# Patient Record
Sex: Female | Born: 1940 | Race: White | Hispanic: No | Smoking: Former smoker
Health system: Southern US, Community
[De-identification: ages and names within clinical notes are randomized; demographics above are authoritative.]

## PROBLEM LIST (undated history)

## (undated) DIAGNOSIS — E785 Hyperlipidemia, unspecified: Secondary | ICD-10-CM

## (undated) DIAGNOSIS — I82409 Acute embolism and thrombosis of unspecified deep veins of unspecified lower extremity: Secondary | ICD-10-CM

## (undated) DIAGNOSIS — R350 Frequency of micturition: Secondary | ICD-10-CM

## (undated) DIAGNOSIS — K5900 Constipation, unspecified: Secondary | ICD-10-CM

## (undated) DIAGNOSIS — F0390 Unspecified dementia without behavioral disturbance: Secondary | ICD-10-CM

## (undated) DIAGNOSIS — R55 Syncope and collapse: Secondary | ICD-10-CM

## (undated) DIAGNOSIS — I639 Cerebral infarction, unspecified: Secondary | ICD-10-CM

## (undated) DIAGNOSIS — I10 Essential (primary) hypertension: Secondary | ICD-10-CM

## (undated) DIAGNOSIS — I2510 Atherosclerotic heart disease of native coronary artery without angina pectoris: Secondary | ICD-10-CM

## (undated) DIAGNOSIS — R32 Unspecified urinary incontinence: Secondary | ICD-10-CM

## (undated) HISTORY — DX: Cerebral infarction, unspecified: I63.9

## (undated) HISTORY — DX: Hyperlipidemia, unspecified: E78.5

## (undated) HISTORY — PX: CORONARY ANGIOPLASTY WITH STENT PLACEMENT: SHX49

## (undated) HISTORY — DX: Essential (primary) hypertension: I10

## (undated) HISTORY — PX: OTHER SURGICAL HISTORY: SHX169

## (undated) HISTORY — DX: Atherosclerotic heart disease of native coronary artery without angina pectoris: I25.10

---

## 1998-04-01 ENCOUNTER — Ambulatory Visit (HOSPITAL_COMMUNITY): Admission: RE | Admit: 1998-04-01 | Discharge: 1998-04-01 | Payer: Self-pay | Admitting: Family Medicine

## 1998-04-01 ENCOUNTER — Encounter: Payer: Self-pay | Admitting: Family Medicine

## 1998-05-01 DIAGNOSIS — I2510 Atherosclerotic heart disease of native coronary artery without angina pectoris: Secondary | ICD-10-CM

## 1998-05-01 HISTORY — DX: Atherosclerotic heart disease of native coronary artery without angina pectoris: I25.10

## 1999-04-14 ENCOUNTER — Ambulatory Visit (HOSPITAL_COMMUNITY): Admission: RE | Admit: 1999-04-14 | Discharge: 1999-04-15 | Payer: Self-pay | Admitting: Cardiology

## 1999-09-15 ENCOUNTER — Encounter: Payer: Self-pay | Admitting: *Deleted

## 1999-09-15 ENCOUNTER — Ambulatory Visit (HOSPITAL_COMMUNITY): Admission: RE | Admit: 1999-09-15 | Discharge: 1999-09-15 | Payer: Self-pay | Admitting: *Deleted

## 1999-09-21 ENCOUNTER — Ambulatory Visit (HOSPITAL_COMMUNITY): Admission: RE | Admit: 1999-09-21 | Discharge: 1999-09-21 | Payer: Self-pay | Admitting: *Deleted

## 1999-09-21 ENCOUNTER — Encounter: Payer: Self-pay | Admitting: *Deleted

## 1999-10-10 ENCOUNTER — Other Ambulatory Visit: Admission: RE | Admit: 1999-10-10 | Discharge: 1999-10-10 | Payer: Self-pay | Admitting: Obstetrics and Gynecology

## 1999-10-27 ENCOUNTER — Encounter (INDEPENDENT_AMBULATORY_CARE_PROVIDER_SITE_OTHER): Payer: Self-pay | Admitting: Specialist

## 1999-10-27 ENCOUNTER — Ambulatory Visit (HOSPITAL_COMMUNITY): Admission: RE | Admit: 1999-10-27 | Discharge: 1999-10-27 | Payer: Self-pay | Admitting: Obstetrics and Gynecology

## 2000-01-10 ENCOUNTER — Encounter: Payer: Self-pay | Admitting: Emergency Medicine

## 2000-01-10 ENCOUNTER — Emergency Department (HOSPITAL_COMMUNITY): Admission: EM | Admit: 2000-01-10 | Discharge: 2000-01-10 | Payer: Self-pay | Admitting: Emergency Medicine

## 2000-03-19 ENCOUNTER — Encounter: Admission: RE | Admit: 2000-03-19 | Discharge: 2000-03-19 | Payer: Self-pay | Admitting: *Deleted

## 2000-03-19 ENCOUNTER — Encounter: Payer: Self-pay | Admitting: *Deleted

## 2000-12-26 ENCOUNTER — Encounter: Payer: Self-pay | Admitting: Family Medicine

## 2000-12-26 ENCOUNTER — Ambulatory Visit (HOSPITAL_COMMUNITY): Admission: RE | Admit: 2000-12-26 | Discharge: 2000-12-26 | Payer: Self-pay | Admitting: Family Medicine

## 2001-04-10 ENCOUNTER — Ambulatory Visit (HOSPITAL_COMMUNITY): Admission: RE | Admit: 2001-04-10 | Discharge: 2001-04-10 | Payer: Self-pay | Admitting: Family Medicine

## 2001-04-10 ENCOUNTER — Encounter: Payer: Self-pay | Admitting: Family Medicine

## 2002-06-30 ENCOUNTER — Other Ambulatory Visit: Admission: RE | Admit: 2002-06-30 | Discharge: 2002-06-30 | Payer: Self-pay | Admitting: Family Medicine

## 2002-07-10 ENCOUNTER — Ambulatory Visit (HOSPITAL_COMMUNITY): Admission: RE | Admit: 2002-07-10 | Discharge: 2002-07-10 | Payer: Self-pay | Admitting: Family Medicine

## 2002-07-10 ENCOUNTER — Encounter: Payer: Self-pay | Admitting: Family Medicine

## 2004-07-11 ENCOUNTER — Ambulatory Visit (HOSPITAL_COMMUNITY): Admission: RE | Admit: 2004-07-11 | Discharge: 2004-07-11 | Payer: Self-pay | Admitting: Family Medicine

## 2005-04-17 ENCOUNTER — Other Ambulatory Visit: Admission: RE | Admit: 2005-04-17 | Discharge: 2005-04-17 | Payer: Self-pay | Admitting: Family Medicine

## 2005-05-16 ENCOUNTER — Encounter (INDEPENDENT_AMBULATORY_CARE_PROVIDER_SITE_OTHER): Payer: Self-pay | Admitting: *Deleted

## 2005-05-16 ENCOUNTER — Ambulatory Visit (HOSPITAL_BASED_OUTPATIENT_CLINIC_OR_DEPARTMENT_OTHER): Admission: RE | Admit: 2005-05-16 | Discharge: 2005-05-16 | Payer: Self-pay | Admitting: Otolaryngology

## 2006-07-04 ENCOUNTER — Ambulatory Visit: Admission: RE | Admit: 2006-07-04 | Discharge: 2006-07-04 | Payer: Self-pay | Admitting: Family Medicine

## 2006-07-04 ENCOUNTER — Ambulatory Visit: Payer: Self-pay | Admitting: Vascular Surgery

## 2006-08-13 ENCOUNTER — Ambulatory Visit: Payer: Self-pay | Admitting: Vascular Surgery

## 2006-11-12 ENCOUNTER — Ambulatory Visit: Payer: Self-pay | Admitting: Vascular Surgery

## 2006-12-12 ENCOUNTER — Ambulatory Visit: Payer: Self-pay | Admitting: Vascular Surgery

## 2006-12-19 ENCOUNTER — Ambulatory Visit: Payer: Self-pay | Admitting: Vascular Surgery

## 2007-02-13 ENCOUNTER — Ambulatory Visit: Payer: Self-pay | Admitting: Vascular Surgery

## 2007-02-20 ENCOUNTER — Ambulatory Visit: Payer: Self-pay | Admitting: Vascular Surgery

## 2007-04-05 ENCOUNTER — Ambulatory Visit: Payer: Self-pay | Admitting: Vascular Surgery

## 2008-05-01 DIAGNOSIS — I639 Cerebral infarction, unspecified: Secondary | ICD-10-CM

## 2008-05-01 HISTORY — DX: Cerebral infarction, unspecified: I63.9

## 2008-05-16 ENCOUNTER — Encounter (INDEPENDENT_AMBULATORY_CARE_PROVIDER_SITE_OTHER): Payer: Self-pay

## 2008-05-16 ENCOUNTER — Inpatient Hospital Stay (HOSPITAL_COMMUNITY): Admission: EM | Admit: 2008-05-16 | Discharge: 2008-05-20 | Payer: Self-pay | Admitting: Emergency Medicine

## 2008-05-18 ENCOUNTER — Encounter (INDEPENDENT_AMBULATORY_CARE_PROVIDER_SITE_OTHER): Payer: Self-pay

## 2008-05-18 ENCOUNTER — Ambulatory Visit: Payer: Self-pay | Admitting: Physical Medicine & Rehabilitation

## 2008-05-19 ENCOUNTER — Encounter: Payer: Self-pay | Admitting: Cardiovascular Disease

## 2008-05-20 ENCOUNTER — Ambulatory Visit: Payer: Self-pay | Admitting: Physical Medicine & Rehabilitation

## 2008-05-20 ENCOUNTER — Inpatient Hospital Stay (HOSPITAL_COMMUNITY)
Admission: RE | Admit: 2008-05-20 | Discharge: 2008-05-28 | Payer: Self-pay | Admitting: Physical Medicine & Rehabilitation

## 2008-06-04 ENCOUNTER — Encounter
Admission: RE | Admit: 2008-06-04 | Discharge: 2008-09-02 | Payer: Self-pay | Admitting: Physical Medicine & Rehabilitation

## 2008-07-28 ENCOUNTER — Encounter
Admission: RE | Admit: 2008-07-28 | Discharge: 2008-08-03 | Payer: Self-pay | Admitting: Physical Medicine & Rehabilitation

## 2008-08-03 ENCOUNTER — Ambulatory Visit: Payer: Self-pay | Admitting: Physical Medicine & Rehabilitation

## 2008-09-25 ENCOUNTER — Encounter: Admission: RE | Admit: 2008-09-25 | Discharge: 2008-09-25 | Payer: Self-pay | Admitting: Orthopedic Surgery

## 2008-09-28 ENCOUNTER — Emergency Department (HOSPITAL_COMMUNITY): Admission: EM | Admit: 2008-09-28 | Discharge: 2008-09-28 | Payer: Self-pay | Admitting: Emergency Medicine

## 2008-12-25 ENCOUNTER — Ambulatory Visit (HOSPITAL_COMMUNITY): Admission: RE | Admit: 2008-12-25 | Discharge: 2008-12-25 | Payer: Self-pay | Admitting: Orthopedic Surgery

## 2009-12-01 ENCOUNTER — Encounter: Admission: RE | Admit: 2009-12-01 | Discharge: 2009-12-01 | Payer: Self-pay | Admitting: Neurology

## 2010-07-20 ENCOUNTER — Telehealth: Payer: Self-pay | Admitting: *Deleted

## 2010-07-20 NOTE — Telephone Encounter (Signed)
Pt called and requesting a 56month OV.  This was made for 08/12/10 @1330 

## 2010-07-22 ENCOUNTER — Other Ambulatory Visit: Payer: Self-pay | Admitting: *Deleted

## 2010-07-22 DIAGNOSIS — E785 Hyperlipidemia, unspecified: Secondary | ICD-10-CM

## 2010-07-22 DIAGNOSIS — I10 Essential (primary) hypertension: Secondary | ICD-10-CM

## 2010-07-22 MED ORDER — METOPROLOL TARTRATE 25 MG PO TABS: 25.0000 mg | ORAL_TABLET | Freq: Two times a day (BID) | ORAL | Status: DC

## 2010-07-22 MED ORDER — ATORVASTATIN CALCIUM 40 MG PO TABS: 40.0000 mg | ORAL_TABLET | Freq: Every day | ORAL | Status: DC

## 2010-07-22 NOTE — Telephone Encounter (Signed)
REFILL PER FAX FROM PHARMACY  

## 2010-07-29 ENCOUNTER — Other Ambulatory Visit: Payer: Self-pay | Admitting: Cardiovascular Disease

## 2010-07-29 DIAGNOSIS — E7800 Pure hypercholesterolemia, unspecified: Secondary | ICD-10-CM

## 2010-07-29 DIAGNOSIS — I10 Essential (primary) hypertension: Secondary | ICD-10-CM

## 2010-08-06 LAB — CBC
HCT: 43.3 % (ref 36.0–46.0)
Platelets: 248 10*3/uL (ref 150–400)
RBC: 4.4 MIL/uL (ref 3.87–5.11)
RDW: 13.1 % (ref 11.5–15.5)

## 2010-08-06 LAB — URINE MICROSCOPIC-ADD ON

## 2010-08-06 LAB — BASIC METABOLIC PANEL
Creatinine, Ser: 0.64 mg/dL (ref 0.4–1.2)
GFR calc Af Amer: >60 mL/min (ref >60)
Glucose, Bld: 96 mg/dL (ref 70–99)
Potassium: 4.6 mEq/L (ref 3.5–5.1)
Sodium: 142 mEq/L (ref 135–145)

## 2010-08-06 LAB — URINALYSIS, ROUTINE W REFLEX MICROSCOPIC
Ketones, ur: NEGATIVE mg/dL
Nitrite: NEGATIVE
Specific Gravity, Urine: 1.03 (ref 1.005–1.030)
Urobilinogen, UA: 0.2 mg/dL (ref 0.0–1.0)
pH: 5 (ref 5.0–8.0)

## 2010-08-06 LAB — DIFFERENTIAL
Basophils Absolute: 0.1 10*3/uL (ref 0.0–0.1)
Basophils Relative: 1 % (ref 0–1)
Eosinophils Absolute: 0.2 10*3/uL (ref 0.0–0.7)
Lymphs Abs: 2.4 10*3/uL (ref 0.7–4.0)
Neutrophils Relative %: 60 % (ref 43–77)

## 2010-08-09 LAB — CBC
MCHC: 34.5 g/dL (ref 30.0–36.0)
MCV: 95.5 fL (ref 78.0–100.0)
WBC: 7.8 10*3/uL (ref 4.0–10.5)

## 2010-08-09 LAB — POCT CARDIAC MARKERS
CKMB, poc: 2.1 ng/mL (ref 1.0–8.0)
Myoglobin, poc: 73.5 ng/mL (ref 12–200)
Troponin i, poc: <0.05 ng/mL (ref 0.00–0.09)

## 2010-08-09 LAB — DIFFERENTIAL
Basophils Absolute: 0 10*3/uL (ref 0.0–0.1)
Basophils Relative: 0 % (ref 0–1)
Monocytes Absolute: 0.4 10*3/uL (ref 0.1–1.0)
Neutrophils Relative %: 74 % (ref 43–77)

## 2010-08-09 LAB — BASIC METABOLIC PANEL
Calcium: 9.5 mg/dL (ref 8.4–10.5)
Chloride: 107 mEq/L (ref 96–112)
GFR calc Af Amer: >60 mL/min (ref >60)
Potassium: 4 mEq/L (ref 3.5–5.1)

## 2010-08-09 LAB — PROTIME-INR: INR: 1 (ref 0.00–1.49)

## 2010-08-09 LAB — APTT: aPTT: 27 seconds (ref 24–37)

## 2010-08-11 ENCOUNTER — Encounter: Payer: Self-pay | Admitting: Cardiovascular Disease

## 2010-08-11 DIAGNOSIS — I10 Essential (primary) hypertension: Secondary | ICD-10-CM | POA: Insufficient documentation

## 2010-08-11 DIAGNOSIS — I2510 Atherosclerotic heart disease of native coronary artery without angina pectoris: Secondary | ICD-10-CM | POA: Insufficient documentation

## 2010-08-11 DIAGNOSIS — E785 Hyperlipidemia, unspecified: Secondary | ICD-10-CM | POA: Insufficient documentation

## 2010-08-11 DIAGNOSIS — I63312 Cerebral infarction due to thrombosis of left middle cerebral artery: Secondary | ICD-10-CM | POA: Insufficient documentation

## 2010-08-12 ENCOUNTER — Ambulatory Visit (INDEPENDENT_AMBULATORY_CARE_PROVIDER_SITE_OTHER): Payer: Medicare Other | Admitting: Cardiovascular Disease

## 2010-08-12 ENCOUNTER — Encounter: Payer: Self-pay | Admitting: Cardiovascular Disease

## 2010-08-12 VITALS — BP 142/86 | HR 66 | Ht 62.0 in | Wt 144.6 lb

## 2010-08-12 DIAGNOSIS — E785 Hyperlipidemia, unspecified: Secondary | ICD-10-CM

## 2010-08-12 DIAGNOSIS — I639 Cerebral infarction, unspecified: Secondary | ICD-10-CM

## 2010-08-12 DIAGNOSIS — I6350 Cerebral infarction due to unspecified occlusion or stenosis of unspecified cerebral artery: Secondary | ICD-10-CM

## 2010-08-12 DIAGNOSIS — I2510 Atherosclerotic heart disease of native coronary artery without angina pectoris: Secondary | ICD-10-CM

## 2010-08-12 LAB — BASIC METABOLIC PANEL
Chloride: 104 mEq/L (ref 96–112)
Potassium: 4.6 mEq/L (ref 3.5–5.1)

## 2010-08-12 LAB — LIPID PANEL
Cholesterol: 149 mg/dL (ref 0–200)
LDL Cholesterol: 68 mg/dL (ref 0–99)
Total CHOL/HDL Ratio: 3

## 2010-08-12 LAB — HEPATIC FUNCTION PANEL
ALT: 14 U/L (ref 0–35)
AST: 24 U/L (ref 0–37)
Bilirubin, Direct: 0.2 mg/dL (ref 0.0–0.3)
Total Bilirubin: 1.3 mg/dL — ABNORMAL HIGH (ref 0.3–1.2)

## 2010-08-12 NOTE — Assessment & Plan Note (Signed)
She status post PTCA and stenting in the past. She's not had any further episodes of chest pain or shortness of breath.

## 2010-08-12 NOTE — Progress Notes (Signed)
[name] Date of Birth  March 11, 1941 [facility] / [facility] [address]     [address] [place], [place]  [address] [phone]  Fax  [phone]  History of Present Illness:  Conner seems to be doing quite well. I saw year ago. Her speech seems to have gradually and steadily improved.  She's not had any episodes of chest pain or shortness of breath.  Current Outpatient Prescriptions on File Prior to Visit  Medication Sig Dispense Refill  . clopidogrel (PLAVIX) 75 MG tablet Take 75 mg by mouth daily.        Marland Kitchen LIPITOR 40 MG tablet TAKE 1 TABLET BY MOUTH EVERY DAY  90 tablet  4  . lisinopril (PRINIVIL,ZESTRIL) 10 MG tablet Take 10 mg by mouth daily.        . metoprolol tartrate (LOPRESSOR) 25 MG tablet TAKE 1 TABLET BY MOUTH TWICE A DAY  180 tablet  3    No Known Allergies  Past Medical History  Diagnosis Date  . Hypertension   . Hyperlipidemia   . CVA (cerebrovascular accident)   . Coronary artery disease 2000    STATUS POT PTCA ND STENTING OF THE LAD     History reviewed. No pertinent past surgical history.  History  Smoking status  . Former Smoker  . Quit date: 05/01/2008  Smokeless tobacco  . Not on file    History  Alcohol Use No    History reviewed. No pertinent family history.  Reviw of Systems:  Reviewed in the HPI.  All other systems are negative.  Physical Exam: BP 142/86  Pulse 66  Ht 5\' 2"  (1.575 m)  Wt 144 lb 9.6 oz (65.59 kg)  BMI 26.45 kg/m2 The patient is alert and oriented x 3.  The mood and affect are normal.  The skin is warm and dry.  Color is normal.  The HEENT exam reveals that the sclera are nonicteric.  The mucous membranes are moist.  The carotids are 2+ without bruits.  There is no thyromegaly.  There is no JVD.  The lungs are clear.  The chest wall is non tender.  The heart exam reveals a regular rate with a normal S1 and S2.  There are no murmurs, gallops, or rubs.  The PMI is not displaced.   Abdominal  exam reveals good bowel sounds.  There is no guarding or rebound.  There is no hepatosplenomegaly or tenderness.  There are no masses.  Exam of the legs reveal no clubbing, cyanosis, or edema.  The legs are without rashes.  The distal pulses are intact.  Cranial nerves II - XII are intact.  Motor and sensory functions are intact.  The gait is normal.  Assessment / Plan:

## 2010-08-12 NOTE — Assessment & Plan Note (Signed)
She is steadily improving with her speech.

## 2010-08-15 ENCOUNTER — Telehealth: Payer: Self-pay | Admitting: *Deleted

## 2010-08-15 LAB — COMPREHENSIVE METABOLIC PANEL
ALT: 34 U/L (ref 0–35)
ALT: 42 U/L — ABNORMAL HIGH (ref 0–35)
AST: 39 U/L — ABNORMAL HIGH (ref 0–37)
Albumin: 3.6 g/dL (ref 3.5–5.2)
Alkaline Phosphatase: 73 U/L (ref 39–117)
BUN: 22 mg/dL (ref 6–23)
CO2: 26 mEq/L (ref 19–32)
CO2: 27 mEq/L (ref 19–32)
Calcium: 9.3 mg/dL (ref 8.4–10.5)
Calcium: 9.6 mg/dL (ref 8.4–10.5)
Chloride: 107 mEq/L (ref 96–112)
Creatinine, Ser: 0.65 mg/dL (ref 0.4–1.2)
Creatinine, Ser: 0.73 mg/dL (ref 0.4–1.2)
Creatinine, Ser: 0.92 mg/dL (ref 0.4–1.2)
GFR calc Af Amer: >60 mL/min (ref >60)
GFR calc non Af Amer: >60 mL/min (ref >60)
GFR calc non Af Amer: >60 mL/min (ref >60)
Glucose, Bld: 176 mg/dL — ABNORMAL HIGH (ref 70–99)
Glucose, Bld: 98 mg/dL (ref 70–99)
Potassium: 4.1 mEq/L (ref 3.5–5.1)
Total Bilirubin: 1 mg/dL (ref 0.3–1.2)
Total Protein: 6.1 g/dL (ref 6.0–8.3)
Total Protein: 6.1 g/dL (ref 6.0–8.3)

## 2010-08-15 LAB — RAPID URINE DRUG SCREEN, HOSP PERFORMED
Amphetamines: NOT DETECTED
Barbiturates: NOT DETECTED
Benzodiazepines: NOT DETECTED
Cocaine: NOT DETECTED
Opiates: NOT DETECTED

## 2010-08-15 LAB — PROTIME-INR
INR: 0.9 (ref 0.00–1.49)
INR: 0.9 (ref 0.00–1.49)
Prothrombin Time: 12 seconds (ref 11.6–15.2)

## 2010-08-15 LAB — HOMOCYSTEINE: Homocysteine: 8.3 umol/L (ref 4.0–15.4)

## 2010-08-15 LAB — DRUGS OF ABUSE SCREEN W/O ALC, ROUTINE URINE
Amphetamine Screen, Ur: NEGATIVE
Barbiturate Quant, Ur: NEGATIVE
Creatinine,U: 140.1 mg/dL
Marijuana Metabolite: NEGATIVE
Propoxyphene: NEGATIVE

## 2010-08-15 LAB — URINALYSIS, ROUTINE W REFLEX MICROSCOPIC
Bilirubin Urine: NEGATIVE
Ketones, ur: NEGATIVE mg/dL
Protein, ur: NEGATIVE mg/dL
Urobilinogen, UA: 0.2 mg/dL (ref 0.0–1.0)

## 2010-08-15 LAB — CBC
HCT: 44.2 % (ref 36.0–46.0)
Hemoglobin: 15.1 g/dL — ABNORMAL HIGH (ref 12.0–15.0)
MCHC: 33.8 g/dL (ref 30.0–36.0)
MCHC: 34.3 g/dL (ref 30.0–36.0)
MCV: 95.7 fL (ref 78.0–100.0)
MCV: 96.4 fL (ref 78.0–100.0)
Platelets: 221 10*3/uL (ref 150–400)
Platelets: 243 10*3/uL (ref 150–400)
RBC: 4.62 MIL/uL (ref 3.87–5.11)
RDW: 12.8 % (ref 11.5–15.5)
RDW: 13 % (ref 11.5–15.5)
RDW: 13.2 % (ref 11.5–15.5)
WBC: 8.3 10*3/uL (ref 4.0–10.5)

## 2010-08-15 LAB — LIPID PANEL
Cholesterol: 148 mg/dL (ref 0–200)
LDL Cholesterol: 87 mg/dL (ref 0–99)
Triglycerides: 112 mg/dL (ref <150)
VLDL: 22 mg/dL (ref 0–40)

## 2010-08-15 LAB — CARDIAC PANEL(CRET KIN+CKTOT+MB+TROPI)
Relative Index: 3.5 — ABNORMAL HIGH (ref 0.0–2.5)
Total CK: 114 U/L (ref 7–177)
Troponin I: <0.01 ng/mL (ref 0.00–0.06)

## 2010-08-15 LAB — DIFFERENTIAL
Basophils Relative: 1 % (ref 0–1)
Eosinophils Absolute: 0.2 10*3/uL (ref 0.0–0.7)
Eosinophils Absolute: 0.2 10*3/uL (ref 0.0–0.7)
Lymphs Abs: 3 10*3/uL (ref 0.7–4.0)
Lymphs Abs: 3.2 10*3/uL (ref 0.7–4.0)
Monocytes Relative: 7 % (ref 3–12)
Neutro Abs: 3.3 10*3/uL (ref 1.7–7.7)
Neutrophils Relative %: 47 % (ref 43–77)
Neutrophils Relative %: 57 % (ref 43–77)

## 2010-08-15 LAB — GLUCOSE, CAPILLARY
Glucose-Capillary: 121 mg/dL — ABNORMAL HIGH (ref 70–99)
Glucose-Capillary: 142 mg/dL — ABNORMAL HIGH (ref 70–99)
Glucose-Capillary: 147 mg/dL — ABNORMAL HIGH (ref 70–99)
Glucose-Capillary: 196 mg/dL — ABNORMAL HIGH (ref 70–99)
Glucose-Capillary: 87 mg/dL (ref 70–99)
Glucose-Capillary: 90 mg/dL (ref 70–99)

## 2010-08-15 LAB — HEMOGLOBIN A1C: Mean Plasma Glucose: 126 mg/dL

## 2010-08-15 LAB — URINE MICROSCOPIC-ADD ON

## 2010-08-15 LAB — CK TOTAL AND CKMB (NOT AT ARMC): Total CK: 133 U/L (ref 7–177)

## 2010-08-15 NOTE — Telephone Encounter (Signed)
Message copied by Mahalia Longest on Mon Aug 15, 2010 11:53 AM ------      Message from: [place], [place]      Created: Fri Aug 12, 2010  4:53 PM       Randie Heinz

## 2010-08-15 NOTE — Telephone Encounter (Signed)
Patient called with lab results. Pt verbalized understanding. Jodette Sindee Stucker RN  

## 2010-09-13 NOTE — Procedures (Signed)
LOWER EXTREMITY VENOUS REFLUX EXAM   INDICATION:  Bilateral varicose veins for many years.  The patient  states left leg is worse than the right.   EXAM:  Using color-flow imaging and pulse Doppler spectral analysis, the  right/and left common femoral, superficial femoral, popliteal, posterior  tibial, greater and lesser saphenous veins are evaluated.  There is no  evidence suggesting deep venous insufficiency in the right/left lower  extremity.   The right/left saphenofemoral junction are not competent.  The  right/left GSV are not competent with the caliber as described below.   The left proximal short saphenous vein demonstrates competency.   The right lesser saphenous vein was not compressible.   GSV Diameter (used if found to be incompetent only) Right  Left  SFJ                                       .79cm    1.15cm  Proximal Greater Saphenous Vein           0.58 cm  .64 cm  Proximal-to-mid-thigh                     0.59 cm  .65 cm  Mid thigh                                 0.49 cm  .56 cm  Mid-distal thigh                          cm       cm  Distal thigh                              cm       cm  Knee                                      cm       cm   IMPRESSION:  1. Right/Left greater saphenous vein reflux is identified with the      caliber ranging from 0.56 cm to 1.15 cm knee to groin.  2. The right/left greater saphenous veins are not aneurysmal.  3. The right/left greater saphenous veins are not tortuous.  4. There is no evidence of significant deep venous insufficiency in      the left lower extremity.  5. The deep venous system is competent.  6. The left lesser saphenous vein is competent.  7. The right lesser saphenous vein is noncompressible.   ___________________________________________  Larina Earthly, M.D.   DP/MEDQ  D:  11/12/2006  T:  11/12/2006  Job:  [id number]

## 2010-09-13 NOTE — Assessment & Plan Note (Signed)
[name] is back regarding her left PCA stroke.  She has been home with her  family and doing fairly well.  There are still some problems with  headaches, although it seems to be decreasing in 1 or 2 week.  She uses  Tylenol and ibuprofen at home and this seems to help those.  Sleep is  fair overall.  She does have some anxiety over her child's care who has  CP, and the fact that others are helping with the son's care more so  than in the past.  The patient has ongoing visual loss.  Has some  decrease of taste and smell as well.  She denies pain.  She does have a  cyst over the right shoulder that is still present.   REVIEW OF SYSTEMS:  Notable for dizziness, anxiety, weakness, loss of  taste, occasionally high sugars.  Other pertinent positives are above  and full review is in the written health and history section of the  chart.   SOCIAL HISTORY:  The patient is widowed, living with her daughter.   PHYSICAL EXAMINATION:  VITAL SIGNS:  Blood pressure 127/56, pulse is 61,  respiratory rate 18, and she is sating 96% on room air.  GENERAL:  The patient is pleasant, alert, and oriented x3.  Affect  generally bright and appropriate.  Gait is shuffling.  She still has  word-finding deficits and some receptive language deficits as well.  She  is able to follow all one-step commands.  She is unable to read.  She  has dense right homonymous hemianopsia still as well.  Strength is  generally 5/5 in all 4 extremities with normal sensation, fair to normal  coordination.  There is a three-quarter inch cyst over the right  chromium area still present and not painful.  The patient walked me  without any difficulty today.  Both with normal gait and with heel-to-  toe gait patterns.  HEART:  Regular.  CHEST:  Clear.  ABDOMEN:  Soft and nontender.   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   PLAN:  1. The patient is doing very  well at this point.  I think she can      progress to an independent living type of environment with bit more      progression in her language skills.  I also need to talk with      Speech Therapy more in this regard going forward.  She will      continue with her outpatient Speech Therapy.  I would like to see      her move to [facility] outpatient speech program once she completes      therapy at [facility].  I did talk with the daughter about      liberalizing her activities at home to her more from an independent      standpoint.  I think eventually she could be home alone.  2. Continue Tylenol for breakthrough headaches.  I would discourage      use of ibuprofen due to the fact that she is on Plavix.  3. I will see her back in 6 months time, not sooner depending on her      course.       Ranelle Oyster, M.D.  Electronically Signed     ZTS/MedQ  D:  08/03/2008 12:39:20  T:  08/04/2008 00:38:13  Job #:  [id number]   cc:  Vesta Mixer, M.D.  Fax: [phone]

## 2010-09-13 NOTE — Op Note (Signed)
NAMEROSENDA, GEFFRARD                ACCOUNT NO.:  [id number]   MEDICAL RECORD NO.:  [id number]          PATIENT TYPE:  AMB   LOCATION:  SDS                          FACILITY:  [facility]   PHYSICIAN:  Almedia Balls. Ranell Patrick, M.D. DATE OF BIRTH:  1941/03/17   DATE OF PROCEDURE:  12/25/2008  DATE OF DISCHARGE:                               OPERATIVE REPORT   PREOPERATIVE DIAGNOSES:  Right shoulder acromioclavicular joint  arthritis as well as ganglion cyst from the acromioclavicular joint,  symptomatic.   PREOPERATIVE DIAGNOSES:  Right shoulder acromioclavicular joint  arthritis as well as ganglion cyst from the acromioclavicular joint,  symptomatic as well as a proximal biceps tear and a rotator cuff tear.   PROCEDURES PERFORMED:  Right shoulder arthroscopy with extensive intra-  articular debridement of torn superior labrum anterior-posterior,  arthroscopic biceps tenotomy, arthroscopic subacromial decompression,  mini-open rotator cuff repair and biceps tenodesis in the groove using  suture repair and open distal clavicle resection with removal of large  ganglion cyst about the acromioclavicular joint   ATTENDING SURGEON:  Almedia Balls. Ranell Patrick, MD   FIRST ASSISTANT:  Donnie Coffin. Dixon, PA-C   ANESTHESIA:  General anesthesia plus interscalene block anesthesia was  used.   ESTIMATED BLOOD LOSS:  Minimal.   FLUID REPLACEMENT:  1000 mL crystalloid.   INSTRUMENT COUNTS:  Correct.   COMPLICATIONS.:  There were no complications.   Perioperative antibiotics were given.   INDICATIONS:  The patient is a 70 year old female with a gradual  enlarging cyst over the [facility] joint right shoulder.  Her shoulder has  become more symptomatic with mostly on top pain.  She does have pain at  night, pain at rest, and pain interferes with ADLs.  The patient desires  operative excision of a ganglion cyst as well as removal of her distal  clavicle and arthroscopy of her shoulder.  Informed consent obtained.   DESCRIPTION OF PROCEDURE:  After an adequate level of anesthesia  achieved, the patient was positioned in the modified beach chair  position.  The right shoulder was sterilely prepped and draped in usual  manner.  After sterile prep and drape of the shoulder, we examined the  shoulder under anesthesia, and the patient was noted to have full  passive range of motion with no stiffness and no instability.  We then  sterilely prepped and draped the shoulder in usual manner.  We entered  into the shoulder using standard portals including anterior, posterior,  and lateral portals.  We identified severely torn superior labrum and  biceps, proximal biceps to the point where it was almost completely torn  until we performed a biceps tenotomy releasing the biceps tendon and  then performed a labral debridement using basket forceps and motorized  shaver to the superior labrum.  The rotator cuff was also torn and this  appeared to be an U-shaped tear involving supraspinatus.  The  subscapularis had a partial-thickness tear, which we debrided.  The  remainder of the joint showed moderate amount of chondromalacia and  synovitis, but otherwise the glenohumeral joint looked to be in good  shape.  We then placed the scope in the subacromial space, and thorough  bursectomy and acromioplasty was performed creating a type 1 acromial  shape with a thorough decompression of the rotator cuff outlet.  The  cuff was torn as seen from the bursal surface.  At this point, we  concluded the arthroscopic portion of the surgery, we made a Saber  incision overlying the [facility] joint and a large mass, which was adjacent to  the [facility] joint.  Dissection down through the subcutaneous tissues.  We  identified the cystic material and dissected around that with a Bovie  and then removed the large cyst in its entirety.  This had a benign  appearance and had normal gelatinous fluid consistent with a ganglion  cyst.  There were no  malignant features and thus, we decided not to send  it to Pathology.  Given its intermittent relationship with the joint and  its clear ganglion features, at this point we performed a distal  clavicle resection, removing 3 mm of distal clavicle with an oscillating  saw.  We thoroughly irrigated the AC interval on the cystic area and  then applied bone wax to cut in the clavicle.  Next, we closed the  deltotrapezial fascia with interrupted figure-of-eight 0 Vicryl suture,  followed by 2-0 Vicryl subcutaneous closure, and 4-0 Monocryl for skin.  We made a separate incision, a mini open incision, start at the  anterolateral border of the acromion extending down about 3 cm.  Dissection was down through the subcutaneous tissues.  We split the  deltoid and its raphe between the anterior and lateral heads.  We then  identified the biceps tendon within the biceps sheath.  We took a 0  Vicryl suture and oversewed that with 0 Vicryl sewing basically doing a  soft tissue tenodesis of the biceps tendon.  Once we had finished that  we then approached the torn rotator cuff.  This was a supraspinatus  tear, which was an U-shaped tear, performed a margin convergence with  two #2 FiberWire sutures side-to-side and then a single 5.5 Bio-  Corkscrew anchor in the floor of the rotator cuff footprint.  We scuffed  up the footprint first with a rongeur until we got some bleeding bone  and then went ahead and took that preloaded 0 Vicryl suture and did a  mattress side-to-side repair recreating that footprint.  We oversewed  that with 0 Vicryl suture.  We were happy with a low profile  nontensioned repair, took the shoulder through a full range of motion,  no impingement was noted.  We thoroughly irrigated and closed the  deltoid with 0 Vicryl suture, followed by 2-0 Vicryl subcutaneous  closure, and 4-0 Monocryl for skin.  Steri-Strips applied followed by a  sterile dressing.  The patient tolerated the surgery  well.      Almedia Balls. Ranell Patrick, M.D.  Electronically Signed     SRN/MEDQ  D:  12/25/2008  T:  12/25/2008  Job:  [id number]

## 2010-09-13 NOTE — Discharge Summary (Signed)
NAMECAMILLA, [name] NO.:  [id number]   MEDICAL RECORD NO.:  [id number]          PATIENT TYPE:  IPS   LOCATION:  4036                         FACILITY:  [facility]   PHYSICIAN:  Ellwood Dense, M.D.   DATE OF BIRTH:  Oct 21, 1940   DATE OF ADMISSION:  05/20/2008  DATE OF DISCHARGE:  05/28/2008                               DISCHARGE SUMMARY   DISCHARGE DIAGNOSES:  1. Left posterior communicating artery infarction.  2. Hypertension.  3. Hyperlipidemia.  4. Tobacco abuse.  5. Subcutaneous Lovenox for deep vein thrombosis prophylaxis.   This is a 70 year old female with history of hypertension,  hyperlipidemia, and tobacco abuse admitted January 16, with altered  mental status and aphasia.  MRI showed a left PCA infarction with  involvement of the posterior and left insular cortex.  MRA negative for  stenosis.  Carotid Dopplers negative for internal carotid artery  stenosis.  Echocardiogram with normal left ventricular function.  No  thrombus.  Placed on Plavix therapy.  Subcutaneous Lovenox for deep vein  thrombosis prophylaxis.  Blood pressure monitored with Lopressor.  TEE  on January 19, negative for thrombus or PFO, no aortic stenosis.  The  patient was admitted for comprehensive rehab program.   PAST MEDICAL HISTORY:  See discharge diagnoses.   ALLERGIES:  None.   She does smoke approximately one-half to one pack per day.  No alcohol.   SOCIAL HISTORY:  Lives with handicap son who has cerebral palsy and she  is the main caregiver for him.  They live in a 2-level home, bedroom  downstairs, 4 steps to entry.  Local family plans to assist as needed.  Daughter and son-in-law provide care.   FUNCTIONAL HISTORY:  Prior to admission was independent and active.   FUNCTIONAL STATUS:  Upon admission to rehab services was 120 feet with  decreased balance and lean to the left.   MEDICATIONS PRIOR TO ADMISSION:  Metoprolol 25 mg twice daily as well as  Lipitor 40 mg  daily.   PHYSICAL EXAMINATION:  VITAL SIGNS:  Blood pressure 159/64, pulse 66,  temperature 97.9, and respirations 18.  GENERAL:  This was an alert female in no acute distress.  NEUROLOGIC:  Noted aphasia, decreased right spatial awareness.  Deep  tendon reflexes were 2+.  LUNGS:  Clear to auscultation.  CARDIAC:  Regular rate and rhythm.  ABDOMEN:  Soft and nontender.  Good bowel sounds.  EXTREMITIES:  Calves remained cool without swelling, erythema, and  nontender.   REHABILITATION HOSPITAL COURSE:  The patient was admitted to Inpatient  Rehab Services with therapies initiated on a 3-hour daily basis  consisting of physical therapy, occupational therapy, and rehabilitation  nursing.  The following issues were addressed during the patient's  rehabilitation stay.  Pertaining Ms. Housholder's left PCA infarction  remained stable, maintained on Plavix therapy.  Subcutaneous Lovenox  ongoing for deep vein thrombosis prophylaxis.  Noted aphasia, followed  by speech therapy.  She was able to communicate her basic needs.  However, it was advised the need for supervision for the patient's  safety.  Blood pressure was monitored  on Lopressor with no orthostatic  changes.  She was voiding without difficulty.  She continued on her  Crestor for hyperlipidemia.  She did have a history of tobacco abuse.  She received full counseling in regards to health and need for  discontinuation of any tobacco products.  It was questionable she will  be compliant with these requests.   The patient received weekly collaborative interdisciplinary team  conference to discuss estimated length of stay, family teaching, and  barriers to discharge.  She was overall supervision with no assistive  device for her mobility.  She did need some supervision secondary to her  aphasia.  She had no swallowing difficulties.  She was discharged to  home with family.   Latest labs showed a hemoglobin of 15, hematocrit 44, and  platelet  226,000.  Sodium 142, potassium 3.8, BUN 22, and creatinine 0.6.   DISCHARGE MEDICATIONS:  1. Lopressor 25 mg twice daily.  2. Crestor 20 mg daily.  3. Plavix 75 mg daily.  4. Tylenol as needed.   Activity as tolerated.  Supervision for safety.  No drinking, no  driving, and no smoking.   FOLLOWUP:  Dr. Ellwood Dense, Outpatient Rehab Services as advised.  Dr. Elease Hashimoto, medical management.      Mariam Dollar, P.A.    ______________________________  Ellwood Dense, M.D.    DA/MEDQ  D:  05/27/2008  T:  05/28/2008  Job:  [id number]   cc:   Ellwood Dense, M.D.  Vesta Mixer, M.D.

## 2010-09-13 NOTE — H&P (Signed)
[name], SCHNECK NO.:  [id number]   MEDICAL RECORD NO.:  [id number]          PATIENT TYPE:  EMS   LOCATION:  MAJO                         FACILITY:  [facility]   PHYSICIAN:  Leona Carry, MD        DATE OF BIRTH:  May 10, 1940   DATE OF ADMISSION:  05/16/2008  DATE OF DISCHARGE:                              HISTORY & PHYSICAL   HISTORY OF PRESENT ILLNESS:  [name] is a 70 year old brought in  about midnight, early on May 16, 2008, with a code stroke activated  for acute onset of difficulty with speech production.  She was last  talking to her daughter at 8 p.m., on May 15, 2008, and noted it to  be normal.  She documented some care she had given for a handicapped son  at 10 p.m., but no one talked with her.  At 10.30, she called her  daughter to say she was having difficulty and she was noted to have  problems with speech.  She has fluent aphasia with intermittent words  swallowed, substitutions, and some intermittent confusion in receptive.  Processing of commands as well.  Her family reports that over the last 6  months or 1 year, she has had increasing confusion and odd behavior.  She reports suspicion of a neighbor and some obsessive focus on concerns  that her neighbor is trying to harm her or monitoring her behavior.  The  family denies that she has ever reported any visual hallucinations.  She  had no evident weakness and has not complained of weakness or numbness.  The patient was unable to try and provide a correct history and most of  the history is from the patient's family.   ALLERGIES:  She has not been on any medication allergies.   MEDICATIONS:  Her family is unaware of any of her medications with the  exception of metoprolol.  They said that she used to take Lipitor, but  has been on something similar recently.   PAST MEDICAL HISTORY:  Notable for coronary artery disease and stents in  2001, uterine fibroids, and she has a hard-bony like  growth on her right  shoulder.   PAST SURGICAL HISTORY:  Notable for the cardiac stent and uterine  fibroids removal.   SOCIAL HISTORY:  She smoked a pack of cigarettes today, for at least 50  years.  Has no significant alcohol or recreational drug use.   FAMILY HISTORY:  The patient has a family history of coronary artery  disease.  Her mother is living in her 62s and recently had a stroke.  Her father died from myocardial infarction.  She has a son with cerebral  palsy.   PHYSICAL EXAMINATION:  GENERAL:  She was alert and cooperative.  CARDIOVASCULAR:  Regular heart rate and rhythm.  LUNGS:  Clear to auscultation.  ABDOMEN:  Revealed that the bowel sounds were positive and her abdomen  was soft and nontender.  MENTAL STATUS:  She was intermittently able to follow commands with some  intermittent fluency, but also some more substitutions and confusions.  She  was unable to find the words for some of the things she tried to  say.  She was very aware that she was having difficulty with speech  production and said things like  I am talking a lot of junk.  she was  angry to perseverate and asked me to repeatedly what do you want, what  do you want, when I ask her to do something.  At times, she would say  things like, I can't leave it, because I don't know how, without any  clear reference.  Cranial nerves:  Pupils are equal and reactive to  light.  Extraocular movements are intact.  She had no clear facial  droop.  There was no significant dysarthria.  Motor:  It was difficult  to get her to fully cooperate for exam or to follow our commands.  She  appeared to have 5/5 strength in the bilateral upper extremities.  She  also appeared to have 5/5 strength in the left lower extremity and  questioned where there some mild right lower extremity distal weakness.  The right foot was also cold to the touch.  Sensory:  She withdrew x4,  but was unable to give me a more specific exam.   Cerebellar:  She was  unable to mimic accents or follow commands for assessment.  Gait:  Superb.  Deep tendon reflexes were 2+ and symmetric in the upper and  lower extremities.  The plantar responses were flexor.   Head CT.  I reviewed and discussed with the craniology.  She has a left  parietal hyperdensity that appears to be circular with also some  hyperdensity in the surrounding tissue.  There are multiple round  hyperdensities in the right frontal lobe with potentially some very mild  mass effect.  It is possible that these are embolic influx, but they  would not be acute, as they have some significant hyperdensity.  I would  also consider the possibility of metastatic disease in a differential  diagnosis.   LABORATORY DATA:  Labs were pending at this time.   IMPRESSION:  Infarcts, likely embolic and subacute versus other  etiologies included a metastatic disease.  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   PLAN:  Admit to Neurology telemetry.  She will need an MRI with and  without gadolinium and an MRA.  Echocardiogram and carotid ultrasound.  Normal saline overnight.  She will have bed rest overnight.  I will hold  the metoprolol for now and allow her blood pressure to auto regulate.  We will not give any TPA as we are uncertain of the onset and she has  minimal defect with an NIH stroke score of 3.      Leona Carry, MD  Electronically Signed     Leona Carry, MD  Electronically Signed    JC/MEDQ  D:  05/16/2008  T:  05/16/2008  Job:  [id number]

## 2010-09-13 NOTE — Assessment & Plan Note (Signed)
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  She has the usual amount of erythema and mild bruising. She is her usual  animated self today with no new major difficulties. She underwent  limited duplex evaluation today and this reveals complete closure of her  saphenous vein with patency of her common femoral vein. I am quite  pleased with her initial result. We will see her again in six weeks for  continued follow up and discuss possible ablation of her right leg at  her choosing.   Larina Earthly, M.D.  Electronically Signed   TFE/MEDQ  D:  12/19/2006  T:  12/21/2006  Job:  [id number]   cc:   Tally Joe, M.D.  Vesta Mixer, M.D.

## 2010-09-13 NOTE — Discharge Summary (Signed)
[name], [name]                ACCOUNT NO.:  [id number]   MEDICAL RECORD NO.:  [id number]          PATIENT TYPE:  INP   LOCATION:  3033                         FACILITY:  [facility]   PHYSICIAN:  Pramod P. Pearlean Brownie, MD    DATE OF BIRTH:  [birth year]   DATE OF ADMISSION:  05/16/2008  DATE OF DISCHARGE:  05/20/2008                               DISCHARGE SUMMARY   DIAGNOSES AT THE TIME OF DISCHARGE:  1. 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  2. Coronary artery disease with stent placement in 2001.  3. Dyslipidemia.  4. Uterine fibroids.  5. Hard bony-like growth on her right shoulder.  6. Cigarette smoker for at least 50 years.   MEDICINES AT THE TIME OF DISCHARGE:  1. Metoprolol 25 mg b.i.d.  2. Lipitor 40 mg a day.  3. Actonel 35 mg a week.  4. Lovenox 40 mg a day.  5. Plavix 75 mg a day.   STUDIES PERFORMED:  1. CT of the brain on admission shows patchy areas of hypoattenuation,      right frontal lobe and focal hypotension in the left parietal lobe.  2. MRI of the brain as shows left posterior cerebral artery acute      infarct with involvement of the posterior left insula.  No      significant mass effect.  No evidence of hemorrhagic      transformation, chronic small vessel disease.  3. MRA of the neck shows degraded by motion artifact bilateral carotid      arteries, patent left vertebral artery, not well seen.  4. MRA of the head shows again moderate motion degraded.  Carotid      siphons and distal circle of Willis not seen.  Basilar artery and      probably right posterior cerebral artery are patent.  There is      attenuated flow in left posterior cerebral artery P3 segment, which      corresponds to acute infarct.  There is fetal origin right      posterior cerebral artery.  5. A 2-D echocardiogram shows normal ventricular systolic function.  6. Carotid Doppler.  No significant internal carotid artery stenosis.  7.  Transcranial Doppler completed, results pending.  8. Transesophageal echocardiogram shows normal left ventricular      function, normal left atrial appendage.  No atrial septal defect,      no patent foramen ovale by color Doppler or bubble contrast.  No      source of embolus.  9. EKG shows sinus rhythm, consider anterior infarct.   LABORATORY STUDIES:  CBC with hemoglobin 15.3, otherwise, normal CBC  with glucose 176, otherwise, normal.  Liver function tests normal except  for SGOT which is 40.  Cardiac enzymes with CK-MB 4.7, index 3.5,  troponin negative.  Urine drug screen negative.  Homocystine 8.3,  hemoglobin A1c 6.0.  Cholesterol 148, triglycerides 112, HDL 39, LDL 87.   HISTORY OF PRESENT ILLNESS:  [name] is a 70 year old Caucasian  female who was brought in  with sudden onset of acute difficulty with  speech production.  She was talking to her daughter at 8:00 p.m. on  May 15, 2008, and noted it to be normal.  She documented some care  sheet given for handicap sign at 10:00 p.m. but no one talk with her.  At 10:30 a.m., she called her daughter to say she was having difficulty  and she was noted to have speech problems.  She has some aphasia with  intermittent word substitution and confusion and was repetitive.  She  processes commands well.  Her family reports that over the last 6 months  to 1 year she has had increasing confusion and odd behavior.  She  reports suspicion of a neighbor and some matters to focus on concerns  that her neighbor is trying to harm her or monitor her behavior.  The  family denies she has ever reported any visual hallucinations.  She has  no evidence of hemiparesis or numbness or weakness.  The patient was  unable to try and provide a correct history and much of the history is  from the patient's family.  She was not felt to be a t-PA candidate, as  time of onset was unknown.  She was admitted to the hospital for further   evaluation.   HOSPITAL COURSE:  MRI did reveal acute left PCA infarct that was felt to  be embolic in nature, though complete workup was performed but no  embolic source found including a transesophageal echocardiogram.  She  needs outpatient followup for TCD emboli study.  She was evaluated by PT  and OT, felt to benefit from inpatient rehab and arrangements were made.  Back to the strokes, the patient was on aspirin prior to admission and  she wished changed to Plavix for secondary stroke prevention.  She knows  ongoing risk factor control by her primary care physician after  discharge.  At this point, she will be discharged on Lipitor as prior to  admission.   CONDITION AT DISCHARGE:  The patient is alert and oriented to person and  place, though she still has some nonfluent aphasia.  She will follow  commands.  Her gait is wide based.  She has no obvious drift in her  extremities though unsteady gait at times.   DISCHARGE PLAN:  1. Discharge to rehab for continuation of PT, OT, and speech therapy.  2. Plavix for secondary stroke prevention.  3. Ongoing risk factor control by primary care physician at followup      primary care physician within 1 month.  4. Follow up with Dr. Delia Heady in 2-3 months.  5. Outpatient TCD emboli monitoring and bubble study.  6. Consider IRIS trial.      [name], N.P.    ______________________________  [name]. Pearlean Brownie, MD    SB/MEDQ  D:  05/20/2008  T:  05/21/2008  Job:  [id number]   cc:   Vesta Mixer, M.D.

## 2010-09-13 NOTE — Assessment & Plan Note (Signed)
OFFICE VISIT   [name], [name]  DOB:  12/17/1940                                       11/12/2006  ZOXWR#:[id number]   The patient presents today for continued discussion of her severe  bilateral venous hypertension.  She has worn compression garments for 3  months.  Reports that this has not given her any relief.  She continues  to have discomfort with aching in both legs, left somewhat more than the  right, and also pain over the large tributary varicosities.  This is  interrupting her activities of daily living with pain and swelling  causing difficulty with caregiving for her handicapped son, who requires  lifting, cleaning, and caring for.  She has pain with prolonged sitting,  standing, and doing housework.  She underwent formal duplex today and  this reveals incontinence of her saphenous vein from the saphenofemoral  junction distally bilaterally.  Her small saphenous vein is small and  not compressible on the right, on the left it is competent.  I again  discussed options with this patient.  She is an excellent candidate for  left leg laser ablation and tributary stab phlebectomy for relief of her  pain.  I described this as an outpatient procedure under local  anesthesia.  She wishes to proceed with this as soon as possible once we  can get her service carrier assured at her convenience.  If she has much  more difficulty on her left leg, we will proceed with this and then  discuss future right leg ablation.   Larina Earthly, M.D.  Electronically Signed   TFE/MEDQ  D:  11/12/2006  T:  11/13/2006  Job:  [id number]   cc:   Tally Joe, M.D.  Vesta Mixer, M.D.

## 2010-09-13 NOTE — Assessment & Plan Note (Signed)
OFFICE VISIT   BRYNLIE, DAZA  DOB:  [birth year]                                       04/05/2007  ZOXWR#:[id number]   The patient presents today for followup of her bilateral laser ablation  stab phlebectomy.  She is quite pleased with her results and is her  usual animated self.  She has markedly reduced swelling and no  recurrence of her varicosities.  She is quite pleased with her result,  as am I.  She will see [place] on a p.r.n. basis.   Larina Earthly, M.D.  Electronically Signed   TFE/MEDQ  D:  04/05/2007  T:  04/08/2007  Job:  [id number]

## 2010-09-13 NOTE — H&P (Signed)
[name], [name] NO.:  [id number]   MEDICAL RECORD NO.:  [id number]          PATIENT TYPE:  IPS   LOCATION:  NA                           FACILITY:  [facility]   PHYSICIAN:  [name], M.D.   DATE OF BIRTH:  Jul 31, 1940   DATE OF ADMISSION:  DATE OF DISCHARGE:                              HISTORY & PHYSICAL   PRIMARY CARE PHYSICIAN:  [name], M.D.   HISTORY OF PRESENT ILLNESS:  [name] is a 70 year old Caucasian  female with history of hypertension and dyslipidemia along with tobacco  abuse.   The patient was admitted May 16, 2008 with altered mental status and  aphasia.  MRI study showed left posterior cerebral artery infarct with  involvement of the posterior left insular cortex.  MRA study was  negative for stenosis.  Carotid Dopplers showed no significant internal  carotid artery stenosis.  Echocardiogram showed normal left ventricular  function without thrombus.  The patient was placed on Plavix and subcu  Lovenox was added for DVT prophylaxis.  Speech therapy evaluation showed  that she could tolerate regular diet with thin liquids.  Blood pressures  been monitored on Lopressor.  A transesophageal echocardiogram May 19, 2008 was negative for thrombus or patent foramen ovale with no  aortic stenosis noted.   The patient was evaluated by the rehabilitation physicians and felt to  be an appropriate candidate for inpatient rehabilitation.   REVIEW OF SYSTEMS:  Positive for reflux.   PAST MEDICAL HISTORY:  1. Hypertension.  2. Coronary artery disease with prior PTCA.  3. Dyslipidemia.  4. History of uterine fibroid resection.   FAMILY HISTORY:  Positive for coronary artery disease and stroke.   SOCIAL HISTORY:  The patient normally lives with her handicapped son who  has cerebral palsy and needs 24-hour care.  The patient does not use  alcohol and does use tobacco at home.  There is a two-level home with  bedroom downstairs  and four steps to enter that the 2 of them live in.  A daughter and son-in-law are of caring for the cerebral handicapped son  at present and plan to have their home available for the patient at  discharge to live in at least temporarily.  The daughter works from the  home and can provide supervision.   FUNCTIONAL HISTORY PRIOR TO ADMISSION:  Independent and active caring  for her adult handicapped son.   PRESENT FUNCTIONAL STATUS:  Ambulation 120 feet with decreased balance  and lean to the left.  She is min assist with transfers with some  expressive aphasia.   ALLERGIES:  NO KNOWN DRUG ALLERGIES.   MEDICATIONS:  Prior to admission:  1. Metoprolol 25 mg b.i.d.  2. Lipitor 40 mg daily.   LABORATORY:  Recent hemoglobin was 15.3 with hematocrit of 45.2,  platelet count of 242,000 and white count 9.4.  Recent total cholesterol  is 148 with HDL cholesterol 39 and LDL cholesterol 87.  Most recent  sodium is 142, potassium 4.1, chloride 107, bicarbonate 28, BUN 16,  creatinine 0.7.   PHYSICAL EXAMINATION:  A reasonably well-appearing, middle-aged adult  small female seated up in a chair in no acute discomfort.  Blood  pressure 159/64 with pulse of 66, respirations 18, and temperature 97.9.  HEENT: Normocephalic, nontraumatic.  CARDIOVASCULAR:  Regular rate and rhythm.  S1-S2 without murmurs.  ABDOMEN:  Soft, nontender with positive bowel sounds.  LUNGS: Clear to auscultation bilaterally.  NEUROLOGIC: Alert and oriented x3.  Cranial nerves II-XII are intact  with tongue in midline and normal facial symmetry.  She does have  expressive more than receptive aphasia with some problems with word-  finding.  Bilateral upper extremity exam showed 4/5 strength throughout.  Bulk and tone were normal.  Sensation was slightly decreased to light  touch in the right upper extremity and coordination was slightly  decreased in the right compared to the left.  Lower extremity exam  showed 4/5  strength in hip flexion, knee extension and ankle  dorsiflexion.  The patient ambulates with contact guard assistance to  min assist in the exam room.   DIAGNOSES:  Status post left posterior cerebral artery infarct with mild  right-sided weakness and expressive more than expressive greater than  receptive aphasia.  1. Presently, the patient is admitted to receive collaborative and      interdisciplinary care between the physiatrist, rehab nursing staff      and therapy team.  2. The patient's level of medical complexity and substantial therapy      needs in context of that medical necessity cannot be provided at a      lesser intensity of care.  3. The patient has experienced substantial functional loss from her      baseline.  Judging by the patient's diagnosis, physical exam and      functional history, it is hoped with aggressive therapies that the      patient will make measurable gains functionally on the      rehabilitation unit such that she is able to return home with at      least supervision from family to begin with.  4. Physiatrist will provide 24-hour management of medical needs as      well as oversight of therapy plan/treatment and provide guidance as      appropriate regarding interaction of the 2.  5. Rehab nursing, 24 hours, will assist in management of bowel and      bladder and help integrate therapy concepts, techniques and      education.  6. Physical therapy will assess and treat for range of motion,      strengthening and bed mobility, transfers, pre-gait training, gait      training and equipment evaluation with goals of modified      independent.  7. Occupational therapy will assess and treat for range of motion,      strengthening, ADLs, cognitive/perceptional training, splinting and      equipment evaluation with goals of modified independent.  8. Speech therapy will assess and treat for dysphagia, oral motor      exercise, communication aids, aphasia and  vital stim with goals of      modified independent.  9. Case management and social worker will assess and treat for      psychosocial issues and discharge planning as appropriate.  10.Team conferences will be held weekly to establish goals, assess      progress and determine barriers to discharge.   The patient has demonstrated sufficient medical stability and exercise  capacity to tolerate at least 3  hours of therapy per day at least 5 days  per week.   ESTIMATED LENGTH OF STAY:  5-7 days.   PROGNOSIS:  Good/excellent.   MEDICAL PROBLEM LIST:  1. Hypertension with continuation of Lopressor 25 mg b.i.d.  2. Dyslipidemia with continuation of Crestor 20 mg daily.  3. Subcutaneous Lovenox for DVT prophylaxis.  4. Continuation of Plavix 75 mg p.o. daily for stroke prophylaxis.  5. Tobacco abuse with counseling and use of nicotine patch as      necessary.           ______________________________  [name], M.D.     DC/MEDQ  D:  05/20/2008  T:  05/20/2008  Job:  [id number]

## 2010-09-13 NOTE — Assessment & Plan Note (Signed)
OFFICE VISIT   [name], [name]  DOB:  [birth year]                                       02/20/2007  UEAVW#:[id number]   Patient presents today for a followup of her staged right laser ablation  and stab phlebectomy.  She had undergone prior left leg treatment.  She  is her usual animated self and has done quite well with minimal  difficulty and reports this was even more comfortable than her last  procedure.  She has mild bruising, and her stab phlebectomy sites are  all healing nicely.  She underwent limited venous duplex today in our  office, and it reveals closure of her saphenous vein throughout her  thigh up to the level just below her saphenofemoral junction.  Her  common femoral vein is widely patent without injury.  I am quite pleased  with her results, and we will see her again in six weeks for final  followup.   Larina Earthly, M.D.  Electronically Signed   TFE/MEDQ  D:  02/20/2007  T:  02/22/2007  Job:  [id number]

## 2010-09-16 NOTE — Op Note (Signed)
[facility]  Patient:    [name], [name]                       MRN: [id number] Proc. Date: 10/27/99 Adm. Date:  [id number] Disc. Date: [id number] Attending:  Malon Kindle CC:         [name] Modena. Dreiling, M.D.                           Operative Report  PREOPERATIVE DIAGNOSIS:  Thickened endometrium.  POSTOPERATIVE DIAGNOSIS:  Thickened endometrium with large endometrial polyp.  OPERATION:  Endocervical curettage, hysteroscopic removal of large endometrial polyp.  SURGEON:  Malachi Pro. Ambrose Mantle, M.D.  ANESTHESIA:  General.  DESCRIPTION OF OPERATION:  The patient was brought to the operating room and placed under satisfactory general anesthesia.  The vulva, vagina and perineum were prepped with Betadine solution.  A bib was placed under the buttocks to catch all of the sorbitol.  After the area was prepped with Betadine solution, the entire area was draped as a sterile field.  The cervix descended to the introitus with pressure.  The uterus was anterior, small.  The adnexa were free of masses.  The cervix was drawn into the operative field and was dilated so it would admit the biopsy hysteroscope.  On inserting the hysteroscope, it was apparent the patient had a polyp that extended all the way to the fundus and it would be too large to remove with the small biopsy forceps.  I removed the hysteroscope, inserted the polyp forceps and removed a large polyp, probably 3 cm x 1 cm x 1 cm.  I reintroduced the hysteroscope, found a couple more fragments of the polyp and removed them with the biopsy forceps and then inspected the right lateral lower uterine segment where there was a small arterial bleeder.  I considered inserting the resectoscope to cauterize this area; however, when I allowed the uterus to lose some of its distention and quit running fluid in and out, there was no bleeding, so I feel it will stop on its own.  I removed the hysteroscope, did  an endocervical curettage and terminated the procedure.  Blood loss was less than 10 cc.  Patient returned to the recovery room in satisfactory condition. DD:  10/27/99 TD:  10/28/99 Job: [id number]

## 2010-09-16 NOTE — Op Note (Signed)
[name], [name]                ACCOUNT NO.:  [id number]   MEDICAL RECORD NO.:  [id number]          PATIENT TYPE:  AMB   LOCATION:  DSC                          FACILITY:  [facility]   PHYSICIAN:  Christopher E. Ezzard Standing, M.D.DATE OF BIRTH:  1940/09/26   DATE OF PROCEDURE:  05/16/2005  DATE OF DISCHARGE:                                 OPERATIVE REPORT   PREOPERATIVE DIAGNOSIS:  Bilateral vocal cord polyps (Reinke's edema).   POSTOPERATIVE DIAGNOSIS:  Bilateral vocal cord polyps (Reinke's edema).   OPERATION PERFORMED:  Microlaryngoscopy with excision of bilateral vocal  cord polypoid changes.   SURGEON:  Kristine Garbe. Ezzard Standing, M.D.   ANESTHESIA:  General endotracheal.   COMPLICATIONS:  None.   INDICATIONS FOR PROCEDURE:  [name] is a 70 year old female who has had  chronic hoarseness.  She is a heavy smoker, smokes about one pack a day.  She had had previous surgery on her vocal cords about 20 years ago.  She has  continued to smoke and has had worsening of her voice over the last several  months.  On exam in the office she has bilateral vocal cord polypoid changes  consistent with Reinke's edema.  She is taken to the operating room at this  time for excision of vocal cord polyps.   DESCRIPTION OF PROCEDURE:  After adequate endotracheal anesthesia, direct  laryngoscopy was performed.  The base of tongue, vallecula and epiglottis  were all normal.  Both piriform sinuses were clear.  Vocal cords were  evaluated.  She had bilateral vocal cord polypoid changes, left side worse  than right.  The laryngoscope was suspended.  Pictures were obtained.  Then  using cup forceps and the scissors, the polyp on the left vocal cord which  was large was removed first.  She had a significant amount of submucosal  edema which was stripped off of the vocal ligament on the left side.  On the  right side, a portion of the polypoid changes was also excised with  scissors.  The anterior commissure  was preserved.  This completed the  procedure.  Cotton pledgets soaked in Adrenalin were placed for hemostasis.  [name] was subsequently awakened from anesthesia and transferred to recovery  room postoperatively doing well.   DISPOSITION:  [name] was discharged to home later this morning.  She was  instructed in voice rest for the next two weeks, in addition to Nexium 40 mg  q.d. for two weeks.  Will have her follow up in my office in two weeks for  recheck.           ______________________________  Kristine Garbe. Ezzard Standing, M.D.     CEN/MEDQ  D:  05/16/2005  T:  05/16/2005  Job:  [id number]   cc:   Tally Joe, M.D.  Fax: [phone]

## 2010-10-25 ENCOUNTER — Other Ambulatory Visit: Payer: Self-pay | Admitting: Cardiovascular Disease

## 2010-10-26 ENCOUNTER — Other Ambulatory Visit: Payer: Self-pay | Admitting: *Deleted

## 2010-10-26 DIAGNOSIS — E7800 Pure hypercholesterolemia, unspecified: Secondary | ICD-10-CM

## 2010-10-26 MED ORDER — ATORVASTATIN CALCIUM 40 MG PO TABS: 40.0000 mg | ORAL_TABLET | Freq: Every day | ORAL | Status: DC

## 2010-10-26 NOTE — Telephone Encounter (Signed)
Fax received from pharmacy. Refill completed. Jodette Francis Doenges RN  

## 2010-10-28 ENCOUNTER — Other Ambulatory Visit: Payer: Self-pay | Admitting: Cardiovascular Disease

## 2010-10-31 NOTE — Telephone Encounter (Signed)
Med refill

## 2010-12-20 ENCOUNTER — Other Ambulatory Visit (HOSPITAL_COMMUNITY): Payer: Self-pay | Admitting: Family Medicine

## 2010-12-20 DIAGNOSIS — Z1231 Encounter for screening mammogram for malignant neoplasm of breast: Secondary | ICD-10-CM

## 2011-01-11 ENCOUNTER — Other Ambulatory Visit: Payer: Self-pay | Admitting: *Deleted

## 2011-01-11 MED ORDER — CLOPIDOGREL BISULFATE 75 MG PO TABS: 75.0000 mg | ORAL_TABLET | Freq: Every day | ORAL | Status: DC

## 2011-01-11 NOTE — Telephone Encounter (Signed)
Fax received from pharmacy. Refill completed. Jodette Colum Colt RN  

## 2011-01-20 ENCOUNTER — Ambulatory Visit (HOSPITAL_COMMUNITY)
Admission: RE | Admit: 2011-01-20 | Discharge: 2011-01-20 | Disposition: A | Discharge status: Home / Self Care | Payer: Medicare Other | Source: Ambulatory Visit | Attending: Family Medicine | Admitting: Family Medicine

## 2011-01-20 DIAGNOSIS — Z1231 Encounter for screening mammogram for malignant neoplasm of breast: Secondary | ICD-10-CM | POA: Insufficient documentation

## 2011-01-23 ENCOUNTER — Other Ambulatory Visit: Payer: Self-pay | Admitting: *Deleted

## 2011-01-23 MED ORDER — LISINOPRIL 10 MG PO TABS: 10.0000 mg | ORAL_TABLET | Freq: Every day | ORAL | Status: DC

## 2011-01-23 NOTE — Telephone Encounter (Signed)
Fax Received. Refill Completed. Nil Xiong Chowoe (M.A)  

## 2011-01-25 ENCOUNTER — Other Ambulatory Visit: Payer: Self-pay | Admitting: *Deleted

## 2011-01-31 ENCOUNTER — Other Ambulatory Visit: Payer: Self-pay | Admitting: Cardiovascular Disease

## 2011-02-01 ENCOUNTER — Other Ambulatory Visit: Payer: Self-pay | Admitting: *Deleted

## 2011-02-01 NOTE — Telephone Encounter (Signed)
Opened in Error.

## 2011-06-20 ENCOUNTER — Emergency Department (INDEPENDENT_AMBULATORY_CARE_PROVIDER_SITE_OTHER): Payer: Medicare Other

## 2011-06-20 ENCOUNTER — Emergency Department (HOSPITAL_BASED_OUTPATIENT_CLINIC_OR_DEPARTMENT_OTHER)
Admission: EM | Admit: 2011-06-20 | Discharge: 2011-06-21 | Disposition: A | Discharge status: Home / Self Care | Payer: Medicare Other | Attending: Emergency Medicine | Admitting: Emergency Medicine

## 2011-06-20 ENCOUNTER — Other Ambulatory Visit: Payer: Self-pay

## 2011-06-20 ENCOUNTER — Encounter (HOSPITAL_BASED_OUTPATIENT_CLINIC_OR_DEPARTMENT_OTHER): Payer: Self-pay | Admitting: Emergency Medicine

## 2011-06-20 DIAGNOSIS — J189 Pneumonia, unspecified organism: Secondary | ICD-10-CM

## 2011-06-20 DIAGNOSIS — Z79899 Other long term (current) drug therapy: Secondary | ICD-10-CM | POA: Insufficient documentation

## 2011-06-20 DIAGNOSIS — E785 Hyperlipidemia, unspecified: Secondary | ICD-10-CM | POA: Insufficient documentation

## 2011-06-20 DIAGNOSIS — I2510 Atherosclerotic heart disease of native coronary artery without angina pectoris: Secondary | ICD-10-CM | POA: Insufficient documentation

## 2011-06-20 DIAGNOSIS — R4182 Altered mental status, unspecified: Secondary | ICD-10-CM | POA: Insufficient documentation

## 2011-06-20 DIAGNOSIS — Z8673 Personal history of transient ischemic attack (TIA), and cerebral infarction without residual deficits: Secondary | ICD-10-CM | POA: Insufficient documentation

## 2011-06-20 DIAGNOSIS — R4701 Aphasia: Secondary | ICD-10-CM | POA: Insufficient documentation

## 2011-06-20 DIAGNOSIS — N390 Urinary tract infection, site not specified: Secondary | ICD-10-CM

## 2011-06-20 DIAGNOSIS — I10 Essential (primary) hypertension: Secondary | ICD-10-CM | POA: Insufficient documentation

## 2011-06-20 DIAGNOSIS — R9431 Abnormal electrocardiogram [ECG] [EKG]: Secondary | ICD-10-CM

## 2011-06-20 LAB — DIFFERENTIAL
Lymphocytes Relative: 22 % (ref 12–46)
Lymphs Abs: 1.8 10*3/uL (ref 0.7–4.0)
Monocytes Relative: 18 % — ABNORMAL HIGH (ref 3–12)
Neutrophils Relative %: 60 % (ref 43–77)

## 2011-06-20 LAB — CBC
Hemoglobin: 14.4 g/dL (ref 12.0–15.0)
MCH: 32.5 pg (ref 26.0–34.0)
MCV: 94.8 fL (ref 78.0–100.0)
Platelets: 191 10*3/uL (ref 150–400)
RBC: 4.43 MIL/uL (ref 3.87–5.11)
WBC: 8.1 10*3/uL (ref 4.0–10.5)

## 2011-06-20 NOTE — ED Notes (Signed)
Daughter reports pt with increased forgetfulness, increased dysphagia, and confusion x 2 days.

## 2011-06-21 DIAGNOSIS — R4182 Altered mental status, unspecified: Secondary | ICD-10-CM

## 2011-06-21 DIAGNOSIS — G9389 Other specified disorders of brain: Secondary | ICD-10-CM

## 2011-06-21 DIAGNOSIS — R51 Headache: Secondary | ICD-10-CM

## 2011-06-21 DIAGNOSIS — R918 Other nonspecific abnormal finding of lung field: Secondary | ICD-10-CM

## 2011-06-21 DIAGNOSIS — M4120 Other idiopathic scoliosis, site unspecified: Secondary | ICD-10-CM

## 2011-06-21 DIAGNOSIS — I7389 Other specified peripheral vascular diseases: Secondary | ICD-10-CM

## 2011-06-21 LAB — COMPREHENSIVE METABOLIC PANEL
ALT: 9 U/L (ref 0–35)
Alkaline Phosphatase: 81 U/L (ref 39–117)
BUN: 23 mg/dL (ref 6–23)
CO2: 27 mEq/L (ref 19–32)
GFR calc Af Amer: 73 mL/min — ABNORMAL LOW (ref >90)
GFR calc non Af Amer: 63 mL/min — ABNORMAL LOW (ref >90)
Glucose, Bld: 107 mg/dL — ABNORMAL HIGH (ref 70–99)
Potassium: 3.6 mEq/L (ref 3.5–5.1)
Sodium: 140 mEq/L (ref 135–145)
Total Bilirubin: 1.3 mg/dL — ABNORMAL HIGH (ref 0.3–1.2)

## 2011-06-21 LAB — URINE MICROSCOPIC-ADD ON

## 2011-06-21 LAB — URINALYSIS, ROUTINE W REFLEX MICROSCOPIC
Ketones, ur: 15 mg/dL — AB
Nitrite: NEGATIVE
Protein, ur: 30 mg/dL — AB
Urobilinogen, UA: 1 mg/dL (ref 0.0–1.0)

## 2011-06-21 LAB — CARDIAC PANEL(CRET KIN+CKTOT+MB+TROPI)
Relative Index: 3 — ABNORMAL HIGH (ref 0.0–2.5)
Total CK: 138 U/L (ref 7–177)
Troponin I: <0.3 ng/mL (ref <0.30)

## 2011-06-21 MED ORDER — PIPERACILLIN-TAZOBACTAM 3.375 G IVPB: 3.3750 g | Freq: Once | INTRAVENOUS | Status: DC

## 2011-06-21 MED ORDER — MOXIFLOXACIN HCL 400 MG PO TABS: 400.0000 mg | ORAL_TABLET | Freq: Every day | ORAL | Status: AC

## 2011-06-21 MED ORDER — VANCOMYCIN HCL 10 G IV SOLR
1.0000 g | Freq: Once | INTRAVENOUS | Status: DC
  Filled 2011-06-21: qty 1000

## 2011-06-21 NOTE — ED Notes (Signed)
Pt's daughter at bs states that they would prefer to take the pt home to provide care rather than having the pt admitted. MD explained all the risks associated with the pt's present illnesses but daughter continues to demand that the pt be signed out AMA.

## 2011-06-21 NOTE — ED Notes (Signed)
Pt. Talking non stop with clear conscise verbalization.  Pt. Has history of dysphagia with no drooling and able to swallow WNL.

## 2011-06-21 NOTE — ED Provider Notes (Addendum)
History     CSN: [id number]  Arrival date & time 06/20/11  2226   First MD Initiated Contact with Patient 06/20/11 2306      Chief Complaint  Patient presents with  . Altered Mental Status    (Consider location/radiation/quality/duration/timing/severity/associated sxs/prior treatment) Patient is a 71 y.o. female presenting with altered mental status. The history is provided by a relative. The history is limited by the condition of the patient. No language interpreter was used.  Altered Mental Status This is a recurrent problem. The current episode started yesterday. The problem occurs constantly. The problem has not changed since onset.Pertinent negatives include no chest pain and no abdominal pain. The symptoms are aggravated by nothing. The symptoms are relieved by nothing. She has tried nothing for the symptoms. The treatment provided no relief.  Has had a stroke and has been increasingly forgetful and unable to recall things for about the past 36 hours.  Has expressive aphasia at baseline.    Past Medical History  Diagnosis Date  . Hypertension   . Hyperlipidemia   . CVA (cerebrovascular accident)   . Coronary artery disease 2000    STATUS POT PTCA ND STENTING OF THE LAD     History reviewed. No pertinent past surgical history.  No family history on file.  History  Substance Use Topics  . Smoking status: Former Smoker    Quit date: 05/01/2008  . Smokeless tobacco: Not on file  . Alcohol Use: No    OB History    Grav Para Term Preterm Abortions TAB SAB Ect Mult Living                  Review of Systems  Unable to perform ROS Cardiovascular: Negative for chest pain.  Gastrointestinal: Negative for abdominal pain.  Psychiatric/Behavioral: Positive for altered mental status.    Allergies  Review of patient's allergies indicates no known allergies.  Home Medications   Current Outpatient Rx  Name Route Sig Dispense Refill  . ATORVASTATIN CALCIUM 40 MG PO  TABS      . CLOPIDOGREL BISULFATE 75 MG PO TABS Oral Take 1 tablet (75 mg total) by mouth daily. 30 tablet 11  . DIVALPROEX SODIUM 125 MG PO TBEC Oral Take 125 mg by mouth 3 (three) times daily.    Marland Kitchen LISINOPRIL 10 MG PO TABS Oral Take 1 tablet (10 mg total) by mouth daily. 30 tablet 5  . METOPROLOL TARTRATE 25 MG PO TABS        BP 116/50  Pulse 71  Temp(Src) 99 F (37.2 C) (Oral)  Resp 18  SpO2 97%  Physical Exam  Constitutional: She appears well-developed and well-nourished. No distress.  HENT:  Head: Normocephalic and atraumatic.  Right Ear: Tympanic membrane is not injected. No hemotympanum.  Left Ear: Tympanic membrane is not injected. No hemotympanum.  Mouth/Throat: Oropharynx is clear and moist.  Eyes: Conjunctivae are normal. Pupils are equal, round, and reactive to light.  Neck: Normal range of motion. Neck supple.  Cardiovascular: Normal rate and regular rhythm.   Pulmonary/Chest: Effort normal and breath sounds normal. She has no wheezes.  Abdominal: Soft. Bowel sounds are normal. There is no tenderness. There is no rebound and no guarding.  Musculoskeletal: She exhibits no edema.  Neurological: She is alert.  Skin: Skin is warm and dry.  Psychiatric: She has a normal mood and affect.    ED Course  Procedures (including critical care time)  Labs Reviewed  DIFFERENTIAL - Abnormal; Notable for  the following:    Monocytes Relative 18 (*)    Monocytes Absolute 1.4 (*)    All other components within normal limits  COMPREHENSIVE METABOLIC PANEL - Abnormal; Notable for the following:    Glucose, Bld 107 (*)    Total Bilirubin 1.3 (*)    GFR calc non Af Amer 63 (*)    GFR calc Af Amer 73 (*)    All other components within normal limits  URINALYSIS, ROUTINE W REFLEX MICROSCOPIC - Abnormal; Notable for the following:    Color, Urine AMBER (*) BIOCHEMICALS MAY BE AFFECTED BY COLOR   APPearance CLOUDY (*)    Hgb urine dipstick SMALL (*)    Bilirubin Urine SMALL (*)     Ketones, ur 15 (*)    Protein, ur 30 (*)    Leukocytes, UA SMALL (*)    All other components within normal limits  CARDIAC PANEL(CRET KIN+CKTOT+MB+TROPI) - Abnormal; Notable for the following:    CK, MB 4.2 (*)    Relative Index 3.0 (*)    All other components within normal limits  URINE MICROSCOPIC-ADD ON - Abnormal; Notable for the following:    Bacteria, UA FEW (*)    Casts HYALINE CASTS (*)    All other components within normal limits  CBC  URINE CULTURE   Dg Chest 2 View  06/21/2011  *RADIOLOGY REPORT*  Clinical Data: Altered mental status.  CHEST - 2 VIEW  Comparison: Chest radiograph performed 12/23/2008  Findings: The lungs are relatively well-aerated.  Mild right basilar airspace opacity, also suggested on the lateral view, may reflect atelectasis or possibly pneumonia.  There is no evidence of pleural effusion or pneumothorax.  The heart is normal in size; the mediastinal contour is within normal limits.  No acute osseous abnormalities are seen.  Right convex thoracic scoliosis is noted.  IMPRESSION:  1.  Mild right basilar airspace opacity may reflect atelectasis or possibly pneumonia. 2.  Right convex thoracic scoliosis noted.  Original Report Authenticated By: Tonia Ghent, M.D.     No diagnosis found.    MDM   Date: 06/21/2011  Rate: 67  Rhythm: normal sinus rhythm  QRS Axis: left  Intervals: normal  ST/T Wave abnormalities: ST depressions laterally  Conduction Disutrbances:none  Narrative Interpretation:   Old EKG Reviewed: unchanged from previous    Daughter refusing admission on patient's behalf as her health care advocate.  Daughter has decision making capacity to refuse care and verbalizes understanding that risks of signing AMA are but are not limited to: death stroke, sepsis, cardiac arrest, and prolonged morbidity.  Patient's daughter verbalizes understanding and agrees to follow up      Sakeena Teall K Pier Laux-Rasch, MD 06/21/11 0136  Stephaine Breshears K  Arlie Riker-Rasch, MD 06/21/11 [id number]

## 2011-06-22 LAB — URINE CULTURE

## 2011-08-07 ENCOUNTER — Other Ambulatory Visit: Payer: Self-pay | Admitting: *Deleted

## 2011-08-07 MED ORDER — LISINOPRIL 10 MG PO TABS: 10.0000 mg | ORAL_TABLET | Freq: Every day | ORAL | Status: DC

## 2011-08-09 ENCOUNTER — Other Ambulatory Visit: Payer: Self-pay | Admitting: Cardiovascular Disease

## 2011-08-09 MED ORDER — LISINOPRIL 10 MG PO TABS: 10.0000 mg | ORAL_TABLET | Freq: Every day | ORAL | Status: DC

## 2011-08-14 ENCOUNTER — Ambulatory Visit (INDEPENDENT_AMBULATORY_CARE_PROVIDER_SITE_OTHER): Payer: Medicare Other | Admitting: Cardiovascular Disease

## 2011-08-14 ENCOUNTER — Encounter: Payer: Self-pay | Admitting: Cardiovascular Disease

## 2011-08-14 VITALS — BP 132/75 | HR 59 | Ht 62.0 in | Wt 143.1 lb

## 2011-08-14 DIAGNOSIS — I639 Cerebral infarction, unspecified: Secondary | ICD-10-CM

## 2011-08-14 DIAGNOSIS — I6350 Cerebral infarction due to unspecified occlusion or stenosis of unspecified cerebral artery: Secondary | ICD-10-CM

## 2011-08-14 DIAGNOSIS — I2510 Atherosclerotic heart disease of native coronary artery without angina pectoris: Secondary | ICD-10-CM

## 2011-08-14 DIAGNOSIS — E785 Hyperlipidemia, unspecified: Secondary | ICD-10-CM

## 2011-08-14 NOTE — Progress Notes (Signed)
    [name] Date of Birth  [birth year] [facility]     [place] Office  [address]    [address]   [address] [place], [place]  [address]    [place], [place]  [address] [phone]  Fax  [phone]  [phone]  Fax [phone]  Problems: 1. Coronary artery disease-status post PTCA and stenting of her left anterior descending artery 2. Stroke 3. Hypertension 4. Hyperlipidemia  History of Present Illness:  [name] is doing very well. She has a history of coronary artery disease. She's also had a major stroke. Her speech has gradually improved.  She's walking some.  she's not had any episodes of angina.  Current Outpatient Prescriptions on File Prior to Visit  Medication Sig Dispense Refill  . atorvastatin (LIPITOR) 40 MG tablet       . clopidogrel (PLAVIX) 75 MG tablet Take 1 tablet (75 mg total) by mouth daily.  30 tablet  11  . divalproex (DEPAKOTE) 125 MG DR tablet Take 125 mg by mouth 3 (three) times daily.      Marland Kitchen lisinopril (PRINIVIL,ZESTRIL) 10 MG tablet Take 1 tablet (10 mg total) by mouth daily.  30 tablet  5  . metoprolol tartrate (LOPRESSOR) 25 MG tablet         No Known Allergies  Past Medical History  Diagnosis Date  . Hypertension   . Hyperlipidemia   . Coronary artery disease 2000    STATUS POT PTCA ND STENTING OF THE LAD   . CVA (cerebrovascular accident) 2010    No past surgical history on file.  History  Smoking status  . Former Smoker  . Quit date: 05/01/2008  Smokeless tobacco  . Not on file    History  Alcohol Use No    No family history on file.  Reviw of Systems:  Reviewed in the HPI.  All other systems are negative.  Physical Exam: Blood pressure 143/74, pulse 59, height 5\' 2"  (1.575 m), weight 143 lb 1.9 oz (64.919 kg). General: Well developed, well nourished, in no acute distress.  Head: Normocephalic, atraumatic, sclera non-icteric, mucus membranes are moist,   Neck: Supple. Carotids are 2 + without  bruits. No JVD  Lungs: Clear bilaterally to auscultation.  Heart: regular rate.  normal  S1 S2. No murmurs, gallops or rubs.  Abdomen: Soft, non-tender, non-distended with normal bowel sounds. No hepatomegaly. No rebound/guarding. No masses.  Msk:  Strength and tone are normal  Extremities: No clubbing or cyanosis. No edema.  Distal pedal pulses are 2+ and equal bilaterally.  Neuro: Alert and oriented X 3. Moves all extremities spontaneously.  Psych:  Responds to questions appropriately with a normal affect.  ECG:   Assessment / Plan:

## 2011-08-14 NOTE — Assessment & Plan Note (Signed)
She status post PTCA and stenting in the past. She's not had any further episodes of chest pain or shortness of breath. 

## 2011-08-14 NOTE — Patient Instructions (Signed)
Your physician wants you to follow-up in: 1 year  You will receive a reminder letter in the mail two months in advance. If you don't receive a letter, please call our office to schedule the follow-up appointment.  Your physician recommends that you return for a FASTING lipid profile: today and in 1 year   

## 2011-08-14 NOTE — Assessment & Plan Note (Signed)
We'll check labs today. We'll see her again in one year for office visit and fasting labs.

## 2011-08-14 NOTE — Assessment & Plan Note (Signed)
She seems to be making some slow and steady progress.

## 2011-08-15 LAB — HEPATIC FUNCTION PANEL
ALT: 14 U/L (ref 0–35)
Albumin: 4.3 g/dL (ref 3.5–5.2)
Total Bilirubin: 0.9 mg/dL (ref 0.3–1.2)

## 2011-08-15 LAB — BASIC METABOLIC PANEL
CO2: 28 mEq/L (ref 19–32)
GFR: 115.55 mL/min (ref >60.00)
Glucose, Bld: 90 mg/dL (ref 70–99)
Potassium: 4.4 mEq/L (ref 3.5–5.1)
Sodium: 143 mEq/L (ref 135–145)

## 2011-08-15 LAB — LIPID PANEL
HDL: 60.5 mg/dL (ref >39.00)
Triglycerides: 116 mg/dL (ref 0.0–149.0)
VLDL: 23.2 mg/dL (ref 0.0–40.0)

## 2011-10-12 ENCOUNTER — Other Ambulatory Visit (HOSPITAL_COMMUNITY): Payer: Self-pay | Admitting: *Deleted

## 2011-10-12 MED ORDER — ATORVASTATIN CALCIUM 40 MG PO TABS: 40.0000 mg | ORAL_TABLET | Freq: Every day | ORAL | Status: DC

## 2011-10-12 NOTE — Telephone Encounter (Signed)
Refilled atorvastatin

## 2011-11-04 ENCOUNTER — Emergency Department (HOSPITAL_BASED_OUTPATIENT_CLINIC_OR_DEPARTMENT_OTHER)
Admission: EM | Admit: 2011-11-04 | Discharge: 2011-11-04 | Disposition: A | Discharge status: Home / Self Care | Payer: Medicare Other | Attending: Emergency Medicine | Admitting: Emergency Medicine

## 2011-11-04 ENCOUNTER — Encounter (HOSPITAL_BASED_OUTPATIENT_CLINIC_OR_DEPARTMENT_OTHER): Payer: Self-pay | Admitting: Emergency Medicine

## 2011-11-04 DIAGNOSIS — Z8673 Personal history of transient ischemic attack (TIA), and cerebral infarction without residual deficits: Secondary | ICD-10-CM | POA: Insufficient documentation

## 2011-11-04 DIAGNOSIS — I2510 Atherosclerotic heart disease of native coronary artery without angina pectoris: Secondary | ICD-10-CM | POA: Insufficient documentation

## 2011-11-04 DIAGNOSIS — I10 Essential (primary) hypertension: Secondary | ICD-10-CM | POA: Insufficient documentation

## 2011-11-04 DIAGNOSIS — L0390 Cellulitis, unspecified: Secondary | ICD-10-CM

## 2011-11-04 DIAGNOSIS — E785 Hyperlipidemia, unspecified: Secondary | ICD-10-CM | POA: Insufficient documentation

## 2011-11-04 DIAGNOSIS — Z9861 Coronary angioplasty status: Secondary | ICD-10-CM | POA: Insufficient documentation

## 2011-11-04 DIAGNOSIS — Z87891 Personal history of nicotine dependence: Secondary | ICD-10-CM | POA: Insufficient documentation

## 2011-11-04 DIAGNOSIS — IMO0002 Reserved for concepts with insufficient information to code with codable children: Secondary | ICD-10-CM | POA: Insufficient documentation

## 2011-11-04 LAB — URINALYSIS, ROUTINE W REFLEX MICROSCOPIC
Glucose, UA: NEGATIVE mg/dL
Ketones, ur: NEGATIVE mg/dL
Nitrite: NEGATIVE
Protein, ur: NEGATIVE mg/dL
Urobilinogen, UA: 0.2 mg/dL (ref 0.0–1.0)

## 2011-11-04 LAB — URINE MICROSCOPIC-ADD ON

## 2011-11-04 MED ORDER — CEPHALEXIN 250 MG PO CAPS: 500.0000 mg | ORAL_CAPSULE | Freq: Two times a day (BID) | ORAL | Status: AC

## 2011-11-04 MED ORDER — SULFAMETHOXAZOLE-TRIMETHOPRIM 800-160 MG PO TABS: 1.0000 | ORAL_TABLET | Freq: Two times a day (BID) | ORAL | Status: AC

## 2011-11-04 NOTE — ED Provider Notes (Signed)
History     CSN: [id number]  Arrival date & time 11/04/11  1043   First MD Initiated Contact with Patient 11/04/11 1055      Chief Complaint  Patient presents with  . Skin Problem  . Urinary Tract Infection    (Consider location/radiation/quality/duration/timing/severity/associated sxs/prior treatment) HPI Pt has had several days of L arm redness near elbow. No fever chills. FROM at elbow. Daughter also states pt's urine smells strong and she get frequent UTI's. Pt denies dysuria, frequency, abd pain  Past Medical History  Diagnosis Date  . Hypertension   . Hyperlipidemia   . Coronary artery disease 2000    STATUS POT PTCA ND STENTING OF THE LAD   . CVA (cerebrovascular accident) 2010    History reviewed. No pertinent past surgical history.  No family history on file.  History  Substance Use Topics  . Smoking status: Former Smoker    Quit date: 05/01/2008  . Smokeless tobacco: Not on file  . Alcohol Use: No    OB History    Grav Para Term Preterm Abortions TAB SAB Ect Mult Living                  Review of Systems  Constitutional: Negative for fever and chills.  Respiratory: Negative for shortness of breath.   Cardiovascular: Negative for chest pain.  Gastrointestinal: Negative for nausea, vomiting, abdominal pain and diarrhea.  Genitourinary: Negative for dysuria, frequency and flank pain.  Skin: Positive for color change and rash. Negative for wound.  Neurological: Negative for weakness and numbness.    Allergies  Review of patient's allergies indicates no known allergies.  Home Medications   Current Outpatient Rx  Name Route Sig Dispense Refill  . ATORVASTATIN CALCIUM 40 MG PO TABS Oral Take 1 tablet (40 mg total) by mouth daily. 90 tablet 3  . CEPHALEXIN 250 MG PO CAPS Oral Take 2 capsules (500 mg total) by mouth 2 (two) times daily. 20 capsule 0  . CLOPIDOGREL BISULFATE 75 MG PO TABS Oral Take 1 tablet (75 mg total) by mouth daily. 30 tablet 11  .  DIVALPROEX SODIUM 125 MG PO TBEC Oral Take 125 mg by mouth 3 (three) times daily.    Marland Kitchen LISINOPRIL 10 MG PO TABS Oral Take 1 tablet (10 mg total) by mouth daily. 30 tablet 5  . METOPROLOL TARTRATE 25 MG PO TABS      . SULFAMETHOXAZOLE-TRIMETHOPRIM 800-160 MG PO TABS Oral Take 1 tablet by mouth 2 (two) times daily. 20 tablet 0    BP 126/71  Pulse 71  Temp 98 F (36.7 C) (Oral)  Resp 18  Physical Exam  Nursing note and vitals reviewed. Constitutional: She is oriented to person, place, and time. She appears well-developed and well-nourished. No distress.  HENT:  Head: Normocephalic and atraumatic.  Mouth/Throat: Oropharynx is clear and moist.  Eyes: EOM are normal. Pupils are equal, round, and reactive to light.  Neck: Normal range of motion. Neck supple.  Cardiovascular: Normal rate and regular rhythm.   Pulmonary/Chest: Effort normal and breath sounds normal. No respiratory distress. She has no wheezes. She has no rales.  Abdominal: Soft. Bowel sounds are normal. There is no tenderness. There is no rebound and no guarding.  Musculoskeletal: Normal range of motion. She exhibits no edema and no tenderness.  Neurological: She is alert and oriented to person, place, and time.       5/5 motor, sensation intact  Skin: Skin is warm and dry. No  rash noted. There is erythema (Redness to skin covering L elbow extending up arm. No definite abscess. No axillary lymphadenopatthy. FROM at elbow without pain. 2+ radial pulse).  Psychiatric: She has a normal mood and affect. Her behavior is normal.    ED Course  Procedures (including critical care time)  Labs Reviewed  URINALYSIS, ROUTINE W REFLEX MICROSCOPIC - Abnormal; Notable for the following:    APPearance CLOUDY (*)     Hgb urine dipstick SMALL (*)     Leukocytes, UA SMALL (*)     All other components within normal limits  URINE MICROSCOPIC-ADD ON - Abnormal; Notable for the following:    Squamous Epithelial / LPF MANY (*)     Bacteria,  UA MANY (*)     All other components within normal limits   No results found.   1. Cellulitis       MDM   Site on elbow marked. Return for worsening redness, pain, fever, chills or concerns       Loren Racer, MD 11/04/11 1156

## 2011-11-04 NOTE — ED Notes (Signed)
Pt c/o redness to LT elbow since 11/02/11; also noticed nodule to top of LT shoulder; daughter reports urine has a foul odor

## 2011-11-06 ENCOUNTER — Other Ambulatory Visit: Payer: Self-pay | Admitting: *Deleted

## 2011-11-06 MED ORDER — METOPROLOL TARTRATE 25 MG PO TABS: 25.0000 mg | ORAL_TABLET | Freq: Two times a day (BID) | ORAL | Status: DC

## 2011-11-06 NOTE — Telephone Encounter (Signed)
Refilled metoprolol 

## 2011-12-27 ENCOUNTER — Other Ambulatory Visit: Payer: Self-pay | Admitting: *Deleted

## 2011-12-27 MED ORDER — CLOPIDOGREL BISULFATE 75 MG PO TABS: 75.0000 mg | ORAL_TABLET | Freq: Every day | ORAL | Status: DC

## 2011-12-27 NOTE — Telephone Encounter (Signed)
Fax Received. Refill Completed. [name] (R.M.A)   

## 2012-01-31 ENCOUNTER — Other Ambulatory Visit: Payer: Self-pay | Admitting: *Deleted

## 2012-01-31 MED ORDER — LISINOPRIL 10 MG PO TABS: 10.0000 mg | ORAL_TABLET | Freq: Every day | ORAL | Status: DC

## 2012-01-31 NOTE — Telephone Encounter (Signed)
Fax Received. Refill Completed. Calleigh Lafontant Chowoe (R.M.A)   

## 2012-01-31 NOTE — Telephone Encounter (Signed)
Fax Received. Refill Completed. Avid Guillette Chowoe (R.M.A)   

## 2012-02-08 ENCOUNTER — Other Ambulatory Visit: Payer: Self-pay | Admitting: *Deleted

## 2012-02-08 NOTE — Telephone Encounter (Signed)
Opened in Error.

## 2012-02-09 ENCOUNTER — Other Ambulatory Visit: Payer: Self-pay | Admitting: *Deleted

## 2012-02-09 NOTE — Telephone Encounter (Signed)
Opened in Error.

## 2012-03-13 ENCOUNTER — Other Ambulatory Visit: Payer: Self-pay | Admitting: *Deleted

## 2012-03-13 NOTE — Telephone Encounter (Signed)
Opened in Error.

## 2012-07-04 ENCOUNTER — Other Ambulatory Visit: Payer: Self-pay | Admitting: *Deleted

## 2012-07-04 MED ORDER — CLOPIDOGREL BISULFATE 75 MG PO TABS: 75.0000 mg | ORAL_TABLET | Freq: Every day | ORAL | Status: DC

## 2012-07-04 NOTE — Telephone Encounter (Signed)
Pt states her daughter will call back to make appointment for her

## 2012-08-10 ENCOUNTER — Other Ambulatory Visit: Payer: Self-pay | Admitting: Neurology

## 2012-08-12 ENCOUNTER — Other Ambulatory Visit: Payer: Self-pay | Admitting: Neurology

## 2012-08-30 ENCOUNTER — Other Ambulatory Visit: Payer: Self-pay | Admitting: *Deleted

## 2012-08-30 MED ORDER — CLOPIDOGREL BISULFATE 75 MG PO TABS: 75.0000 mg | ORAL_TABLET | Freq: Every day | ORAL | Status: DC

## 2012-09-10 ENCOUNTER — Other Ambulatory Visit: Payer: Self-pay | Admitting: *Deleted

## 2012-09-10 MED ORDER — LISINOPRIL 10 MG PO TABS: 10.0000 mg | ORAL_TABLET | Freq: Every day | ORAL | Status: DC

## 2012-09-10 NOTE — Telephone Encounter (Signed)
spoke to pt to make an appointment and pt states she will call office back to make appointment. pt has not seen provider for a little over a year. Fax Received. Refill Completed. [name] (R.M.A)

## 2012-09-12 ENCOUNTER — Encounter: Payer: Self-pay | Admitting: Nurse Practitioner

## 2012-09-12 ENCOUNTER — Ambulatory Visit (INDEPENDENT_AMBULATORY_CARE_PROVIDER_SITE_OTHER): Payer: Medicare Other | Admitting: Nurse Practitioner

## 2012-09-12 VITALS — BP 142/72 | HR 62 | Ht 61.5 in | Wt 149.0 lb

## 2012-09-12 DIAGNOSIS — I10 Essential (primary) hypertension: Secondary | ICD-10-CM

## 2012-09-12 DIAGNOSIS — I639 Cerebral infarction, unspecified: Secondary | ICD-10-CM

## 2012-09-12 DIAGNOSIS — I6350 Cerebral infarction due to unspecified occlusion or stenosis of unspecified cerebral artery: Secondary | ICD-10-CM

## 2012-09-12 DIAGNOSIS — E785 Hyperlipidemia, unspecified: Secondary | ICD-10-CM

## 2012-09-12 NOTE — Patient Instructions (Addendum)
Carotid Doppler study was negative for stenosis Continue Depakote Continue Plavix for secondary stroke prevention Keep blood pressure less than 130 systolic Followup in 6 months or sooner problems arise

## 2012-09-12 NOTE — Progress Notes (Signed)
HPI: Patient returns for followup of her last visit with Dr. Pearlean Brownie 11/22/2011. 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 She denies any loss of vision, double vision, focal weakness or balance problems. She denies that she had fallen. She denies any stroke or TIA symptoms since last seen. 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 Memory is stable. She continues to live alone and with her son who has CP. There have been no safety issues.  ROS:  negative  Physical Exam General: well developed, well nourished, seated, in no evident distress Head: head normocephalic and atraumatic. Oropharynx benign Neck: supple with no carotid or supraclavicular bruits Cardiovascular: regular rate and rhythm, no murmurs  Neurologic Exam Mental Status: Awake and fully alert. Oriented to place and time. Speech and language appear normal without paraphasic errors or word hesitancy.  Mood and affect appropriate.  Cranial Nerves: Fundoscopic exam reveals sharp disc margins. Pupils equal, briskly reactive to light. Extraocular movements full without nystagmus. Visual fields show dense right homonymous hemianopsia.  Hearing intact and symmetric to finger snap. Facial sensation intact. Face, tongue, palate move normally and symmetrically. Neck flexion and extension normal.  Motor: Normal bulk and tone. Normal strength in all tested extremity muscles. No focal weakness Sensory.: intact to touch and pinprick and vibratory.  Coordination: Rapid alternating movements normal in all extremities. Finger-to-nose and heel-to-shin performed accurately bilaterally. Gait and Station: Arises from chair without difficulty. Stance is normal. Able to heel, toe and unsteady with tandem walk.  Reflexes: 2+ and symmetric. Toes downgoing.     ASSESSMENT: History of left  embolic posterior cerebral infarct in January of 2010. Brain MRI was chronic infarcts nothing acute. Last carotid Dopplers were negative in July of last year. EEG without seizure activity, dementia labs are normal.     PLAN: She will continue her Depakote at current dose Continue Plavix for secondary stroke prevention Keep systolic blood pressure less than 130 LDL less than 100 which is followed by primary care Fall risk=9, be careful with ambulation to prevent falls Followup in 6 months  Nilda Riggs, GNP-BC APRN

## 2012-09-26 ENCOUNTER — Telehealth: Payer: Self-pay | Admitting: *Deleted

## 2012-09-26 MED ORDER — CLOPIDOGREL BISULFATE 75 MG PO TABS: 75.0000 mg | ORAL_TABLET | Freq: Every day | ORAL | Status: DC

## 2012-09-26 NOTE — Telephone Encounter (Signed)
Called pt to see when she would like to make an appointment with Dr. Elease Hashimoto. Pt seemed a bit confused about everything I was saying to her. Asked pt if she had someone for me to call to make this appointment. Tried to verify the name that was on her chart but she could not remember the name or the number. Informed Dr. Harvie Bridge nurse about situation and she states she will call pt.

## 2012-09-26 NOTE — Telephone Encounter (Signed)
Pt was called to inform her she needs to schedule her yearly, she asked me to call her daughter, I left a voice MSG.

## 2012-10-11 ENCOUNTER — Telehealth: Payer: Self-pay | Admitting: Nurse Practitioner

## 2012-10-11 NOTE — Telephone Encounter (Signed)
Pt's daughter is trying to get guardianship on her mom but needs to know where to start and if Darrol Angel can help her with this. Pt left the gas stove on the other day didn't smell it or know it was on. She is concerned and worried about her mom and her condition.

## 2012-10-14 NOTE — Telephone Encounter (Signed)
I LMVM for pts daughter to return call.

## 2012-10-16 ENCOUNTER — Other Ambulatory Visit: Payer: Self-pay | Admitting: *Deleted

## 2012-10-16 MED ORDER — ATORVASTATIN CALCIUM 40 MG PO TABS: 40.0000 mg | ORAL_TABLET | Freq: Every day | ORAL | Status: DC

## 2012-10-16 NOTE — Telephone Encounter (Signed)
NO REFILLS UNTIL APPOINTMENT Fax Received. Refill Completed. Aleksandr Pellow Chowoe (R.M.A)   

## 2012-10-22 ENCOUNTER — Encounter: Payer: Self-pay | Admitting: Cardiology

## 2012-10-22 ENCOUNTER — Encounter: Payer: Self-pay | Admitting: Cardiovascular Disease

## 2012-10-25 ENCOUNTER — Encounter: Payer: Self-pay | Admitting: Cardiovascular Disease

## 2012-10-25 ENCOUNTER — Ambulatory Visit (INDEPENDENT_AMBULATORY_CARE_PROVIDER_SITE_OTHER): Payer: Medicare Other | Admitting: Cardiovascular Disease

## 2012-10-25 VITALS — BP 148/92 | HR 68 | Wt 148.8 lb

## 2012-10-25 DIAGNOSIS — I2510 Atherosclerotic heart disease of native coronary artery without angina pectoris: Secondary | ICD-10-CM

## 2012-10-25 DIAGNOSIS — E785 Hyperlipidemia, unspecified: Secondary | ICD-10-CM

## 2012-10-25 LAB — BASIC METABOLIC PANEL
BUN: 14 mg/dL (ref 6–23)
CO2: 21 mEq/L (ref 19–32)
Chloride: 108 mEq/L (ref 96–112)
Creatinine, Ser: 0.6 mg/dL (ref 0.4–1.2)
Glucose, Bld: 112 mg/dL — ABNORMAL HIGH (ref 70–99)
Potassium: 3.8 mEq/L (ref 3.5–5.1)

## 2012-10-25 LAB — HEPATIC FUNCTION PANEL
Albumin: 4.1 g/dL (ref 3.5–5.2)
Bilirubin, Direct: 0.1 mg/dL (ref 0.0–0.3)
Total Protein: 6.9 g/dL (ref 6.0–8.3)

## 2012-10-25 LAB — LIPID PANEL
LDL Cholesterol: 64 mg/dL (ref 0–99)
Total CHOL/HDL Ratio: 3
VLDL: 26.6 mg/dL (ref 0.0–40.0)

## 2012-10-25 NOTE — Progress Notes (Signed)
    [name] Date of Birth  05/20/1940 [facility]     [place] Office  [address]    [address]   [address] [place], [place]  [address]    [place], [place]  [address] [phone]  Fax  [phone]  [phone]  Fax [phone]  Problems: 1. Coronary artery disease-status post PTCA and stenting of her left anterior descending artery 2. Stroke 3. Hypertension 4. Hyperlipidemia  History of Present Illness:  [name] is doing very well. She has a history of coronary artery disease. She's also had a major stroke. Her speech has gradually improved.  She's walking some.  she's not had any episodes of angina.  October 25, 2012:  [name]  Is doing well.  She is staying inside because of the heat.    Current Outpatient Prescriptions on File Prior to Visit  Medication Sig Dispense Refill  . atorvastatin (LIPITOR) 40 MG tablet Take 1 tablet (40 mg total) by mouth daily.  30 tablet  0  . clopidogrel (PLAVIX) 75 MG tablet Take 1 tablet (75 mg total) by mouth daily.  30 tablet  2  . divalproex (DEPAKOTE) 125 MG DR tablet TAKE 2 TABLETS EVERY DAY  60 tablet  3  . lisinopril (PRINIVIL,ZESTRIL) 10 MG tablet Take 1 tablet (10 mg total) by mouth daily.  30 tablet  1  . metoprolol tartrate (LOPRESSOR) 25 MG tablet Take 1 tablet (25 mg total) by mouth 2 (two) times daily.  180 tablet  4   No current facility-administered medications on file prior to visit.    No Known Allergies  Past Medical History  Diagnosis Date  . Hypertension   . Hyperlipidemia   . Coronary artery disease 2000    STATUS POT PTCA ND STENTING OF THE LAD   . CVA (cerebrovascular accident) 2010    No past surgical history on file.  History  Smoking status  . Former Smoker  . Quit date: 05/01/2008  Smokeless tobacco  . Never Used    History  Alcohol Use No    No family history on file.  Reviw of Systems:  Reviewed in the HPI.  All other systems are negative.  Physical Exam: Blood pressure  148/92, pulse 68, weight 148 lb 12.8 oz (67.495 kg). General: Well developed, well nourished, in no acute distress.  Head: Normocephalic, atraumatic, sclera non-icteric, mucus membranes are moist,   Neck: Supple. Carotids are 2 + without bruits. No JVD  Lungs: Clear bilaterally to auscultation.  Heart: regular rate.  normal  S1 S2. No murmurs, gallops or rubs.  Abdomen: Soft, non-tender, non-distended with normal bowel sounds. No hepatomegaly. No rebound/guarding. No masses.  Msk:  Strength and tone are normal  Extremities: No clubbing or cyanosis. No edema.  Distal pedal pulses are 2+ and equal bilaterally.  Neuro: Alert and oriented X 3. Moves all extremities spontaneously.  Psych:  Responds to questions appropriately with a normal affect.  ECG: October 25, 2012:  NSR at 47. LAD, NS ST / T ab.   Assessment / Plan:

## 2012-10-25 NOTE — Assessment & Plan Note (Signed)
We will check her fasting labs today and again in 1 year.

## 2012-10-25 NOTE — Patient Instructions (Addendum)
Your physician recommends that you return for lab work in: today   Your physician wants you to follow-up in: 1 year You will receive a reminder letter in the mail two months in advance. If you don't receive a letter, please call our office to schedule the follow-up appointment.  Your physician recommends that you return for a FASTING lipid profile: 1 year

## 2012-10-25 NOTE — Assessment & Plan Note (Signed)
[name] is doing very well. We'll continue with her same medications. She's not having any episodes of angina.  She's had a stroke and is not quite as active as she used to be.  Will check fasting labs today. I'll see her in one year for office visit, EKG, and fasting labs

## 2012-11-13 ENCOUNTER — Other Ambulatory Visit: Payer: Self-pay

## 2012-11-13 MED ORDER — LISINOPRIL 10 MG PO TABS: 10.0000 mg | ORAL_TABLET | Freq: Every day | ORAL | Status: DC

## 2012-11-13 NOTE — Telephone Encounter (Signed)
Pt dtr called to rqst refill for lisinopril.refill completed

## 2012-11-21 ENCOUNTER — Other Ambulatory Visit: Payer: Self-pay | Admitting: *Deleted

## 2012-11-21 MED ORDER — ATORVASTATIN CALCIUM 40 MG PO TABS: 40.0000 mg | ORAL_TABLET | Freq: Every day | ORAL | Status: DC

## 2012-11-25 ENCOUNTER — Other Ambulatory Visit: Payer: Self-pay | Admitting: *Deleted

## 2012-12-04 ENCOUNTER — Other Ambulatory Visit: Payer: Self-pay

## 2013-01-10 ENCOUNTER — Other Ambulatory Visit: Payer: Self-pay | Admitting: Cardiovascular Disease

## 2013-02-05 ENCOUNTER — Other Ambulatory Visit: Payer: Self-pay

## 2013-02-05 MED ORDER — METOPROLOL TARTRATE 25 MG PO TABS: 25.0000 mg | ORAL_TABLET | Freq: Two times a day (BID) | ORAL | Status: DC

## 2013-03-06 ENCOUNTER — Other Ambulatory Visit: Payer: Self-pay

## 2013-03-11 ENCOUNTER — Other Ambulatory Visit: Payer: Self-pay | Admitting: Cardiovascular Disease

## 2013-03-17 ENCOUNTER — Ambulatory Visit: Payer: Medicare Other | Admitting: Nurse Practitioner

## 2013-07-05 ENCOUNTER — Other Ambulatory Visit: Payer: Self-pay | Admitting: Neurology

## 2013-09-11 ENCOUNTER — Other Ambulatory Visit: Payer: Self-pay | Admitting: Neurology

## 2013-09-18 ENCOUNTER — Other Ambulatory Visit: Payer: Self-pay | Admitting: Cardiovascular Disease

## 2013-10-09 ENCOUNTER — Other Ambulatory Visit: Payer: Self-pay | Admitting: Cardiovascular Disease

## 2013-10-09 ENCOUNTER — Telehealth: Payer: Self-pay | Admitting: Nurse Practitioner

## 2013-10-09 NOTE — Telephone Encounter (Signed)
Calling concerning the call from last year at this time. She states she never received a call back. After looking it up I advised her that her call was returned but she states she never got a call back please call. Regarding guardianship over mother

## 2013-10-10 ENCOUNTER — Other Ambulatory Visit: Payer: Self-pay | Admitting: *Deleted

## 2013-10-10 MED ORDER — LISINOPRIL 10 MG PO TABS: 10.0000 mg | ORAL_TABLET | Freq: Every day | ORAL | Status: DC

## 2013-10-10 NOTE — Telephone Encounter (Signed)
Lupita LeashDonna, looks like Andrey CampanileSandy called this patient and left her a message. She is now returning her call can you please help her?

## 2013-10-13 NOTE — Telephone Encounter (Signed)
I called [name], daughter of pt.  She was asking about POA, guardianship.  Also pt having increased problems with memory loss/ dementia.  Last seen yr ago.  F/u was cancelled.   I made RV appt with L Lam, NP (post stroke pt).  11-05-13 at 0930.  [facility] [place].  FAXed to her [employer].

## 2013-11-05 ENCOUNTER — Ambulatory Visit (INDEPENDENT_AMBULATORY_CARE_PROVIDER_SITE_OTHER): Payer: Medicare Other | Admitting: Nurse Practitioner

## 2013-11-05 ENCOUNTER — Encounter: Payer: Self-pay | Admitting: Nurse Practitioner

## 2013-11-05 VITALS — BP 138/73 | HR 64 | Ht 61.5 in | Wt 142.6 lb

## 2013-11-05 DIAGNOSIS — F0391 Unspecified dementia with behavioral disturbance: Secondary | ICD-10-CM

## 2013-11-05 DIAGNOSIS — IMO0002 Reserved for concepts with insufficient information to code with codable children: Secondary | ICD-10-CM

## 2013-11-05 DIAGNOSIS — I679 Cerebrovascular disease, unspecified: Secondary | ICD-10-CM

## 2013-11-05 DIAGNOSIS — F0392 Unspecified dementia, unspecified severity, with psychotic disturbance: Secondary | ICD-10-CM

## 2013-11-05 DIAGNOSIS — F0390 Unspecified dementia without behavioral disturbance: Secondary | ICD-10-CM

## 2013-11-05 DIAGNOSIS — F03918 Unspecified dementia, unspecified severity, with other behavioral disturbance: Secondary | ICD-10-CM

## 2013-11-05 DIAGNOSIS — R451 Restlessness and agitation: Secondary | ICD-10-CM

## 2013-11-05 DIAGNOSIS — F22 Delusional disorders: Secondary | ICD-10-CM

## 2013-11-05 DIAGNOSIS — R413 Other amnesia: Secondary | ICD-10-CM

## 2013-11-05 MED ORDER — QUETIAPINE FUMARATE 25 MG PO TABS: 25.0000 mg | ORAL_TABLET | Freq: Two times a day (BID) | ORAL | Status: DC

## 2013-11-05 MED ORDER — DONEPEZIL HCL 5 MG PO TABS: 5.0000 mg | ORAL_TABLET | Freq: Every day | ORAL | Status: DC

## 2013-11-05 MED ORDER — DONEPEZIL HCL 10 MG PO TABS: 10.0000 mg | ORAL_TABLET | Freq: Every day | ORAL | Status: DC
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
## 2013-11-05 NOTE — Patient Instructions (Addendum)
We will check MRI of the brain to check for new stroke or treatable conditions.  Check labs today for treatable conditions such as Vitamin B12 deficiency.  Home Health RN to assess home safety.  Social worker to help with legal issues, custody issues.  Start Donepizil (Aricept) 5 mg one tablet daily, after 1 month, increase to 10 mg daily.  After 1 month of starting Donepizil, start Seroquel for agitation and paranoia.  Start 1 tablet at bedtime for 1 week and then twice daily.  Side effects are sedation.    Follow up in 3 months, sooner as needed.  Dementia Dementia is a general term for problems with brain function. A person with dementia has memory loss and a hard time with at least one other brain function such as thinking, speaking, or problem solving. Dementia can affect social functioning, how you do your job, your mood, or your personality. The changes may be hidden for a long time. The earliest forms of this disease are usually not detected by family or friends. Dementia can be:  Irreversible.  Potentially reversible.  Partially reversible.  Progressive. This means it can get worse over time. CAUSES  Irreversible dementia causes may include:  Degeneration of brain cells (Alzheimer's disease or lewy body dementia).  Multiple small strokes (vascular dementia).  Infection (chronic meningitis or Creutzfelt-Jakob disease).  Frontotemporal dementia. This affects younger people, age 74 to 870, compared to those who have Alzheimer's disease.  Dementia associated with other disorders like Parkinson's disease, Huntington's disease, or HIV-associated dementia. Potentially or partially reversible dementia causes may include:  Medicines.  Metabolic causes such as excessive alcohol intake, vitamin B12 deficiency, or thyroid disease.  Masses or pressure in the brain such as a tumor, blood clot, or hydrocephalus. SYMPTOMS  Symptoms are often hard to detect. Family members or  coworkers may not notice them early in the disease process. Different people with dementia may have different symptoms. Symptoms can include:  A hard time with memory, especially recent memory. Long-term memory may not be impaired.  Asking the same question multiple times or forgetting something someone just said.  A hard time speaking your thoughts or finding certain words.  A hard time solving problems or performing familiar tasks (such as how to use a telephone).  Sudden changes in mood.  Changes in personality, especially increasing moodiness or mistrust.  Depression.  A hard time understanding complex ideas that were never a problem in the past. DIAGNOSIS  There are no specific tests for dementia.   Your caregiver may recommend a thorough evaluation. This is because some forms of dementia can be reversible. The evaluation will likely include a physical exam and getting a detailed history from you and a family member. The history often gives the best clues and suggestions for a diagnosis.  Memory testing may be done. A detailed brain function evaluation called neuropsychologic testing may be helpful.  Lab tests and brain imaging (such as a CT scan or MRI scan) are sometimes important.  Sometimes observation and re-evaluation over time is very helpful. TREATMENT  Treatment depends on the cause.   If the problem is a vitamin deficiency, it may be helped or cured with supplements.  For dementias such as Alzheimer's disease, medicines are available to stabilize or slow the course of the disease. There are no cures for this type of dementia.  Your caregiver can help direct you to groups, organizations, and other caregivers to help with decisions in the care of you or  your loved one. HOME CARE INSTRUCTIONS The care of individuals with dementia is varied and dependent upon the progression of the dementia. The following suggestions are intended for the person living with, or caring  for, the person with dementia.  Create a safe environment.  Remove the locks on bathroom doors to prevent the person from accidentally locking himself or herself in.  Use childproof latches on kitchen cabinets and any place where cleaning supplies, chemicals, or alcohol are kept.  Use childproof covers in unused electrical outlets.  Install childproof devices to keep doors and windows secured.  Remove stove knobs or install safety knobs and an automatic shut-off on the stove.  Lower the temperature on water heaters.  Label medicines and keep them locked up.  Secure knives, lighters, matches, power tools, and guns, and keep these items out of reach.  Keep the house free from clutter. Remove rugs or anything that might contribute to a fall.  Remove objects that might break and hurt the person.  Make sure lighting is good, both inside and outside.  Install grab rails as needed.  Use a monitoring device to alert you to falls or other needs for help.  Reduce confusion.  Keep familiar objects and people around.  Use night lights or dim lights at night.  Label items or areas.  Use reminders, notes, or directions for daily activities or tasks.  Keep a simple, consistent routine for waking, meals, bathing, dressing, and bedtime.  Create a calm, quiet environment.  Place large clocks and calendars prominently.  Display emergency numbers and home address near all telephones.  Use cues to establish different times of the day. An example is to open curtains to let the natural light in during the day.   Use effective communication.  Choose simple words and short sentences.  Use a gentle, calm tone of voice.  Be careful not to interrupt.  If the person is struggling to find a word or communicate a thought, try to provide the word or thought.  Ask one question at a time. Allow the person ample time to answer questions. Repeat the question again if the person does not  respond.  Reduce nighttime restlessness.  Provide a comfortable bed.  Have a consistent nighttime routine.  Ensure a regular walking or physical activity schedule. Involve the person in daily activities as much as possible.  Limit napping during the day.  Limit caffeine.  Attend social events that stimulate rather than overwhelm the senses.  Encourage good nutrition and hydration.  Reduce distractions during meal times and snacks.  Avoid foods that are too hot or too cold.  Monitor chewing and swallowing ability.  Continue with routine vision, hearing, dental, and medical screenings.  Only give over-the-counter or prescription medicines as directed by the caregiver.  Monitor driving abilities. Do not allow the person to drive when safe driving is no longer possible.  Register with an identification program which could provide location assistance in the event of a missing person situation. SEEK MEDICAL CARE IF:   New behavioral problems start such as moodiness, aggressiveness, or seeing things that are not there (hallucinations).  Any new problem with brain function happens. This includes problems with balance, speech, or falling a lot.  Problems with swallowing develop.  Any symptoms of other illness happen. Small changes or worsening in any aspect of brain function can be a sign that the illness is getting worse. It can also be a sign of another medical illness such as infection.  Seeing a caregiver right away is important. SEEK IMMEDIATE MEDICAL CARE IF:   A fever develops.  New or worsened confusion develops.  New or worsened sleepiness develops.  Staying awake becomes hard to do. Document Released: 10/11/2000 Document Revised: 07/10/2011 Document Reviewed: 09/12/2010 [facility] Patient Information 2015 [place], [place]. This information is not intended to replace advice given to you by your health care provider. Make sure you discuss any questions you have with your  health care provider.

## 2013-11-06 LAB — CBC
HCT: 42.7 % (ref 34.0–46.6)
Hemoglobin: 14.8 g/dL (ref 11.1–15.9)
MCH: 33.1 pg — ABNORMAL HIGH (ref 26.6–33.0)
MCHC: 34.7 g/dL (ref 31.5–35.7)
MCV: 96 fL (ref 79–97)
Platelets: 213 10*3/uL (ref 150–379)
RBC: 4.47 x10E6/uL (ref 3.77–5.28)
RDW: 13 % (ref 12.3–15.4)
WBC: 6 10*3/uL (ref 3.4–10.8)

## 2013-11-06 LAB — COMPREHENSIVE METABOLIC PANEL
ALT: 12 IU/L (ref 0–32)
AST: 24 IU/L (ref 0–40)
Albumin/Globulin Ratio: 2.2 (ref 1.1–2.5)
Albumin: 4.4 g/dL (ref 3.5–4.8)
Alkaline Phosphatase: 77 IU/L (ref 39–117)
BUN/Creatinine Ratio: 22 (ref 11–26)
BUN: 14 mg/dL (ref 8–27)
CO2: 24 mmol/L (ref 18–29)
Calcium: 9.3 mg/dL (ref 8.7–10.3)
Chloride: 104 mmol/L (ref 97–108)
Creatinine, Ser: 0.64 mg/dL (ref 0.57–1.00)
GFR calc Af Amer: 102 mL/min/{1.73_m2} (ref >59)
GFR calc non Af Amer: 88 mL/min/{1.73_m2} (ref >59)
Globulin, Total: 2 g/dL (ref 1.5–4.5)
Glucose: 100 mg/dL — ABNORMAL HIGH (ref 65–99)
Potassium: 4 mmol/L (ref 3.5–5.2)
Sodium: 143 mmol/L (ref 134–144)
Total Bilirubin: 0.7 mg/dL (ref 0.0–1.2)
Total Protein: 6.4 g/dL (ref 6.0–8.5)

## 2013-11-06 LAB — TSH: TSH: 1.74 u[IU]/mL (ref 0.450–4.500)

## 2013-11-06 LAB — VITAMIN B12: Vitamin B-12: 392 pg/mL (ref 211–946)

## 2013-11-06 NOTE — Progress Notes (Signed)
Quick Note:  I LMVM for pt that labs normal at cell #. To call back if questions. ______

## 2013-11-09 NOTE — Progress Notes (Signed)
I agree with the above plan 

## 2013-11-10 ENCOUNTER — Other Ambulatory Visit: Payer: Self-pay | Admitting: Nurse Practitioner

## 2013-11-13 ENCOUNTER — Other Ambulatory Visit: Payer: Medicare Other

## 2013-11-13 ENCOUNTER — Other Ambulatory Visit: Payer: Self-pay | Admitting: Cardiovascular Disease

## 2013-11-14 ENCOUNTER — Other Ambulatory Visit: Payer: Medicare Other

## 2013-11-15 ENCOUNTER — Other Ambulatory Visit: Payer: Self-pay | Admitting: Neurology

## 2013-11-20 DIAGNOSIS — F03918 Unspecified dementia, unspecified severity, with other behavioral disturbance: Secondary | ICD-10-CM | POA: Insufficient documentation

## 2013-11-20 DIAGNOSIS — F0391 Unspecified dementia with behavioral disturbance: Secondary | ICD-10-CM | POA: Insufficient documentation

## 2013-11-20 DIAGNOSIS — R451 Restlessness and agitation: Secondary | ICD-10-CM | POA: Insufficient documentation

## 2013-11-26 ENCOUNTER — Telehealth: Payer: Self-pay | Admitting: Neurology

## 2013-11-26 NOTE — Telephone Encounter (Signed)
Toniann FailWendy from [place] calling to make Dr. Pearlean BrownieSethi aware that patient's daughter requested early discharge from speech therapy.

## 2013-11-27 NOTE — Telephone Encounter (Signed)
Noted.  -LL 

## 2013-12-09 ENCOUNTER — Other Ambulatory Visit: Payer: Self-pay | Admitting: Nurse Practitioner

## 2013-12-12 ENCOUNTER — Ambulatory Visit
Admission: RE | Admit: 2013-12-12 | Discharge: 2013-12-12 | Disposition: A | Discharge status: Home / Self Care | Payer: 59 | Source: Ambulatory Visit | Attending: Nurse Practitioner | Admitting: Nurse Practitioner

## 2013-12-12 DIAGNOSIS — F0391 Unspecified dementia with behavioral disturbance: Secondary | ICD-10-CM

## 2013-12-12 DIAGNOSIS — F03918 Unspecified dementia, unspecified severity, with other behavioral disturbance: Secondary | ICD-10-CM

## 2013-12-12 DIAGNOSIS — I679 Cerebrovascular disease, unspecified: Secondary | ICD-10-CM

## 2013-12-16 ENCOUNTER — Other Ambulatory Visit: Payer: Self-pay | Admitting: Cardiovascular Disease

## 2013-12-18 ENCOUNTER — Other Ambulatory Visit: Payer: Self-pay | Admitting: Cardiovascular Disease

## 2013-12-27 ENCOUNTER — Other Ambulatory Visit: Payer: Self-pay | Admitting: Nurse Practitioner

## 2014-01-17 ENCOUNTER — Other Ambulatory Visit: Payer: Self-pay | Admitting: Cardiovascular Disease

## 2014-02-09 ENCOUNTER — Ambulatory Visit: Payer: Medicare Other | Admitting: Neurology

## 2014-02-09 ENCOUNTER — Telehealth: Payer: Self-pay | Admitting: *Deleted

## 2014-02-09 NOTE — Telephone Encounter (Signed)
Patient's daughter calling in to say that patient is sick and she will not be bringing her in to the appointment but is trying to get an appointment with patient PCP.

## 2014-02-12 ENCOUNTER — Other Ambulatory Visit: Payer: Self-pay

## 2014-02-12 MED ORDER — LISINOPRIL 10 MG PO TABS: 10.0000 mg | ORAL_TABLET | Freq: Every day | ORAL | Status: DC

## 2014-02-12 MED ORDER — CLOPIDOGREL BISULFATE 75 MG PO TABS: ORAL_TABLET | ORAL | Status: DC

## 2014-02-20 ENCOUNTER — Other Ambulatory Visit: Payer: Self-pay | Admitting: *Deleted

## 2014-02-20 MED ORDER — ATORVASTATIN CALCIUM 40 MG PO TABS: ORAL_TABLET | ORAL | Status: DC

## 2014-03-09 ENCOUNTER — Ambulatory Visit (INDEPENDENT_AMBULATORY_CARE_PROVIDER_SITE_OTHER): Payer: Medicare Other | Admitting: Neurology

## 2014-03-09 ENCOUNTER — Telehealth: Payer: Self-pay | Admitting: Neurology

## 2014-03-09 ENCOUNTER — Encounter: Payer: Self-pay | Admitting: Neurology

## 2014-03-09 VITALS — BP 119/67 | HR 60 | Ht 61.6 in | Wt 136.6 lb

## 2014-03-09 DIAGNOSIS — F0391 Unspecified dementia with behavioral disturbance: Secondary | ICD-10-CM

## 2014-03-09 MED ORDER — NAMENDA XR TITRATION PACK 7 & 14 & 21 &28 MG PO CP24: 7.0000 mg | ORAL_CAPSULE | Freq: Every day | ORAL | Status: DC

## 2014-03-09 MED ORDER — MEMANTINE HCL ER 28 MG PO CP24: 28.0000 mg | ORAL_CAPSULE | Freq: Every day | ORAL | Status: DC
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
## 2014-03-09 NOTE — Telephone Encounter (Signed)
Patient's daughter is calling. Patient was seen today by Dr. Pearlean BrownieSethi and he was supposed to give patient a written Rx for Namenda to get filled in one month after finishing samples. The Rx was sent to [facility] and has already been filled. The pharmacy told patient's daughter to call our office and pharmacy could not hold Rx for a month. Pharmacy also said this Rx is a powder that has to be mixed. Patient's daughter has questions. Please call.

## 2014-03-09 NOTE — Patient Instructions (Signed)
I had a long discussion with the patient and her daughter regarding her dementia, behavioral agitation prior strokes, personally reviewed MRI of the limbs and lab results and answered questions. I recommend trial of Namenda and patient was given sample pack of Namenda XR starter kit and a prescription to fill if she can tolerate it. Continue Aricept 10 mg daily. Discontinue Depakote as she is on a small dose and has been unable to tolerate a higher dose. She was also advised about fall risk prevention. Return for follow-up in 2 months with Cecille Rubin, nurse practitioner call earlier if necessary

## 2014-03-09 NOTE — Telephone Encounter (Signed)
I called the pharmacy.  I asked the pharmacist to save the $Remove'28mg'knPibpX$  maintenance dose on file for patient to fill after the titration pack to avoid confusion.  He was agreeable to this.  Says they did not tell the patient/caregiver that this med was a powder that has to be mixed.  Says they do not have the titration kit in stock, and told them they will have to order it for arrival tomorrow.  I called the daughter back.  Relayed conversation with pharmacy.  She verbalized understanding.  Says Dr Leonie Man gave them a starter pack, so they will not need that Rx.  I called the pharmacy back.  They will cancel order of titration kit since it is not needed.

## 2014-03-10 ENCOUNTER — Telehealth: Payer: Self-pay | Admitting: *Deleted

## 2014-03-10 DIAGNOSIS — Z0289 Encounter for other administrative examinations: Secondary | ICD-10-CM

## 2014-03-10 NOTE — Telephone Encounter (Signed)
Received form on 03/10/14

## 2014-03-10 NOTE — Telephone Encounter (Signed)
Form, [employer] to [place]  And Dr Pearlean BrownieSethi to be completed 03-10-14.

## 2014-03-11 ENCOUNTER — Ambulatory Visit: Payer: Medicare Other | Admitting: Neurology

## 2014-03-12 ENCOUNTER — Ambulatory Visit (INDEPENDENT_AMBULATORY_CARE_PROVIDER_SITE_OTHER): Payer: Medicare Other | Admitting: Cardiovascular Disease

## 2014-03-12 ENCOUNTER — Encounter: Payer: Self-pay | Admitting: Cardiovascular Disease

## 2014-03-12 DIAGNOSIS — I2510 Atherosclerotic heart disease of native coronary artery without angina pectoris: Secondary | ICD-10-CM | POA: Diagnosis not present

## 2014-03-12 DIAGNOSIS — I10 Essential (primary) hypertension: Secondary | ICD-10-CM

## 2014-03-12 DIAGNOSIS — E785 Hyperlipidemia, unspecified: Secondary | ICD-10-CM | POA: Diagnosis not present

## 2014-03-12 LAB — HEPATIC FUNCTION PANEL
ALT: 24 U/L (ref 0–35)
AST: 27 U/L (ref 0–37)
Albumin: 3.7 g/dL (ref 3.5–5.2)
Alkaline Phosphatase: 77 U/L (ref 39–117)
BILIRUBIN DIRECT: 0 mg/dL (ref 0.0–0.3)
Total Bilirubin: 1.1 mg/dL (ref 0.2–1.2)
Total Protein: 7 g/dL (ref 6.0–8.3)

## 2014-03-12 LAB — BASIC METABOLIC PANEL
BUN: 16 mg/dL (ref 6–23)
CHLORIDE: 108 meq/L (ref 96–112)
CO2: 26 meq/L (ref 19–32)
Calcium: 9.6 mg/dL (ref 8.4–10.5)
Creatinine, Ser: 0.6 mg/dL (ref 0.4–1.2)
GFR: 96.32 mL/min (ref >60.00)
Glucose, Bld: 99 mg/dL (ref 70–99)
POTASSIUM: 4.7 meq/L (ref 3.5–5.1)
Sodium: 141 mEq/L (ref 135–145)

## 2014-03-12 LAB — LIPID PANEL
Cholesterol: 142 mg/dL (ref 0–200)
HDL: 48.1 mg/dL (ref >39.00)
LDL Cholesterol: 67 mg/dL (ref 0–99)
NONHDL: 93.9
TRIGLYCERIDES: 137 mg/dL (ref 0.0–149.0)
Total CHOL/HDL Ratio: 3
VLDL: 27.4 mg/dL (ref 0.0–40.0)

## 2014-03-12 MED ORDER — NITROGLYCERIN 0.4 MG SL SUBL: 0.4000 mg | SUBLINGUAL_TABLET | SUBLINGUAL | Status: DC | PRN

## 2014-03-12 NOTE — Assessment & Plan Note (Signed)
Will check labs today Will see her in 1 year

## 2014-03-12 NOTE — Patient Instructions (Signed)
Your physician recommends that you continue on your current medications as directed. Please refer to the Current Medication list given to you today.  Your physician recommends that you have lab work: TODAY - cholesterol, liver, bmet  Your physician wants you to follow-up in: 1 year with Dr. Nahser.  You will receive a reminder letter in the mail two months in advance. If you don't receive a letter, please call our office to schedule the follow-up appointment.  

## 2014-03-12 NOTE — Assessment & Plan Note (Signed)
bp is well controlled.  Continue current meds

## 2014-03-12 NOTE — Assessment & Plan Note (Signed)
Continue with asa and Plavix. She has stopped smoking.   No additional recommendations

## 2014-03-12 NOTE — Assessment & Plan Note (Signed)
[name]  has done well. She's not having episodes of chest pain or shortness of breath. We will check a fasting labs today. We will refill her nitroglycerin.  I will see her  again in one year for followup visit.

## 2014-03-12 NOTE — Progress Notes (Signed)
Stacey BruntBetty [name] Date of Birth  02/17/1941 [facility]     [place] [facility]  [address]    [address]   [address] [place], [place]  [id number]    [place], [place]  [id number] [phone]  Fax  [phone]  [phone]  Fax [phone]  Problems: 1. Coronary artery disease-status post PTCA and stenting of her left anterior descending artery 2. Stroke 3. Hypertension 4. Hyperlipidemia  History of Present Illness:  Stacey [name] is doing very well. She has a history of coronary artery disease. She's also had a major stroke. Her speech has gradually improved.  She's walking some.  she's not had any episodes of angina.  October 25, 2012:  Stacey [name]  Is doing well.  She is staying inside because of the heat.    Nov. 12, 2015:  Stacey [name] is doing well. He's not having episodes of chest pain or shortness of breath. She gets some exercise. Her memory is gradually getting slightly worse. She quit smoking several years ago at the same time she had her stroke.   Current Outpatient Prescriptions on File Prior to Visit  Medication Sig Dispense Refill  . atorvastatin (LIPITOR) 40 MG tablet TAKE 1 TABLET BY MOUTH EVERY DAY 30 tablet 0  . clopidogrel (PLAVIX) 75 MG tablet TAKE 1 TABLET BY MOUTH EVERY DAY 30 tablet 0  . divalproex (DEPAKOTE) 125 MG DR tablet TAKE 2 TABLETS EVERY DAY 60 tablet 6  . donepezil (ARICEPT) 10 MG tablet Take 1 tablet (10 mg total) by mouth at bedtime. 90 tablet 3  . lisinopril (PRINIVIL,ZESTRIL) 10 MG tablet Take 1 tablet (10 mg total) by mouth daily. 30 tablet 0  . Memantine HCl ER 28 MG CP24 Take 28 mg by mouth daily. 30 capsule 5  . metoprolol tartrate (LOPRESSOR) 25 MG tablet Take 1 tablet (25 mg total) by mouth 2 (two) times daily. 180 tablet 4  . NAMENDA XR TITRATION PACK 7 & 14 & 21 &28 MG CP24 Take 7 mg by mouth daily. 1 capsule 0   No current facility-administered medications on file prior to visit.    Allergies  Allergen Reactions  . Seroquel [Quetiapine  Fumarate] Other (See Comments)    Next day after taking medication, patient was hard to wake up from her sleep    Past Medical History  Diagnosis Date  . Hypertension   . Hyperlipidemia   . Coronary artery disease 2000    STATUS POT PTCA ND STENTING OF THE LAD   . CVA (cerebrovascular accident) 2010    Past Surgical History  Procedure Laterality Date  . None      History  Smoking status  . Former Smoker  . Quit date: 05/01/2008  Smokeless tobacco  . Never Used    History  Alcohol Use No    Family History  Problem Relation Age of Onset  . Alzheimer's disease Mother     Reviw of Systems:  Reviewed in the HPI.  All other systems are negative.  Physical Exam: Blood pressure 120/68, pulse 50, height 5\' 1"  (1.549 m), weight 134 lb (60.782 kg). General: Well developed, well nourished, in no acute distress.  Head: Normocephalic, atraumatic, sclera non-icteric, mucus membranes are moist,   Neck: Supple. Carotids are 2 + without bruits. No JVD  Lungs: Clear bilaterally to auscultation.  Heart: regular rate.  normal  S1 S2. No murmurs, gallops or rubs.  Abdomen: Soft, non-tender, non-distended with normal bowel sounds. No hepatomegaly. No rebound/guarding. No masses.  Msk:  Strength and tone are normal  Extremities: No clubbing or cyanosis. No edema.  Distal pedal pulses are 2+ and equal bilaterally.  Neuro: Alert and oriented X 3. Moves all extremities spontaneously.  Psych:  Responds to questions appropriately with a normal affect.  ECG: Nov. 12, 2015: Sinus brady at 50, NS T wave abn.    Assessment / Plan:

## 2014-03-16 ENCOUNTER — Other Ambulatory Visit: Payer: Self-pay | Admitting: Cardiovascular Disease

## 2014-03-16 NOTE — Telephone Encounter (Signed)
Form,[employer] received from Dr Pearlean BrownieSethi and [place] completed,patient will pick up at front desk 03-16-14.

## 2014-03-16 NOTE — Telephone Encounter (Signed)
Patient's daughter Stacey BonitoMaria [name] @ [phone], calling to check status on [employer] form.  Explained form should have been completed by the 13th of October.  Please call and advise.

## 2014-03-24 ENCOUNTER — Other Ambulatory Visit: Payer: Self-pay

## 2014-03-24 MED ORDER — ATORVASTATIN CALCIUM 40 MG PO TABS: ORAL_TABLET | ORAL | Status: DC

## 2014-04-02 ENCOUNTER — Telehealth: Payer: Self-pay | Admitting: Cardiovascular Disease

## 2014-04-02 NOTE — Telephone Encounter (Signed)
Copies mailed to patient's address per request

## 2014-04-02 NOTE — Telephone Encounter (Signed)
New message     Daughter keeps calling to try and get moms test results---she is not available during the day.  Please mail patient lab results and any comments.

## 2014-05-07 ENCOUNTER — Other Ambulatory Visit: Payer: Self-pay | Admitting: Cardiovascular Disease

## 2014-05-22 ENCOUNTER — Ambulatory Visit: Payer: Medicare Other | Admitting: Neurology

## 2014-10-20 ENCOUNTER — Other Ambulatory Visit: Payer: Self-pay | Admitting: Cardiology

## 2014-10-21 ENCOUNTER — Other Ambulatory Visit: Payer: Self-pay | Admitting: *Deleted

## 2014-10-21 MED ORDER — ATORVASTATIN CALCIUM 40 MG PO TABS: ORAL_TABLET | ORAL | Status: DC

## 2014-10-26 ENCOUNTER — Other Ambulatory Visit: Payer: Self-pay

## 2014-12-14 ENCOUNTER — Emergency Department (HOSPITAL_COMMUNITY): Payer: Medicare Other

## 2014-12-14 ENCOUNTER — Emergency Department (HOSPITAL_COMMUNITY)
Admission: EM | Admit: 2014-12-14 | Discharge: 2014-12-14 | Disposition: A | Discharge status: Home / Self Care | Payer: Medicare Other | Attending: Emergency Medicine | Admitting: Emergency Medicine

## 2014-12-14 ENCOUNTER — Encounter (HOSPITAL_COMMUNITY): Payer: Self-pay | Admitting: Emergency Medicine

## 2014-12-14 DIAGNOSIS — I10 Essential (primary) hypertension: Secondary | ICD-10-CM | POA: Insufficient documentation

## 2014-12-14 DIAGNOSIS — E785 Hyperlipidemia, unspecified: Secondary | ICD-10-CM | POA: Diagnosis not present

## 2014-12-14 DIAGNOSIS — Z79899 Other long term (current) drug therapy: Secondary | ICD-10-CM | POA: Diagnosis not present

## 2014-12-14 DIAGNOSIS — W010XXA Fall on same level from slipping, tripping and stumbling without subsequent striking against object, initial encounter: Secondary | ICD-10-CM | POA: Insufficient documentation

## 2014-12-14 DIAGNOSIS — Y998 Other external cause status: Secondary | ICD-10-CM | POA: Insufficient documentation

## 2014-12-14 DIAGNOSIS — I2510 Atherosclerotic heart disease of native coronary artery without angina pectoris: Secondary | ICD-10-CM | POA: Insufficient documentation

## 2014-12-14 DIAGNOSIS — S4991XA Unspecified injury of right shoulder and upper arm, initial encounter: Secondary | ICD-10-CM | POA: Diagnosis present

## 2014-12-14 DIAGNOSIS — Y9389 Activity, other specified: Secondary | ICD-10-CM | POA: Insufficient documentation

## 2014-12-14 DIAGNOSIS — Z8673 Personal history of transient ischemic attack (TIA), and cerebral infarction without residual deficits: Secondary | ICD-10-CM | POA: Insufficient documentation

## 2014-12-14 DIAGNOSIS — Z87891 Personal history of nicotine dependence: Secondary | ICD-10-CM | POA: Diagnosis not present

## 2014-12-14 DIAGNOSIS — Y9289 Other specified places as the place of occurrence of the external cause: Secondary | ICD-10-CM | POA: Diagnosis not present

## 2014-12-14 DIAGNOSIS — S43014A Anterior dislocation of right humerus, initial encounter: Secondary | ICD-10-CM | POA: Insufficient documentation

## 2014-12-14 MED ORDER — LIDOCAINE HCL (PF) 1 % IJ SOLN
10.0000 mL | Freq: Once | INTRAMUSCULAR | Status: AC
  Administered 2014-12-14: 10 mL
  Filled 2014-12-14: qty 10

## 2014-12-14 NOTE — ED Provider Notes (Signed)
CSN: [id number]     Arrival date & time 12/14/14  0746 History   First MD Initiated Contact with Patient 12/14/14 [phone]     No chief complaint on file.    (Consider location/radiation/quality/duration/timing/severity/associated sxs/prior Treatment) Patient is a 74 y.o. female presenting with shoulder injury.  Shoulder Injury     74yF with R shoulder pain. Pt received of fentanyl prior to arrival and is currently drowsy. Apparently slipped on floor just prior to arrival. Worse with movement. Denies pain anywhere else. No numbness or tingling. Doesn't think hit head. Denies HA. Is on plavix.   Past Medical History  Diagnosis Date  . Hypertension   . Hyperlipidemia   . Coronary artery disease 2000    STATUS POT PTCA ND STENTING OF THE LAD   . CVA (cerebrovascular accident) 2010   Past Surgical History  Procedure Laterality Date  . None     Family History  Problem Relation Age of Onset  . Alzheimer's disease Mother    Social History  Substance Use Topics  . Smoking status: Former Smoker    Quit date: 05/01/2008  . Smokeless tobacco: Never Used  . Alcohol Use: No   OB History    No data available     Review of Systems  All systems reviewed and negative, other than as noted in HPI.   Allergies  Seroquel  Home Medications   Prior to Admission medications   Medication Sig Start Date End Date Taking? Authorizing Provider  atorvastatin (LIPITOR) 40 MG tablet TAKE 1 TABLET BY MOUTH EVERY DAY 10/21/14   Vesta Mixer, MD  clopidogrel (PLAVIX) 75 MG tablet TAKE 1 TABLET BY MOUTH EVERY DAY 02/12/14   Vesta Mixer, MD  clopidogrel (PLAVIX) 75 MG tablet TAKE 1 TABLET BY MOUTH EVERY DAY 03/17/14   Vesta Mixer, MD  divalproex (DEPAKOTE) 125 MG DR tablet TAKE 2 TABLETS EVERY DAY    Micki Riley, MD  donepezil (ARICEPT) 10 MG tablet Take 1 tablet (10 mg total) by mouth at bedtime. 12/28/13   Ronal Fear, NP  lisinopril (PRINIVIL,ZESTRIL) 10 MG tablet TAKE 1  TABLET (10 MG TOTAL) BY MOUTH DAILY. 03/17/14   Vesta Mixer, MD  Memantine HCl ER 28 MG CP24 Take 28 mg by mouth daily. 03/09/14   Micki Riley, MD  metoprolol tartrate (LOPRESSOR) 25 MG tablet TAKE 1 TABLET BY MOUTH TWICE A DAY 05/07/14   Vesta Mixer, MD  NAMENDA XR TITRATION PACK 7 & 14 & 21 &28 MG CP24 Take 7 mg by mouth daily. 03/09/14   Micki Riley, MD  nitroGLYCERIN (NITROSTAT) 0.4 MG SL tablet Place 1 tablet (0.4 mg total) under the tongue every 5 (five) minutes as needed for chest pain. 03/12/14   Vesta Mixer, MD   There were no vitals taken for this visit. Physical Exam  Constitutional: She appears well-developed and well-nourished. No distress.  HENT:  Head: Normocephalic and atraumatic.  Eyes: Conjunctivae are normal. Right eye exhibits no discharge. Left eye exhibits no discharge.  Neck: Neck supple.  Cardiovascular: Normal rate, regular rhythm and normal heart sounds.  Exam reveals no gallop and no friction rub.   No murmur heard. Pulmonary/Chest: Effort normal and breath sounds normal. No respiratory distress.  Abdominal: Soft. She exhibits no distension. There is no tenderness.  Musculoskeletal:  Deformity R shoulder consistent with anterior dislocation. Surgical scars noted. No bony tenderness. Cannot fully range 2/2 pain. NVI distally.   Neurological:  She is alert.  Skin: Skin is warm and dry.  Psychiatric: She has a normal mood and affect. Her behavior is normal. Thought content normal.  Nursing note and vitals reviewed.   ED Course  Injection of joint - R shoulder Date/Time: 12/14/2014 8:25 AM Performed by: Raeford Razor Authorized by: Raeford Razor Consent: Verbal consent obtained. Consent given by: patient Patient identity confirmed: verbally with patient, arm band and provided demographic data Preparation: Patient was prepped and draped in the usual sterile fashion. Patient tolerance: Patient tolerated the procedure well with no immediate  complications Comments: Anterior dislocation. Lateral approach to inject 10cc of 1% lidocaine.    (including critical care time)  Reduction of dislocation Date/Time: 8:31 AM Performed by: Raeford Razor Authorized by: Raeford Razor Consent: Verbal consent obtained. Risks and benefits: risks, benefits and alternatives were discussed Consent given by: patient Required items: required blood products, implants, devices, and special equipment available Time out: Immediately prior to procedure a "time out" was called to verify the correct patient, procedure, equipment, support staff and site/side marked as required.  Patient sedated: Pt already pretty sedated from fentanyl given prior to arrival. No additional sedating medications given.   Vitals: Vital signs were monitored during sedation. Patient tolerance: Patient tolerated the procedure well with no immediate complications. Joint: R shoulder Reduction technique: external rotation   Labs Review Labs Reviewed - No data to display  Imaging Review Dg Shoulder Right  12/14/2014   CLINICAL DATA:  Postreduction from right shoulder dislocation  EXAM: RIGHT SHOULDER - 2+ VIEW  COMPARISON:  Study obtained earlier in the day; May 21, 2008  FINDINGS: Frontal and Y scapular images obtained. The previously noted anterior dislocation has been reduced successfully. No acute fracture or dislocation is evident. There is evidence of previous surgery involving the lateral right clavicle. No erosive change.  IMPRESSION: No acute fracture or dislocation. Evidence of prior surgery involving the lateral right clavicle with widening of the acromioclavicular joint.   Electronically Signed   By: Bretta Bang III M.D.   On: 12/14/2014 09:00   Dg Shoulder Right  12/14/2014   CLINICAL DATA:  Severe shoulder pain on awakening today. No known injury. Initial encounter.  EXAM: RIGHT SHOULDER - 2+ VIEW  COMPARISON:  Radiographs 05/21/2008.  FINDINGS: There is  anterior inferior dislocation of the glenohumeral joint. There is a possible mild Hill-Sachs deformity of the humeral head. There is probable subacromial spurring. Patient appears to be status post distal clavicle resection.  IMPRESSION: Anterior inferior glenohumeral dislocation with possible mild associated Hill-Sachs deformity.   Electronically Signed   By: Carey Bullocks M.D.   On: 12/14/2014 08:07   I, Zamantha Strebel, personally reviewed and evaluated these images and lab results as part of my medical decision-making.   EKG Interpretation None      MDM   Final diagnoses:  Anterior dislocation of shoulder, right, initial encounter   74yF with likely anterior shoulder dislocation. Consider humeral head fx, particularly at this age but doubt. No bony tenderness. No swelling. Deformity consistent with anterior dislocation. Currently comfortable. Will image. Will inject shoulder. Anticipate reduction.   Shoulder reduced. Remains NVI. More awake. Son now at bedside at reports back to baseline. Supplemental oxygen stopped. Will observe for a little longer off of it. DC with sling. Previous shoulder surgery by Dr Thomasena Edis.   Raeford Razor, MD 12/14/14 [phone]

## 2014-12-14 NOTE — Discharge Instructions (Signed)
Shoulder Dislocation  Your shoulder is made up of three bones: the collar bone (clavicle); the shoulder blade (scapula), which includes the socket (glenoid cavity); and the upper arm bone (humerus). Your shoulder joint is the place where these bones meet. Strong, fibrous tissues hold these bones together (ligaments). Muscles and strong, fibrous tissues that connect the muscles to these bones (tendons) allow your arm to move through this joint. The range of motion of your shoulder joint is more extensive than most of your other joints, and the glenoid cavity is very shallow. That is the reason that your shoulder joint is one of the most unstable joints in your body. It is far more prone to dislocation than your other joints. Shoulder dislocation is when your humerus is forced out of your shoulder joint.  CAUSES  Shoulder dislocation is caused by a forceful impact on your shoulder. This impact usually is from an injury, such as a sports injury or a fall.  SYMPTOMS  Symptoms of shoulder dislocation include:  · Deformity of your shoulder.  · Intense pain.  · Inability to move your shoulder joint.  · Numbness, weakness, or tingling around your shoulder joint (your neck or down your arm).  · Bruising or swelling around your shoulder.  DIAGNOSIS  In order to diagnose a dislocated shoulder, your caregiver will perform a physical exam. Your caregiver also may have an X-ray exam done to see if you have any broken bones. Magnetic resonance imaging (MRI) is a procedure that sometimes is done to help your caregiver see any damage to the soft tissues around your shoulder, particularly your rotator cuff tendons. Additionally, your caregiver also may have electromyography done to measure the electrical discharges produced in your muscles if you have signs or symptoms of nerve damage.  TREATMENT  A shoulder dislocation is treated by placing the humerus back in the joint (reduction). Your caregiver does this either manually (closed  reduction), by moving your humerus back into the joint through manipulation, or through surgery (open reduction). When your humerus is back in place, severe pain should improve almost immediately.  You also may need to have surgery if you have a weak shoulder joint or ligaments, and you have recurring shoulder dislocations, despite rehabilitation. In rare cases, surgery is necessary if your nerves or blood vessels are damaged during the dislocation.  After your reduction, your arm will be placed in a shoulder immobilizer or sling to keep it from moving. Your caregiver will have you wear your shoulder immobilizer or sling for 3 days to 3 weeks, depending on how serious your dislocation is. When your shoulder immobilizer or sling is removed, your caregiver may prescribe physical therapy to help improve the range of motion in your shoulder joint.  HOME CARE INSTRUCTIONS   The following measures can help to reduce pain and speed up the healing process:  · Rest your injured joint. Do not move it. Avoid activities similar to the one that caused your injury.  · Apply ice to your injured joint for the first day or two after your reduction or as directed by your caregiver. Applying ice helps to reduce inflammation and pain.  ¨ Put ice in a plastic bag.  ¨ Place a towel between your skin and the bag.  ¨ Leave the ice on for 15-20 minutes at a time, every 2 hours while you are awake.  · Exercise your hand by squeezing a soft ball. This helps to eliminate stiffness and swelling in your hand and   wrist.  · Take over-the-counter or prescription medicine for pain or discomfort as told by your caregiver.  SEEK IMMEDIATE MEDICAL CARE IF:   · Your shoulder immobilizer or sling becomes damaged.  · Your pain becomes worse rather than better.  · You lose feeling in your arm or hand, or they become white and cold.  MAKE SURE YOU:   · Understand these instructions.  · Will watch your condition.  · Will get help right away if you are not  doing well or get worse.  Document Released: 01/10/2001 Document Revised: 09/01/2013 Document Reviewed: 02/05/2011  [employer]® Patient Information ©2015 [employer]. This information is not intended to replace advice given to you by your health care provider. Make sure you discuss any questions you have with your health care provider.

## 2014-12-14 NOTE — ED Notes (Addendum)
Per EMS pt slipped on urine on floor and dislocated right shoulder. 250 mcg Fentanyl and 4 mg Zofran administered en route. Pt disoriented to situation, states "I don't know" when asked what happened. Motor function and sensation intact. MD at bedside.

## 2014-12-14 NOTE — ED Notes (Signed)
Bed: ZO10 Expected date:  Expected time:  Means of arrival:  Comments: EMS- 74yo F, fall, shoulder dislocation

## 2015-03-09 ENCOUNTER — Other Ambulatory Visit: Payer: Self-pay | Admitting: Cardiovascular Disease

## 2015-03-09 ENCOUNTER — Other Ambulatory Visit: Payer: Self-pay | Admitting: Neurology

## 2015-04-08 ENCOUNTER — Other Ambulatory Visit: Payer: Self-pay | Admitting: Cardiovascular Disease

## 2015-04-22 ENCOUNTER — Other Ambulatory Visit: Payer: Self-pay | Admitting: Neurology

## 2015-04-23 ENCOUNTER — Other Ambulatory Visit: Payer: Self-pay

## 2015-04-25 ENCOUNTER — Other Ambulatory Visit: Payer: Self-pay | Admitting: Cardiovascular Disease

## 2015-04-26 ENCOUNTER — Other Ambulatory Visit: Payer: Self-pay | Admitting: Neurology

## 2015-04-30 ENCOUNTER — Other Ambulatory Visit: Payer: Self-pay | Admitting: Neurology

## 2015-05-03 ENCOUNTER — Other Ambulatory Visit: Payer: Self-pay

## 2015-05-04 ENCOUNTER — Other Ambulatory Visit: Payer: Self-pay | Admitting: Neurology

## 2015-05-05 ENCOUNTER — Other Ambulatory Visit: Payer: Self-pay

## 2015-05-07 ENCOUNTER — Other Ambulatory Visit: Payer: Self-pay | Admitting: Neurology

## 2015-05-08 ENCOUNTER — Encounter (HOSPITAL_COMMUNITY): Payer: Self-pay | Admitting: Emergency Medicine

## 2015-05-08 ENCOUNTER — Emergency Department (HOSPITAL_COMMUNITY): Payer: Medicare Other

## 2015-05-08 ENCOUNTER — Observation Stay (HOSPITAL_COMMUNITY): Payer: Medicare Other

## 2015-05-08 ENCOUNTER — Observation Stay (HOSPITAL_COMMUNITY)
Admission: EM | Admit: 2015-05-08 | Discharge: 2015-05-10 | Disposition: A | Discharge status: Skilled Nursing Facility | Payer: Medicare Other | Attending: Internal Medicine | Admitting: Internal Medicine

## 2015-05-08 ENCOUNTER — Other Ambulatory Visit: Payer: Self-pay

## 2015-05-08 ENCOUNTER — Other Ambulatory Visit: Payer: Self-pay | Admitting: Cardiovascular Disease

## 2015-05-08 DIAGNOSIS — F0390 Unspecified dementia without behavioral disturbance: Secondary | ICD-10-CM | POA: Diagnosis not present

## 2015-05-08 DIAGNOSIS — S9031XA Contusion of right foot, initial encounter: Secondary | ICD-10-CM | POA: Insufficient documentation

## 2015-05-08 DIAGNOSIS — M79671 Pain in right foot: Secondary | ICD-10-CM | POA: Diagnosis not present

## 2015-05-08 DIAGNOSIS — Z87891 Personal history of nicotine dependence: Secondary | ICD-10-CM | POA: Diagnosis not present

## 2015-05-08 DIAGNOSIS — I671 Cerebral aneurysm, nonruptured: Secondary | ICD-10-CM

## 2015-05-08 DIAGNOSIS — G451 Carotid artery syndrome (hemispheric): Secondary | ICD-10-CM | POA: Diagnosis not present

## 2015-05-08 DIAGNOSIS — M2011 Hallux valgus (acquired), right foot: Secondary | ICD-10-CM | POA: Insufficient documentation

## 2015-05-08 DIAGNOSIS — R200 Anesthesia of skin: Secondary | ICD-10-CM | POA: Insufficient documentation

## 2015-05-08 DIAGNOSIS — E785 Hyperlipidemia, unspecified: Secondary | ICD-10-CM | POA: Diagnosis not present

## 2015-05-08 DIAGNOSIS — I69351 Hemiplegia and hemiparesis following cerebral infarction affecting right dominant side: Secondary | ICD-10-CM | POA: Diagnosis not present

## 2015-05-08 DIAGNOSIS — G9389 Other specified disorders of brain: Secondary | ICD-10-CM | POA: Diagnosis not present

## 2015-05-08 DIAGNOSIS — I639 Cerebral infarction, unspecified: Secondary | ICD-10-CM

## 2015-05-08 DIAGNOSIS — Y9289 Other specified places as the place of occurrence of the external cause: Secondary | ICD-10-CM | POA: Insufficient documentation

## 2015-05-08 DIAGNOSIS — W19XXXA Unspecified fall, initial encounter: Secondary | ICD-10-CM | POA: Insufficient documentation

## 2015-05-08 DIAGNOSIS — I2510 Atherosclerotic heart disease of native coronary artery without angina pectoris: Secondary | ICD-10-CM | POA: Insufficient documentation

## 2015-05-08 DIAGNOSIS — N390 Urinary tract infection, site not specified: Secondary | ICD-10-CM | POA: Insufficient documentation

## 2015-05-08 DIAGNOSIS — G459 Transient cerebral ischemic attack, unspecified: Principal | ICD-10-CM | POA: Insufficient documentation

## 2015-05-08 DIAGNOSIS — Z7902 Long term (current) use of antithrombotics/antiplatelets: Secondary | ICD-10-CM | POA: Diagnosis not present

## 2015-05-08 DIAGNOSIS — R531 Weakness: Secondary | ICD-10-CM | POA: Insufficient documentation

## 2015-05-08 DIAGNOSIS — I10 Essential (primary) hypertension: Secondary | ICD-10-CM | POA: Diagnosis not present

## 2015-05-08 DIAGNOSIS — Y999 Unspecified external cause status: Secondary | ICD-10-CM | POA: Insufficient documentation

## 2015-05-08 DIAGNOSIS — I63312 Cerebral infarction due to thrombosis of left middle cerebral artery: Secondary | ICD-10-CM | POA: Diagnosis present

## 2015-05-08 DIAGNOSIS — Y939 Activity, unspecified: Secondary | ICD-10-CM | POA: Insufficient documentation

## 2015-05-08 DIAGNOSIS — R001 Bradycardia, unspecified: Secondary | ICD-10-CM | POA: Insufficient documentation

## 2015-05-08 DIAGNOSIS — Z955 Presence of coronary angioplasty implant and graft: Secondary | ICD-10-CM | POA: Insufficient documentation

## 2015-05-08 DIAGNOSIS — I728 Aneurysm of other specified arteries: Secondary | ICD-10-CM | POA: Insufficient documentation

## 2015-05-08 LAB — COMPREHENSIVE METABOLIC PANEL
ALT: 20 U/L (ref 14–54)
AST: 29 U/L (ref 15–41)
Albumin: 3.7 g/dL (ref 3.5–5.0)
Alkaline Phosphatase: 70 U/L (ref 38–126)
Anion gap: 10 (ref 5–15)
BILIRUBIN TOTAL: 0.6 mg/dL (ref 0.3–1.2)
BUN: 15 mg/dL (ref 6–20)
CHLORIDE: 109 mmol/L (ref 101–111)
CO2: 24 mmol/L (ref 22–32)
CREATININE: 0.64 mg/dL (ref 0.44–1.00)
Calcium: 9.4 mg/dL (ref 8.9–10.3)
Glucose, Bld: 110 mg/dL — ABNORMAL HIGH (ref 65–99)
Potassium: 3.8 mmol/L (ref 3.5–5.1)
Sodium: 143 mmol/L (ref 135–145)
TOTAL PROTEIN: 5.9 g/dL — AB (ref 6.5–8.1)

## 2015-05-08 LAB — DIFFERENTIAL
BASOS PCT: 0 %
Basophils Absolute: 0 10*3/uL (ref 0.0–0.1)
EOS ABS: 0.2 10*3/uL (ref 0.0–0.7)
Eosinophils Relative: 2 %
LYMPHS ABS: 1.9 10*3/uL (ref 0.7–4.0)
Lymphocytes Relative: 18 %
MONO ABS: 0.6 10*3/uL (ref 0.1–1.0)
MONOS PCT: 6 %
NEUTROS ABS: 7.6 10*3/uL (ref 1.7–7.7)
Neutrophils Relative %: 74 %

## 2015-05-08 LAB — CBC
HEMATOCRIT: 40.4 % (ref 36.0–46.0)
Hemoglobin: 13.9 g/dL (ref 12.0–15.0)
MCH: 33 pg (ref 26.0–34.0)
MCHC: 34.4 g/dL (ref 30.0–36.0)
MCV: 96 fL (ref 78.0–100.0)
Platelets: 180 10*3/uL (ref 150–400)
RBC: 4.21 MIL/uL (ref 3.87–5.11)
RDW: 12.6 % (ref 11.5–15.5)
WBC: 10.2 10*3/uL (ref 4.0–10.5)

## 2015-05-08 LAB — PROTIME-INR
INR: 1.05 (ref 0.00–1.49)
Prothrombin Time: 13.9 seconds (ref 11.6–15.2)

## 2015-05-08 LAB — APTT: aPTT: 28 seconds (ref 24–37)

## 2015-05-08 LAB — I-STAT TROPONIN, ED: TROPONIN I, POC: 0 ng/mL (ref 0.00–0.08)

## 2015-05-08 LAB — ETHANOL

## 2015-05-08 MED ORDER — METOPROLOL TARTRATE 25 MG PO TABS
25.0000 mg | ORAL_TABLET | Freq: Two times a day (BID) | ORAL | Status: DC
  Administered 2015-05-09 (×2): 25 mg via ORAL
  Filled 2015-05-08 (×2): qty 1

## 2015-05-08 MED ORDER — ENOXAPARIN SODIUM 40 MG/0.4ML ~~LOC~~ SOLN
40.0000 mg | Freq: Every day | SUBCUTANEOUS | Status: DC
  Administered 2015-05-10: 40 mg via SUBCUTANEOUS
  Filled 2015-05-08 (×2): qty 0.4

## 2015-05-08 MED ORDER — STROKE: EARLY STAGES OF RECOVERY BOOK
Freq: Once | Status: DC
  Filled 2015-05-08: qty 1

## 2015-05-08 MED ORDER — NITROGLYCERIN 0.4 MG SL SUBL: 0.4000 mg | SUBLINGUAL_TABLET | SUBLINGUAL | Status: DC | PRN

## 2015-05-08 MED ORDER — DONEPEZIL HCL 10 MG PO TABS
10.0000 mg | ORAL_TABLET | Freq: Every day | ORAL | Status: DC
  Administered 2015-05-09 (×2): 10 mg via ORAL
  Filled 2015-05-08: qty 1
  Filled 2015-05-08: qty 2

## 2015-05-08 MED ORDER — LISINOPRIL 10 MG PO TABS
10.0000 mg | ORAL_TABLET | Freq: Every day | ORAL | Status: DC
  Administered 2015-05-09 – 2015-05-10 (×2): 10 mg via ORAL
  Filled 2015-05-08 (×2): qty 1

## 2015-05-08 MED ORDER — CLOPIDOGREL BISULFATE 75 MG PO TABS
75.0000 mg | ORAL_TABLET | Freq: Every day | ORAL | Status: DC
  Administered 2015-05-09 – 2015-05-10 (×2): 75 mg via ORAL
  Filled 2015-05-08 (×2): qty 1

## 2015-05-08 MED ORDER — ATORVASTATIN CALCIUM 40 MG PO TABS
40.0000 mg | ORAL_TABLET | Freq: Every day | ORAL | Status: DC
  Administered 2015-05-10: 40 mg via ORAL
  Filled 2015-05-08: qty 1

## 2015-05-08 MED ORDER — SENNOSIDES-DOCUSATE SODIUM 8.6-50 MG PO TABS
1.0000 | ORAL_TABLET | Freq: Every evening | ORAL | Status: DC | PRN
  Filled 2015-05-08: qty 1

## 2015-05-08 NOTE — ED Notes (Signed)
Neuro MD at bedside

## 2015-05-08 NOTE — ED Notes (Signed)
Patient went to bathroom, fell in the bathroom and no use of right side of body.  Upon EMS arrival, no weakness seen.  Stroke screen negative and spine screen negative.  She was having some soreness to right knee. Patient has had a history of stroke.  She has resolved of her stroke like symptoms per EMS at this time.  Patient is CAOx4 at this time.

## 2015-05-08 NOTE — H&P (Signed)
[facility] Admission History and Physical       [name] VWU:[id number] DOB: [birth year] DOA: 05/08/2015  Referring physician: EDP PCP: Leanor Rubenstein, MD  Specialists:   Chief Complaint: Right sided Numbness  HPI: [name] is a 75 y.o. female with a history of CVA ( 05/2008), CAD, HTN, Hyperlipidemia and Dementia who presents to the ED with complaints of right sided numbness and confusion with symptoms that started around 5 pm.  Her Daughter witnessed the event where she fell , and had increased weakness of her right side after falling.   EMS was called and she was seen in the ED and evaluated as a Code Stroke initially, and a CT scan of the head was performed which was negative for acute findings but reveals old Left Posterior MCA infarct changes.   Neurology was consulted and she was referred for a TIA Workup.     Review of Systems:  Constitutional: No Weight Loss, No Weight Gain, Night Sweats, Fevers, Chills, Dizziness, Light Headedness, Fatigue, or Generalized Weakness HEENT: No Headaches, Difficulty Swallowing,Tooth/Dental Problems,Sore Throat,  No Sneezing, Rhinitis, Ear Ache, Nasal Congestion, or Post Nasal Drip,  Cardio-vascular:  No Chest pain, Orthopnea, PND, Edema in Lower Extremities, Anasarca, Dizziness, Palpitations  Resp: No Dyspnea, No DOE, No Productive Cough, No Non-Productive Cough, No Hemoptysis, No Wheezing.    GI: No Heartburn, Indigestion, Abdominal Pain, Nausea, Vomiting, Diarrhea, Constipation, Hematemesis, Hematochezia, Melena, Change in Bowel Habits,  Loss of Appetite  GU: No Dysuria, No Change in Color of Urine, No Urgency or Urinary Frequency, No Flank pain.  Musculoskeletal: No Joint Pain or Swelling, No Decreased Range of Motion, No Back Pain.  Neurologic: No Syncope, No Seizures, +Right Sided Paresthesia, Vision Disturbance or Loss, No Diplopia, No Vertigo, No Difficulty Walking,  Skin: No Rash or Lesions. Psych: No Change in Mood or Affect, No  Depression or Anxiety, No Memory loss, No Confusion, or Hallucinations   Past Medical History  Diagnosis Date  . Hypertension   . Hyperlipidemia   . Coronary artery disease 2000    STATUS POT PTCA ND STENTING OF THE LAD   . CVA (cerebrovascular accident) (HCC) 2010     Past Surgical History  Procedure Laterality Date  . None        Prior to Admission medications   Medication Sig Start Date End Date Taking? Authorizing Provider  ARICEPT 10 MG tablet TAKE 1 TABLET BY MOUTH EVERY DAY AT BEDTIME 04/29/15  Yes Micki Riley, MD  atorvastatin (LIPITOR) 40 MG tablet TAKE 1 TABLET BY MOUTH EVERY DAY 10/21/14  Yes Vesta Mixer, MD  clopidogrel (PLAVIX) 75 MG tablet TAKE 1 TABLET BY MOUTH EVERY DAY 03/10/15  Yes Vesta Mixer, MD  lisinopril (PRINIVIL,ZESTRIL) 10 MG tablet TAKE 1 TABLET EVERY DAY 03/10/15  Yes Vesta Mixer, MD  metoprolol tartrate (LOPRESSOR) 25 MG tablet TAKE 1 TABLET BY MOUTH TWICE A DAY 05/07/14  Yes Vesta Mixer, MD  clopidogrel (PLAVIX) 75 MG tablet TAKE 1 TABLET BY MOUTH EVERY DAY 04/08/15   Vesta Mixer, MD  divalproex (DEPAKOTE) 125 MG DR tablet TAKE 2 TABLETS EVERY DAY    Micki Riley, MD  lisinopril (PRINIVIL,ZESTRIL) 10 MG tablet Take 1 tablet (10 mg total) by mouth daily. 04/08/15   Vesta Mixer, MD  Memantine HCl ER 28 MG CP24 Take 28 mg by mouth daily. 03/09/14   Micki Riley, MD  NAMENDA XR TITRATION PACK 7 & 14 & 21 &28  MG CP24 Take 7 mg by mouth daily. 03/09/14   Micki RileyPramod S Sethi, MD  nitroGLYCERIN (NITROSTAT) 0.4 MG SL tablet Place 1 tablet (0.4 mg total) under the tongue every 5 (five) minutes as needed for chest pain. 03/12/14   Vesta MixerPhilip J Nahser, MD     Allergies  Allergen Reactions  . Seroquel [Quetiapine Fumarate] Other (See Comments)    Next day after taking medication, patient was hard to wake up from her sleep    Social History:  reports that she quit smoking about 7 years ago. She has never used smokeless tobacco. She reports that  she does not drink alcohol or use illicit drugs.    Family History  Problem Relation Age of Onset  . Alzheimer's disease Mother        Physical Exam:  GEN:  Pleasant Obese Elderly  75 y.o. Caucasian female examined and in no acute distress; cooperative with exam Filed Vitals:   05/08/15 1945 05/08/15 2058  BP: 150/58 139/61  Pulse: 56 60  Temp: 98.3 F (36.8 C)   TempSrc: Oral   Resp: 20 17  SpO2: 96% 97%   Blood pressure 139/61, pulse 60, temperature 98.3 F (36.8 C), temperature source Oral, resp. rate 17, SpO2 97 %. PSYCH: She is alert and oriented x4; does not appear anxious does not appear depressed; affect is normal HEENT: Normocephalic and Atraumatic, Mucous membranes pink; PERRLA; EOM intact; Fundi:  Benign;  No scleral icterus, Nares: Patent, Oropharynx: Clear, Edentulous with Dentures Present,    Neck:  FROM, No Cervical Lymphadenopathy nor Thyromegaly or Carotid Bruit; No JVD; Breasts:: Not examined CHEST WALL: No tenderness CHEST: Normal respiration, clear to auscultation bilaterally HEART: Regular rate and rhythm; no murmurs rubs or gallops BACK: No kyphosis or scoliosis; No CVA tenderness ABDOMEN: Positive Bowel Sounds, Obese, Soft Non-Tender, No Rebound or Guarding; No Masses, No Organomegaly Rectal Exam: Not done EXTREMITIES: No Cyanosis, Clubbing, or Edema; No Ulcerations. Genitalia: not examined PULSES: 2+ and symmetric SKIN: Normal hydration no rash or ulceration CNS:  Alert and Oriented x 4, Mild Weakness of the RUE and RLE 4/5 ( Chronic versus Acute) Vascular: pulses palpable throughout    Labs on Admission:  Basic Metabolic Panel:  Recent Labs Lab 05/08/15 2012  NA 143  K 3.8  CL 109  CO2 24  GLUCOSE 110*  BUN 15  CREATININE 0.64  CALCIUM 9.4   Liver Function Tests:  Recent Labs Lab 05/08/15 2012  AST 29  ALT 20  ALKPHOS 70  BILITOT 0.6  PROT 5.9*  ALBUMIN 3.7   No results for input(s): LIPASE, AMYLASE in the last 168  hours. No results for input(s): AMMONIA in the last 168 hours. CBC:  Recent Labs Lab 05/08/15 2012  WBC 10.2  NEUTROABS 7.6  HGB 13.9  HCT 40.4  MCV 96.0  PLT 180   Cardiac Enzymes: No results for input(s): CKTOTAL, CKMB, CKMBINDEX, TROPONINI in the last 168 hours.  BNP (last 3 results) No results for input(s): BNP in the last 8760 hours.  ProBNP (last 3 results) No results for input(s): PROBNP in the last 8760 hours.  CBG: No results for input(s): GLUCAP in the last 168 hours.  Radiological Exams on Admission: Ct Head Wo Contrast  05/08/2015  CLINICAL DATA:  Right-sided weakness and altered mental status. History of old infarct. EXAM: CT HEAD WITHOUT CONTRAST TECHNIQUE: Contiguous axial images were obtained from the base of the skull through the vertex without intravenous contrast. COMPARISON:  Brain MRI, 12/12/2013.  Head CT, 06/20/2011. FINDINGS: Ventricles are normal configuration. There is ex vacuo dilation of the posterior left lateral ventricle due to an old left sided infarct that involves the majority of the left parietal lobe posterior left temporal lobe and portions of the left occipital lobe. This is stable from the prior exams. There is no hydrocephalus. There are no parenchymal masses or mass effect. There is no evidence of a recent cortical infarct. Other patchy areas of white matter hypoattenuation are noted consistent with moderate chronic microvascular ischemic change, also stable. There are no extra-axial masses or abnormal fluid collections. There is no intracranial hemorrhage. Visualized sinuses and mastoid air cells are clear. IMPRESSION: 1. No acute intracranial abnormalities. 2. Old left posterior MCA distribution infarct. 3. Moderate chronic microvascular ischemic change. 4. Stable appearance from the prior head CT and brain MRI. Electronically Signed   By: Amie Portland M.D.   On: 05/08/2015 20:23     EKG: Independently reviewed. Normal Sinus rhythm rate = 61  No Acute Changes    Assessment/Plan:    75 y.o. female with  Principal Problem:   1.     TIA (transient ischemic attack)   TIA workup   Cardiac Monitoring   Neuro checks   ASA , Continue Plavix   Continue Atorvastatin Rx   MRI/MRA of Brain ordered   Carotid US, and 2D ECHO in AM   Check HbA1c and Fasting lipids in AM   Neurology consulted to see      Active Problems:   2.    Weakness- due to #1      3.    Hypertension   On Lisinopril Rx   Monitor BPs     4.    Hyperlipidemia   Continue Atorvastatin Rx     5.    Coronary artery disease   Continue ASA, Plavix, Atorvastatin, Lisinopril     6.    CVA (cerebrovascular accident) (HCC)   Hx      7.    Dementia   On Aricept Rx      8.    DVT Prophylaxis   Lovenox     Code Status:     FULL CODE       Family Communication:   Son at Bedside   Disposition Plan:  Observation Status        Time spent:   70 Minutes      Ron Parker [facility] Pager [phone]   If 7AM -7PM Please Contact the Day Rounding Team MD for [facility]  If 7PM-7AM, Please Contact Night-Floor Coverage  [email or web address] Password [email or web address] 05/08/2015, 9:31 PM     ADDENDUM:   Patient was seen and examined on 05/08/2015

## 2015-05-08 NOTE — ED Provider Notes (Signed)
CSN: [id number]     Arrival date & time 05/08/15  1932 History   First MD Initiated Contact with Patient 05/08/15 1943     Chief Complaint  Patient presents with  . Weakness     The history is provided by the patient, the EMS personnel and a relative. No language interpreter was used.   [name] is a 75 y.o. female who presents to the Emergency Department complaining of right-sided weakness. She has a history of prior CVA with right-sided weakness in the past. Today she was at her routine set of health when she went up to use the bathroom and developed severe right-sided weakness that resulted in her collapsing to the ground. She was weak on the right side at home for family. On ED arrival her weakness has resolved but she reports feeling funny in her right side. Her prior stroke affected the right side of her body. She takes Plavix. She has a history of dementia. No recent illness or injuries. Symptoms are moderate, constant, improving.  Past Medical History  Diagnosis Date  . Hypertension   . Hyperlipidemia   . Coronary artery disease 2000    STATUS POT PTCA ND STENTING OF THE LAD   . CVA (cerebrovascular accident) (HCC) 2010   Past Surgical History  Procedure Laterality Date  . None     Family History  Problem Relation Age of Onset  . Alzheimer's disease Mother    Social History  Substance Use Topics  . Smoking status: Former Smoker    Quit date: 05/01/2008  . Smokeless tobacco: Never Used  . Alcohol Use: No   OB History    No data available     Review of Systems  All other systems reviewed and are negative.     Allergies  Seroquel  Home Medications   Prior to Admission medications   Medication Sig Start Date End Date Taking? Authorizing Provider  ARICEPT 10 MG tablet TAKE 1 TABLET BY MOUTH EVERY DAY AT BEDTIME 04/29/15  Yes Micki RileyPramod S Sethi, MD  atorvastatin (LIPITOR) 40 MG tablet TAKE 1 TABLET BY MOUTH EVERY DAY 10/21/14  Yes Vesta MixerPhilip J Nahser, MD  clopidogrel  (PLAVIX) 75 MG tablet TAKE 1 TABLET BY MOUTH EVERY DAY 03/10/15  Yes Vesta MixerPhilip J Nahser, MD  lisinopril (PRINIVIL,ZESTRIL) 10 MG tablet TAKE 1 TABLET EVERY DAY 03/10/15  Yes Vesta MixerPhilip J Nahser, MD  metoprolol tartrate (LOPRESSOR) 25 MG tablet TAKE 1 TABLET BY MOUTH TWICE A DAY 05/07/14  Yes Vesta MixerPhilip J Nahser, MD  clopidogrel (PLAVIX) 75 MG tablet TAKE 1 TABLET BY MOUTH EVERY DAY 04/08/15   Vesta MixerPhilip J Nahser, MD  divalproex (DEPAKOTE) 125 MG DR tablet TAKE 2 TABLETS EVERY DAY    Micki RileyPramod S Sethi, MD  lisinopril (PRINIVIL,ZESTRIL) 10 MG tablet Take 1 tablet (10 mg total) by mouth daily. 04/08/15   Vesta MixerPhilip J Nahser, MD  Memantine HCl ER 28 MG CP24 Take 28 mg by mouth daily. 03/09/14   Micki RileyPramod S Sethi, MD  NAMENDA XR TITRATION PACK 7 & 14 & 21 &28 MG CP24 Take 7 mg by mouth daily. 03/09/14   Micki RileyPramod S Sethi, MD  nitroGLYCERIN (NITROSTAT) 0.4 MG SL tablet Place 1 tablet (0.4 mg total) under the tongue every 5 (five) minutes as needed for chest pain. 03/12/14   Vesta MixerPhilip J Nahser, MD   BP 136/72 mmHg  Pulse 58  Temp(Src) 98.8 F (37.1 C) (Oral)  Resp 23  SpO2 95% Physical Exam  Constitutional: She appears well-developed and  well-nourished.  HENT:  Head: Normocephalic and atraumatic.  Eyes: Pupils are equal, round, and reactive to light.  Cardiovascular: Normal rate and regular rhythm.   No murmur heard. Pulmonary/Chest: Effort normal and breath sounds normal. No respiratory distress.  Abdominal: Soft. There is no tenderness. There is no rebound and no guarding.  Musculoskeletal: She exhibits no edema or tenderness.  Neurological: She is alert.  Mildly confused, disoriented to time. Altered sensation to light touch in the right upper and right lower extremity with 5 out of 5 strength in all 4 extremities.  Skin: Skin is warm and dry.  Psychiatric: She has a normal mood and affect. Her behavior is normal.  Nursing note and vitals reviewed.   ED Course  Procedures (including critical care time) Labs Review Labs  Reviewed  COMPREHENSIVE METABOLIC PANEL - Abnormal; Notable for the following:    Glucose, Bld 110 (*)    Total Protein 5.9 (*)    All other components within normal limits  ETHANOL  PROTIME-INR  APTT  CBC  DIFFERENTIAL  URINE RAPID DRUG SCREEN, HOSP PERFORMED  URINALYSIS, ROUTINE W REFLEX MICROSCOPIC (NOT AT [facility])  HEMOGLOBIN A1C  LIPID PANEL  HEMOGLOBIN A1C  LIPID PANEL  I-STAT TROPOININ, ED    Imaging Review Ct Head Wo Contrast  05/08/2015  CLINICAL DATA:  Right-sided weakness and altered mental status. History of old infarct. EXAM: CT HEAD WITHOUT CONTRAST TECHNIQUE: Contiguous axial images were obtained from the base of the skull through the vertex without intravenous contrast. COMPARISON:  Brain MRI, 12/12/2013.  Head CT, 06/20/2011. FINDINGS: Ventricles are normal configuration. There is ex vacuo dilation of the posterior left lateral ventricle due to an old left sided infarct that involves the majority of the left parietal lobe posterior left temporal lobe and portions of the left occipital lobe. This is stable from the prior exams. There is no hydrocephalus. There are no parenchymal masses or mass effect. There is no evidence of a recent cortical infarct. Other patchy areas of white matter hypoattenuation are noted consistent with moderate chronic microvascular ischemic change, also stable. There are no extra-axial masses or abnormal fluid collections. There is no intracranial hemorrhage. Visualized sinuses and mastoid air cells are clear. IMPRESSION: 1. No acute intracranial abnormalities. 2. Old left posterior MCA distribution infarct. 3. Moderate chronic microvascular ischemic change. 4. Stable appearance from the prior head CT and brain MRI. Electronically Signed   By: Amie Portland M.D.   On: 05/08/2015 20:23   I have personally reviewed and evaluated these images and lab results as part of my medical decision-making.   EKG Interpretation   Date/Time:  Saturday May 08 2015 19:41:43 EST Ventricular Rate:  61 PR Interval:  167 QRS Duration: 96 QT Interval:  482 QTC Calculation: 486 R Axis:   -36 Text Interpretation:  Sinus rhythm Abnormal R-wave progression, early  transition LVH with secondary repolarization abnormality Borderline  prolonged QT interval Confirmed by Lincoln Brigham [phone]) on 05/08/2015 7:57:30  PM      MDM   Final diagnoses:  TIA (transient ischemic attack)  TIA (transient ischemic attack)  TIA (transient ischemic attack)  TIA (transient ischemic attack)    Patient with history of CVA here for evaluation of right-sided weakness and numbness, resolved upon ED arrival. Discussed with neurology and hospitalist. Plan to admit for TIA evaluation.    Tilden Fossa, MD 05/08/15 2337

## 2015-05-08 NOTE — Consult Note (Signed)
Referring Physician: Dr Ralene Bathe    Chief Complaint: fall x 2 , worsened  right sided weakness, unable to walk  HPI:                                                                                                                                         [name] is an 75 y.o. 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  Patient was in her usual state of health until this evening when she went to the bathroom, her right side became " weak and useless" and she fell to the floor. She was able to get up by herself but then had another episode with similar characteristics. She lives by herself but was able to call her daughter on the phone and EMS was summoned. By the time EMS arrived to the scene the right sided weakness had resolved. [name] denies associated HA, vertigo, double vision, difficulty swallowing, slurred speech, language or vision impairment. On plavix. CT brain was personally reviewed and showed no acute abnormality. Reviewed available labs are results are unrevealing.  Date last known well: 05/08/15 Time last known well: 3 pm tPA Given: no, out of the window   Past Medical History  Diagnosis Date  . Hypertension   . Hyperlipidemia   . Coronary artery disease 2000    STATUS POT [facility] [place] STENTING OF THE LAD   . CVA (cerebrovascular accident) ([place]) 2010    Past Surgical History  Procedure Laterality Date  . None      Family History  Problem Relation Age of Onset  . Alzheimer's disease Mother    Social History:  reports that she quit smoking about 7 years ago. She has never used smokeless tobacco. She reports that she does not drink alcohol or use illicit drugs. Family history: no MS, epilepsy, or brain tumor Allergies:  Allergies  Allergen Reactions  . Seroquel [Quetiapine Fumarate] Other (See  Comments)    Next day after taking medication, patient was hard to wake up from her sleep    Medications:  I have reviewed the patient's current medications.  ROS:                                                                                                                                       History obtained from family, chart review and the patient  General ROS: negative for - chills, fatigue, fever, night sweats, weight gain or weight loss Psychological ROS: negative for - behavioral disorder, hallucinations, memory difficulties, mood swings or suicidal ideation Ophthalmic ROS: negative for - blurry vision, double vision, eye pain or loss of vision ENT ROS: negative for - epistaxis, nasal discharge, oral lesions, sore throat, tinnitus or vertigo Allergy and Immunology ROS: negative for - hives or itchy/watery eyes Hematological and Lymphatic ROS: negative for - bleeding problems, bruising or swollen lymph nodes Endocrine ROS: negative for - galactorrhea, hair pattern changes, polydipsia/polyuria or temperature intolerance Respiratory ROS: negative for - cough, hemoptysis, shortness of breath or wheezing Cardiovascular ROS: negative for - chest pain, dyspnea on exertion, edema or irregular heartbeat Gastrointestinal ROS: negative for - abdominal pain, diarrhea, hematemesis, nausea/vomiting or stool incontinence Genito-Urinary ROS: negative for - dysuria, hematuria, incontinence or urinary frequency/urgency Musculoskeletal ROS: negative for - joint swelling Neurological ROS: as noted in HPI Dermatological ROS: negative for rash and skin lesion changes     Physical exam:  Constitutional: well developed, pleasant female in no apparent distress. Blood pressure 140/58, pulse 61, temperature 98.3 F (36.8 C), temperature source Oral, resp. rate 16, SpO2 96 %.  Eyes: no  jaundice or exophthalmos.  Head: normocephalic. Neck: supple, no bruits, no JVD. Cardiac: no murmurs. Lungs: clear. Abdomen: soft, no tender, no mass. Extremities: no edema, clubbing, or cyanosis.  Skin: no rash  Neurologic Examination:                                                                                                      General: NAD Mental Status: Alert, oriented, thought content appropriate.  Speech fluent without evidence of aphasia.  Able to follow 3 step commands without difficulty. Cranial Nerves: II: Discs flat bilaterally; Visual fields grossly normal, pupils equal, round, reactive to light and accommodation III,IV, VI: ptosis not present, extra-ocular motions intact bilaterally V,VII: smile symmetric, facial light touch sensation normal bilaterally VIII: hearing normal bilaterally IX,X: uvula rises symmetrically XI: bilateral shoulder shrug XII: midline tongue extension without atrophy or fasciculations Motor: Significant for mild right arm weakness Tone and bulk:normal tone throughout; no atrophy noted Sensory: Pinprick and light touch  intact throughout, bilaterally Deep Tendon Reflexes:  1+ all over Plantars: Right: downgoing   Left: downgoing Cerebellar: normal finger-to-nose,  normal heel-to-shin test Gait:  No tested due to multiple leads    Results for orders placed or performed during the hospital encounter of 05/08/15 (from the past 48 hour(s))  Ethanol     Status: None   Collection Time: 05/08/15  8:11 PM  Result Value Ref Range   Alcohol, Ethyl (B) <5 <5 mg/dL    Comment:        LOWEST DETECTABLE LIMIT FOR SERUM ALCOHOL IS 5 mg/dL FOR MEDICAL PURPOSES ONLY   Protime-INR     Status: None   Collection Time: 05/08/15  8:12 PM  Result Value Ref Range   Prothrombin Time 13.9 11.6 - 15.2 seconds   INR 1.05 0.00 - 1.49  APTT     Status: None   Collection Time: 05/08/15  8:12 PM  Result Value Ref Range   aPTT 28 24 - 37 seconds  CBC      Status: None   Collection Time: 05/08/15  8:12 PM  Result Value Ref Range   WBC 10.2 4.0 - 10.5 K/uL   RBC 4.21 3.87 - 5.11 MIL/uL   Hemoglobin 13.9 12.0 - 15.0 g/dL   HCT 40.4 36.0 - 46.0 %   MCV 96.0 78.0 - 100.0 fL   MCH 33.0 26.0 - 34.0 pg   MCHC 34.4 30.0 - 36.0 g/dL   RDW 12.6 11.5 - 15.5 %   Platelets 180 150 - 400 K/uL  Differential     Status: None   Collection Time: 05/08/15  8:12 PM  Result Value Ref Range   Neutrophils Relative % 74 %   Neutro Abs 7.6 1.7 - 7.7 K/uL   Lymphocytes Relative 18 %   Lymphs Abs 1.9 0.7 - 4.0 K/uL   Monocytes Relative 6 %   Monocytes Absolute 0.6 0.1 - 1.0 K/uL   Eosinophils Relative 2 %   Eosinophils Absolute 0.2 0.0 - 0.7 K/uL   Basophils Relative 0 %   Basophils Absolute 0.0 0.0 - 0.1 K/uL  Comprehensive metabolic panel     Status: Abnormal   Collection Time: 05/08/15  8:12 PM  Result Value Ref Range   Sodium 143 135 - 145 mmol/L   Potassium 3.8 3.5 - 5.1 mmol/L   Chloride 109 101 - 111 mmol/L   CO2 24 22 - 32 mmol/L   Glucose, Bld 110 (H) 65 - 99 mg/dL   BUN 15 6 - 20 mg/dL   Creatinine, Ser 0.64 0.44 - 1.00 mg/dL   Calcium 9.4 8.9 - 10.3 mg/dL   Total Protein 5.9 (L) 6.5 - 8.1 g/dL   Albumin 3.7 3.5 - 5.0 g/dL   AST 29 15 - 41 U/L   ALT 20 14 - 54 U/L   Alkaline Phosphatase 70 38 - 126 U/L   Total Bilirubin 0.6 0.3 - 1.2 mg/dL   GFR calc non Af Amer >60 >60 mL/min   GFR calc Af Amer >60 >60 mL/min    Comment: (NOTE) The eGFR has been calculated using the CKD EPI equation. This calculation has not been validated in all clinical situations. eGFR's persistently <60 mL/min signify possible Chronic Kidney Disease.    Anion gap 10 5 - 15  I-stat troponin, ED (not at [facility])     Status: None   Collection Time: 05/08/15  8:15 PM  Result Value Ref Range   Troponin i, poc 0.00  0.00 - 0.08 ng/mL   Comment 3            Comment: Due to the release kinetics of cTnI, a negative result within the first hours of the onset of  symptoms does not rule out myocardial infarction with certainty. If myocardial infarction is still suspected, repeat the test at appropriate intervals.    Ct Head Wo Contrast  05/08/2015  CLINICAL DATA:  Right-sided weakness and altered mental status. History of old infarct. EXAM: CT HEAD WITHOUT CONTRAST TECHNIQUE: Contiguous axial images were obtained from the base of the skull through the vertex without intravenous contrast. COMPARISON:  Brain MRI, 12/12/2013.  Head CT, 06/20/2011. FINDINGS: Ventricles are normal configuration. There is ex vacuo dilation of the posterior left lateral ventricle due to an old left sided infarct that involves the majority of the left parietal lobe posterior left temporal lobe and portions of the left occipital lobe. This is stable from the prior exams. There is no hydrocephalus. There are no parenchymal masses or mass effect. There is no evidence of a recent cortical infarct. Other patchy areas of white matter hypoattenuation are noted consistent with moderate chronic microvascular ischemic change, also stable. There are no extra-axial masses or abnormal fluid collections. There is no intracranial hemorrhage. Visualized sinuses and mastoid air cells are clear. IMPRESSION: 1. No acute intracranial abnormalities. 2. Old left posterior MCA distribution infarct. 3. Moderate chronic microvascular ischemic change. 4. Stable appearance from the prior head CT and brain MRI. Electronically Signed   By: Lajean Manes M.D.   On: 05/08/2015 20:23     Assessment: 75 y.o. female brought in by EMS after experiencing 2 falls at home with reported worsening right sided weakness and inability to walk. Neuro-exam significant only for mild right arm weakness that is old. Patient said that she feels that her right side is still weaker than usual and can not walk (I did not test her gait). CT brain negative. ? Recurrent left brain infarct vs TIA. Admit for TIA/stroke work up. Stroke team  will follow up tomorrow.  Stroke Risk Factors - age, HTN, HLD, CAD, stroke  Plan: 1. HgbA1c, fasting lipid panel 2. MRI, MRA  of the brain without contrast 3. Echocardiogram 4. Carotid dopplers 5. Prophylactic therapy-plavix pending stroke work up 6. Risk factor modification 7. Telemetry monitoring 8. Frequent neuro checks 9. PT/OT SLP   Dorian Pod, MD [facility] [phone]  05/08/2015, 10:26 PM

## 2015-05-08 NOTE — ED Notes (Signed)
Code stroke cancelled 

## 2015-05-08 NOTE — ED Notes (Signed)
Patient transported to CT 

## 2015-05-09 ENCOUNTER — Other Ambulatory Visit (HOSPITAL_COMMUNITY): Payer: Self-pay

## 2015-05-09 ENCOUNTER — Observation Stay (HOSPITAL_BASED_OUTPATIENT_CLINIC_OR_DEPARTMENT_OTHER): Payer: Medicare Other

## 2015-05-09 DIAGNOSIS — I6789 Other cerebrovascular disease: Secondary | ICD-10-CM | POA: Diagnosis not present

## 2015-05-09 DIAGNOSIS — N390 Urinary tract infection, site not specified: Secondary | ICD-10-CM | POA: Diagnosis not present

## 2015-05-09 DIAGNOSIS — G459 Transient cerebral ischemic attack, unspecified: Secondary | ICD-10-CM | POA: Diagnosis not present

## 2015-05-09 DIAGNOSIS — I6300 Cerebral infarction due to thrombosis of unspecified precerebral artery: Secondary | ICD-10-CM

## 2015-05-09 DIAGNOSIS — I639 Cerebral infarction, unspecified: Secondary | ICD-10-CM | POA: Diagnosis not present

## 2015-05-09 DIAGNOSIS — I2510 Atherosclerotic heart disease of native coronary artery without angina pectoris: Secondary | ICD-10-CM | POA: Diagnosis not present

## 2015-05-09 DIAGNOSIS — R531 Weakness: Secondary | ICD-10-CM | POA: Diagnosis not present

## 2015-05-09 DIAGNOSIS — E785 Hyperlipidemia, unspecified: Secondary | ICD-10-CM | POA: Diagnosis not present

## 2015-05-09 LAB — LIPID PANEL
CHOL/HDL RATIO: 2.3 ratio
CHOLESTEROL: 139 mg/dL (ref 0–200)
Cholesterol: 140 mg/dL (ref 0–200)
HDL: 60 mg/dL (ref >40)
HDL: 61 mg/dL (ref >40)
LDL CALC: 66 mg/dL (ref 0–99)
LDL Cholesterol: 66 mg/dL (ref 0–99)
Total CHOL/HDL Ratio: 2.3 RATIO
Triglycerides: 70 mg/dL (ref <150)
Triglycerides: 62 mg/dL (ref <150)
VLDL: 14 mg/dL (ref 0–40)
VLDL: 12 mg/dL (ref 0–40)

## 2015-05-09 LAB — URINALYSIS, ROUTINE W REFLEX MICROSCOPIC
BILIRUBIN URINE: NEGATIVE
GLUCOSE, UA: NEGATIVE mg/dL
Ketones, ur: NEGATIVE mg/dL
NITRITE: POSITIVE — AB
PH: 5 (ref 5.0–8.0)
Protein, ur: NEGATIVE mg/dL
SPECIFIC GRAVITY, URINE: 1.023 (ref 1.005–1.030)

## 2015-05-09 LAB — URINE MICROSCOPIC-ADD ON: RBC / HPF: NONE SEEN RBC/hpf (ref 0–5)

## 2015-05-09 LAB — RAPID URINE DRUG SCREEN, HOSP PERFORMED
Amphetamines: NOT DETECTED
Barbiturates: NOT DETECTED
Benzodiazepines: NOT DETECTED
Cocaine: NOT DETECTED
OPIATES: NOT DETECTED
TETRAHYDROCANNABINOL: NOT DETECTED

## 2015-05-09 LAB — TSH: TSH: 1.845 u[IU]/mL (ref 0.350–4.500)

## 2015-05-09 MED ORDER — CIPROFLOXACIN HCL 500 MG PO TABS
250.0000 mg | ORAL_TABLET | Freq: Two times a day (BID) | ORAL | Status: DC
  Administered 2015-05-09: 250 mg via ORAL
  Filled 2015-05-09: qty 1

## 2015-05-09 MED ORDER — DEXTROSE 5 % IV SOLN
1.0000 g | INTRAVENOUS | Status: DC
  Administered 2015-05-09 – 2015-05-10 (×2): 1 g via INTRAVENOUS
  Filled 2015-05-09 (×2): qty 10

## 2015-05-09 NOTE — Progress Notes (Signed)
STROKE TEAM PROGRESS NOTE   HISTORY AS DOCUMENTED AT THE TIME OF ADMISSION [name] is an 75 y.o. 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 Patient was in her usual state of health until this evening when she went to the bathroom, her right side became " weak and useless" and she fell to the floor. She was able to get up by herself but then had another episode with similar characteristics. She lives by herself but was able to call her daughter on the phone and EMS was summoned. By the time EMS arrived to the scene the right sided weakness had resolved. [name] denies associated HA, vertigo, double vision, difficulty swallowing, slurred speech, language or vision impairment. On plavix. CT brain was personally reviewed and showed no acute abnormality. Reviewed available labs are results are unrevealing.  Date last known well: 05/08/15 Time last known well: 3 pm tPA Given: no, out of the window   SUBJECTIVE (INTERVAL HISTORY) Her daughter is at the bedside.  Overall she feels her condition is gradually improving. She denies complaints to for ROS   OBJECTIVE Temp:  [98.3 F (36.8 C)-98.8 F (37.1 C)] 98.8 F (37.1 C) (01/07 2228) Pulse Rate:  [54-66] 62 (01/08 0800) Cardiac Rhythm:  [-]  Resp:  [14-24] 24 (01/08 0800) BP: (114-150)/(51-95) 126/66 mmHg (01/08 0800) SpO2:  [92 %-99 %] 97 % (01/08 0800)  CBC:   Recent Labs Lab 05/08/15 2012  WBC 10.2  NEUTROABS 7.6  HGB 13.9  HCT 40.4  MCV 96.0  PLT 180    Basic Metabolic Panel:   Recent Labs Lab 05/08/15 2012  NA 143  K 3.8  CL 109  CO2 24  GLUCOSE 110*  BUN 15  CREATININE 0.64  CALCIUM 9.4    Lipid Panel:     Component Value Date/Time   CHOL 139 05/09/2015 0256   TRIG 62 05/09/2015 0256   HDL 61  05/09/2015 0256   CHOLHDL 2.3 05/09/2015 0256   VLDL 12 05/09/2015 0256   LDLCALC 66 05/09/2015 0256   HgbA1c:  Lab Results  Component Value Date   HGBA1C  05/16/2008    6.0 (NOTE)   The ADA recommends the following therapeutic goal for glycemic   control related to Hgb A1C measurement:   Goal of Therapy:   < 7.0% Hgb A1C   Reference: American Diabetes Association: Clinical Practice   Recommendations 2008, Diabetes Care,  2008, 31:(Suppl 1).   Urine Drug Screen:     Component Value Date/Time   LABOPIA NONE DETECTED 05/09/2015 0229   LABOPIA NEGATIVE 05/16/2008 0520   COCAINSCRNUR NONE DETECTED 05/09/2015 0229   COCAINSCRNUR NEGATIVE 05/16/2008 0520   LABBENZ NONE DETECTED 05/09/2015 0229   LABBENZ NEGATIVE 05/16/2008 0520   AMPHETMU NONE DETECTED 05/09/2015 0229   AMPHETMU NEGATIVE 05/16/2008 0520   THCU NONE DETECTED 05/09/2015 0229   LABBARB NONE DETECTED 05/09/2015 0229      IMAGING  Ct Head Wo Contrast 05/08/2015   1. No acute intracranial abnormalities.  2. Old left posterior MCA distribution infarct.  3. Moderate chronic microvascular ischemic change.  4. Stable appearance from the prior head CT and brain MRI.   Mr Maxine GlennMra Head Wo Contrast 05/09/2015    MRI HEAD:  No acute intracranial process, specifically no acute ischemia. Old large  LEFT MCA territory infarct. Moderate to severe chronic small vessel ischemic disease.   MRA HEAD:  No acute large vessel occlusion or high-grade stenosis. Paucity of mid to distal LEFT MCA branches compatible with old infarct. 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   PHYSICAL EXAM Eyes: no jaundice or exophthalmos.  Head: normocephalic. Neck: supple, no bruits, no JVD. Cardiac: no murmurs. Lungs: clear. Abdomen: soft, no tender, no mass. Extremities: no edema, clubbing, or cyanosis.  Skin: no rash  Neurologic  Examination:   General: NAD  Mental Status: Alert, oriented, thought content appropriate. Speech fluent without evidence of aphasia. Able to follow 3 step commands without difficulty.  Cranial Nerves: II: Visual fields grossly normal, pupils equal, round, reactive to light and accommodation III,IV, VI: ptosis not present, extra-ocular motions intact bilaterally V,VII: smile symmetric, facial light touch sensation normal bilaterally VIII: hearing normal bilaterally IX,X: uvula rises symmetrically XI: bilateral shoulder shrug XII: midline tongue extension without atrophy or fasciculations  Motor: Significant for mild right arm weakness Tone and bulk:normal tone throughout; no atrophy noted  Sensory: Pinprick and light touch intact throughout, bilaterally  Deep Tendon Reflexes:  1+ all over  Plantars: Right: downgoingLeft: downgoing  Cerebellar: normal finger-to-nose, normal heel-to-shin test Gait:  No tested due to multiple leads   ASSESSMENT/PLAN [name] is a 75 y.o. female with history of hypertension, hyperlipidemia, coronary artery disease with previous MI and stenting in 2010, and previous stroke with residual mild right hemiparesis presenting with falls x 2 with increased right sided weakness and inablity to walk.. She did not receive IV t-PA due to late presentation.  Possible worsening of chronic stroke symptoms:  Dominant   Resultant  Right sided weakness  MRI - No acute intracranial process, specifically no acute ischemia.  MRA - No acute large vessel occlusion or high-grade stenosis. 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  Carotid Doppler - 1-39% ICA plaquing. Vertebral artery flow is antegrade.   2D Echo pending  LDL 66  HgbA1c pending  VTE prophylaxis - Lovenox Diet NPO time  specified  clopidogrel 75 mg daily prior to admission, now on clopidogrel 75 mg daily  Patient counseled to be compliant with her antithrombotic medications  Ongoing aggressive stroke risk factor management  Therapy recommendations: Pending  Disposition: Pending  Hypertension  Stable  Hyperlipidemia  Home meds:  Lipitor 40 mg daily resumed in hospital  LDL 66, goal < 70  Continue statin at discharge  Other Stroke Risk Factors  Advanced age  Cigarette smoker, quit smoking 7 years ago.  Obesity, There is no weight on file to calculate BMI.   Hx stroke/TIA  Coronary artery disease  Other Active Problems  UTI-  Cipro started  Hospital day #   ATTENDING NOTE: Patient was seen and examined by me personally. Documentation reflects findings. The laboratory and radiographic studies reviewed by me. ROS completed by me personally and pertinent positives fully documented  Condition: stable  Assessment and plan completed by me personally and fully documented above. Plans/Recommendations include:     Spoke with daughter at bedside and updated.  MRI reveals no acute ischemic change; patient however, does have large left MCA stroke  Urine +.  Symptoms may well be related to UTI in setting of chronic stroke with resultant right sided findings.  We started Cipro.  No acute stroke.  Stroke team plans to sign off at this time.  If  questions arise related to ECHO or HgbA1C please feel free to call  SIGNED BY: Dr. Sula Soda      To contact Stroke Continuity provider, please refer to [email or web address]. After hours, contact General Neurology

## 2015-05-09 NOTE — Evaluation (Signed)
Physical Therapy Evaluation Patient Details Name: [name] MRN: [id number] DOB: 03-29-1941 Today's Date: 05/09/2015   History of Present Illness  Patient is a 75 yo female admitted 05/08/15 after fall x2.  Patient with Rt-sided numbness that resolved, and confusion.  MRI showed no acute infarct.  Patient also with UTI.  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  Will benefit from acute PT to maximize functional mobility prior to discharge.  Patient was independent pta with mobility, gait, and ADL's.  Today required mod assist with mobility, and noted cognitive deficits.  Recommend ST-SNF at discharge for continued therapy.  Noted Rt knee pain/edema.  Patient unable to put Rt foot on floor to bear weight due to pain.  Informed RN - to contact MD.    Follow Up Recommendations SNF;Supervision for mobility/OOB    Equipment Recommendations  Rolling walker with 5" wheels    Recommendations for Other Services       Precautions / Restrictions Precautions Precautions: Fall Precaution Comments: Falls x2 pta. Restrictions Weight Bearing Restrictions: No      Mobility  Bed Mobility Overal bed mobility: Needs Assistance Bed Mobility: Rolling;Supine to Sit;Sit to Supine Rolling: Min assist   Supine to sit: Mod assist;Max assist Sit to supine: Mod assist   General bed mobility comments: Verbal and tactile cues for technique.  Patient with difficulty following commands, requiring multiple cues, increased time, and physical assist to complete tasks.  Once sitting EOB, patient unable to flex Rt knee to 90* to sit comfortably.  Patient reported increased pain, and began to return to supine.  Transfers                 General transfer comment: Unable to attempt due to pain/edema in Rt knee.  Ambulation/Gait                Stairs            Wheelchair Mobility     Modified Rankin (Stroke Patients Only) Modified Rankin (Stroke Patients Only) Pre-Morbid Rankin Score: No significant disability Modified Rankin: Severe disability     Balance Overall balance assessment: Needs assistance Sitting-balance support: Single extremity supported;Feet unsupported Sitting balance-Leahy Scale: Fair Sitting balance - Comments: UE support primarily due to knee pain impacting balance.                                     Pertinent Vitals/Pain Pain Assessment: Faces Faces Pain Scale: Hurts whole lot Pain Location: Rt knee - edema noted Pain Descriptors / Indicators: Grimacing;Guarding;Tender Pain Intervention(s): Limited activity within patient's tolerance;Monitored during session;Other (comment) (Notified RN of knee pain and edema.  RN to contact MD)    Home Living Family/patient expects to be discharged to:: Private residence Living Arrangements: Children (Son with cerebral palsy lives with patient) Available Help at Discharge: Family;Available PRN/intermittently Type of Home: House Home Access: Stairs to enter Entrance Stairs-Rails: Right Entrance Stairs-Number of Steps: 3 Home Layout: Two level;Able to live on main level with bedroom/bathroom Home Equipment: None Additional Comments: Patient assists with care of son with cerebral palsy.  Son has CAP services and attends a day program at times.    Prior Function Level of Independence: Independent         Comments: Does not drive.  Communication/speech issue from prior CVA.  Hand Dominance   Dominant Hand: Left    Extremity/Trunk Assessment   Upper Extremity Assessment: Defer to OT evaluation           Lower Extremity Assessment: RLE deficits/detail RLE Deficits / Details: Decreased strength and ROM, possibly related to prior CVA and/or injury during fall.  Noted knee painful and swollen.  Unable to flex knee to 90* to sit EOB.       Communication   Communication:  Expressive difficulties;Receptive difficulties  Cognition Arousal/Alertness: Awake/alert Behavior During Therapy: Impulsive;Restless Overall Cognitive Status: Impaired/Different from baseline Area of Impairment: Orientation;Memory;Following commands;Safety/judgement;Problem solving Orientation Level: Disoriented to;Place;Time (Difficult to assess due to communication deficits)   Memory: Decreased short-term memory Following Commands: Follows one step commands inconsistently;Follows one step commands with increased time Safety/Judgement: Decreased awareness of safety   Problem Solving: Slow processing;Difficulty sequencing;Requires verbal cues;Requires tactile cues General Comments: Patient with speech deficits making cognitive assessment difficult.  Patient impulsive, pulling gown up to chest.  Difficulty processing commands - slow.  Asked patient to sit at edge of bed.  Patient lifted leg off of bed and began moving trunk to opposite side.  Required repeated verbal and tactile cues to complete task.  Patient having difficulty describing fall at home, with multiple versions (fell in bathroom/living room/outside).    General Comments      Exercises        Assessment/Plan    PT Assessment Patient needs continued PT services  PT Diagnosis Difficulty walking;Generalized weakness;Acute pain;Altered mental status   PT Problem List Decreased strength;Decreased range of motion;Decreased activity tolerance;Decreased balance;Decreased mobility;Decreased cognition;Decreased safety awareness;Pain  PT Treatment Interventions DME instruction;Gait training;Functional mobility training;Therapeutic activities;Therapeutic exercise;Balance training;Cognitive remediation;Patient/family education   PT Goals (Current goals can be found in the Care Plan section) Acute Rehab PT Goals Patient Stated Goal: Unable to state.  Daughter:  to evaluate Rt knee PT Goal Formulation: With patient/family Time For Goal  Achievement: 05/16/15 Potential to Achieve Goals: Good    Frequency Min 3X/week   Barriers to discharge Decreased caregiver support Patient assists with care for her son with cerebral palsy.    Co-evaluation               End of Session   Activity Tolerance: Patient limited by pain Patient left: in bed;with call bell/phone within reach;with bed alarm set;with family/visitor present Nurse Communication: Mobility status (Rt knee pain/edema)    Functional Assessment Tool Used: Clinical judgement Functional Limitation: Mobility: Walking and moving around Mobility: Walking and Moving Around Current Status ([id number](G8978): At least 60 percent but less than 80 percent impaired, limited or restricted Mobility: Walking and Moving Around Goal Status [phone](G8979): At least 20 percent but less than 40 percent impaired, limited or restricted    Time: [id number] PT Time Calculation (min) (ACUTE ONLY): 45 min   Charges:   PT Evaluation $PT Eval Moderate Complexity: 1 Procedure PT Treatments $Therapeutic Activity: 8-22 mins   PT G Codes:   PT G-Codes **NOT FOR INPATIENT CLASS** Functional Assessment Tool Used: Clinical judgement Functional Limitation: Mobility: Walking and moving around Mobility: Walking and Moving Around Current Status ([id number](G8978): At least 60 percent but less than 80 percent impaired, limited or restricted Mobility: Walking and Moving Around Goal Status [phone](G8979): At least 20 percent but less than 40 percent impaired, limited or restricted    Vena AustriaDavis, Levin Dagostino H 05/09/2015, 6:14 PM Durenda HurtSusan H. Renaldo Fiddleravis, PT, [facility] Acute Rehab Services Pager [phone]

## 2015-05-09 NOTE — Progress Notes (Signed)
*  PRELIMINARY RESULTS* Echocardiogram 2D Echocardiogram has been performed.  Jeryl Columbialliott, Lamarius Dirr 05/09/2015, 9:54 AM

## 2015-05-09 NOTE — Progress Notes (Signed)
VASCULAR LAB PRELIMINARY  PRELIMINARY  PRELIMINARY  PRELIMINARY  Carotid duplex completed.    Preliminary report:  1-39% ICA plaquing.  Vertebral artery flow is antegrade.   Tuere Nwosu, RVT 05/09/2015, 9:13 AM

## 2015-05-09 NOTE — Progress Notes (Signed)
Patient arrived to 5M07 at  At current time.Vital signs taken and charted. Telemetry applied and CCMD notified. Diet ordered. Family at bedside.

## 2015-05-09 NOTE — Progress Notes (Signed)
Patient Demographics:    [name], is a 75 y.o. female, DOB - 01/04/1941, WUJ:[id number]  Admit date - 05/08/2015   Admitting Physician No admitting provider for patient encounter.  Outpatient Primary MD for the patient is Leanor RubensteinSUN,VYVYAN Y, MD  LOS -    Chief Complaint  Patient presents with  . Weakness        Subjective:    Stacey BruntBetty [name] today has, No headache, No chest pain, No abdominal pain - No Nausea, No new weakness tingling or numbness, No Cough - SOB.     Assessment  & Plan :    Principal Problem:   TIA (transient ischemic attack) Active Problems:   Hypertension   Hyperlipidemia   CVA (cerebrovascular accident) (HCC)   Coronary artery disease   Weakness   1. Worsening of chronic right-sided hemiparesis. Likely brought on by UTI. Seen by neurology, MRI nonacute, cleared by neurology from stroke standpoint. She is currently on Plavix and statin for secondary prevention for her previous left MCA territory stroke which will be continued. PT eval. Echogram and carotid duplex have been ordered upon admission we will completed.    2. UTI. Place on empiric antibiotics and monitor.   3. Essential hypertension. Continue home dose  Lisinopril.   4. Mild bradycardia at rest. Hold home dose better blocker and check TSH.   5. Mild dementia. Continue Aricept. At risk for delirium.    Code Status : Full  Family Communication  : Daughter bedside  Disposition Plan  : Home in 1-2 days  Consults  :  Neurology  Procedures  :   MRI-MRA brain. Nonacute. Chronic intracranial aneurysm.  TTE, carotid duplex  DVT Prophylaxis  :  Lovenox   Lab Results  Component Value Date   PLT 180 05/08/2015    Inpatient Medications  Scheduled Meds: .  stroke: mapping our early stages of recovery  book   Does not apply Once  . atorvastatin  40 mg Oral q1800  . ciprofloxacin  250 mg Oral BID  . clopidogrel  75 mg Oral Daily  . donepezil  10 mg Oral QHS  . enoxaparin (LOVENOX) injection  40 mg Subcutaneous Daily  . lisinopril  10 mg Oral Daily  . metoprolol tartrate  25 mg Oral BID   Continuous Infusions:  PRN Meds:.nitroGLYCERIN, senna-docusate  Antibiotics  :     Anti-infectives    Start     Dose/Rate Route Frequency Ordered Stop   05/09/15 1030  ciprofloxacin (CIPRO) tablet 250 mg     250 mg Oral 2 times daily 05/09/15 1015 05/12/15 0759        Objective:   Filed Vitals:   05/09/15 0600 05/09/15 0800 05/09/15 0948 05/09/15 1000  BP: 134/56 126/66 108/72 120/62  Pulse: 56 62 63 64  Temp:      TempSrc:      Resp: 16 24 14 16   SpO2: 96% 97% 95% 95%    Wt Readings from Last 3 Encounters:  03/12/14 60.782 kg (134 lb)  03/09/14 61.961 kg (136 lb 9.6 oz)  11/05/13 64.683 kg (142 lb 9.6 oz)     Intake/Output Summary (Last 24 hours) at 05/09/15 1246 Last data filed at 05/09/15 0233  Gross per 24 hour  Intake      0 ml  Output    250 ml  Net   -250 ml     Physical Exam  Awake, some expressive and receptive aphasia, No new F.N deficits, chronic right-sided weakness right leg more than right arm, Normal affect [place].AT,PERRAL Supple Neck,No JVD, No cervical lymphadenopathy appriciated.  Symmetrical Chest wall movement, Good air movement bilaterally, CTAB RRR,No Gallops,Rubs or new Murmurs, No Parasternal Heave +ve B.Sounds, Abd Soft, No tenderness, No organomegaly appriciated, No rebound - guarding or rigidity. No Cyanosis, Clubbing or edema, No new Rash or bruise       Data Review:   Micro Results No results found for this or any previous visit (from the past 240 hour(s)).  Radiology Reports Ct Head Wo Contrast  05/08/2015  CLINICAL DATA:  Right-sided weakness and altered mental status. History of old infarct. EXAM: CT HEAD WITHOUT CONTRAST TECHNIQUE:  Contiguous axial images were obtained from the base of the skull through the vertex without intravenous contrast. COMPARISON:  Brain MRI, 12/12/2013.  Head CT, 06/20/2011. FINDINGS: Ventricles are normal configuration. There is ex vacuo dilation of the posterior left lateral ventricle due to an old left sided infarct that involves the majority of the left parietal lobe posterior left temporal lobe and portions of the left occipital lobe. This is stable from the prior exams. There is no hydrocephalus. There are no parenchymal masses or mass effect. There is no evidence of a recent cortical infarct. Other patchy areas of white matter hypoattenuation are noted consistent with moderate chronic microvascular ischemic change, also stable. There are no extra-axial masses or abnormal fluid collections. There is no intracranial hemorrhage. Visualized sinuses and mastoid air cells are clear. IMPRESSION: 1. No acute intracranial abnormalities. 2. Old left posterior MCA distribution infarct. 3. Moderate chronic microvascular ischemic change. 4. Stable appearance from the prior head CT and brain MRI. Electronically Signed   By: Amie Portland M.D.   On: 05/08/2015 20:23   Mr Maxine Glenn Head Wo Contrast  05/09/2015  CLINICAL DATA:  Larey Seat twice, worsening RIGHT-sided weakness. Unable to walk. History of hypertension, hyperlipidemia, stroke 2010 with residual mild RIGHT hemi paresis. EXAM: MRI HEAD WITHOUT CONTRAST MRA HEAD WITHOUT CONTRAST TECHNIQUE: Multiplanar, multiecho pulse sequences of the brain and surrounding structures were obtained without intravenous contrast. Angiographic images of the head were obtained using MRA technique without contrast. COMPARISON:  CT head May 07, 2014 at 2016 hours and MRI of the brain December 12, 2013 and MRA head December 01, 2009 FINDINGS: MRI HEAD FINDINGS No reduced diffusion to suggest acute ischemia. LEFT temporal parietal occipital encephalomalacia with faint susceptibility artifact compatible  with mineralization. Nonspecific punctate focus of susceptibility artifact RIGHT frontal lobe. Ex vacuo dilatation of LEFT occipital horn, ventricles and sulci are otherwise normal for patient's age. Patchy to confluent supratentorial white matter T2 hyperintensities stable from prior MRI. No midline shift, mass effect or mass lesions. Prominent perivascular spaces are unchanged. No abnormal extra-axial fluid collections. Ocular globes and orbital contents are normal. Trace paranasal sinus mucosal thickening without air-fluid levels. The mastoid air cells are well aerated. No abnormal sellar expansion. No cerebellar tonsillar ectopia. No suspicious calvarial bone marrow signal. Patient appears edentulous. MRA HEAD FINDINGS Anterior circulation: Normal flow related enhancement of the included cervical, petrous, cavernous and supraclinoid internal carotid arteries. 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 Stable appearance of 5 mm superiorly directed LEFT para ophthalmic aneurysm. Patent anterior communicating artery. Normal flow  related enhancement of the anterior cerebral arteries, including distal segments. Paucity of mid to distal LEFT MCA branches compatible with old infarct. No large vessel occlusion, high-grade stenosis, abnormal luminal irregularity. Posterior circulation: RIGHT vertebral artery is dominant. Basilar artery is patent, with normal flow related enhancement of the main branch vessels. Normal flow related enhancement of the posterior cerebral arteries. Fetal origin RIGHT posterior cerebral artery. No large vessel occlusion, high-grade stenosis, abnormal luminal irregularity, aneurysm. IMPRESSION: MRI HEAD: No acute intracranial process, specifically no acute ischemia. Old large LEFT MCA territory infarct. Moderate to severe chronic small vessel ischemic disease. MRA HEAD: No acute large vessel occlusion or high-grade stenosis. Paucity of mid to  distal LEFT MCA branches compatible with old infarct. 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 Electronically Signed   By: Awilda Metro M.D.   On: 05/09/2015 01:02   Mr Brain Wo Contrast  05/09/2015  CLINICAL DATA:  Larey Seat twice, worsening RIGHT-sided weakness. Unable to walk. History of hypertension, hyperlipidemia, stroke 2010 with residual mild RIGHT hemi paresis. EXAM: MRI HEAD WITHOUT CONTRAST MRA HEAD WITHOUT CONTRAST TECHNIQUE: Multiplanar, multiecho pulse sequences of the brain and surrounding structures were obtained without intravenous contrast. Angiographic images of the head were obtained using MRA technique without contrast. COMPARISON:  CT head May 07, 2014 at 2016 hours and MRI of the brain December 12, 2013 and MRA head December 01, 2009 FINDINGS: MRI HEAD FINDINGS No reduced diffusion to suggest acute ischemia. LEFT temporal parietal occipital encephalomalacia with faint susceptibility artifact compatible with mineralization. Nonspecific punctate focus of susceptibility artifact RIGHT frontal lobe. Ex vacuo dilatation of LEFT occipital horn, ventricles and sulci are otherwise normal for patient's age. Patchy to confluent supratentorial white matter T2 hyperintensities stable from prior MRI. No midline shift, mass effect or mass lesions. Prominent perivascular spaces are unchanged. No abnormal extra-axial fluid collections. Ocular globes and orbital contents are normal. Trace paranasal sinus mucosal thickening without air-fluid levels. The mastoid air cells are well aerated. No abnormal sellar expansion. No cerebellar tonsillar ectopia. No suspicious calvarial bone marrow signal. Patient appears edentulous. MRA HEAD FINDINGS Anterior circulation: Normal flow related enhancement of the included cervical, petrous, cavernous and supraclinoid internal carotid arteries. Stable appearance of the inferiorly directed 6 mm wide necked RIGHT para  ophthalmic aneurysm. Stable appearance of 5 mm superiorly directed LEFT para ophthalmic aneurysm. Patent anterior communicating artery. Normal flow related enhancement of the anterior cerebral arteries, including distal segments. Paucity of mid to distal LEFT MCA branches compatible with old infarct. No large vessel occlusion, high-grade stenosis, abnormal luminal irregularity. Posterior circulation: RIGHT vertebral artery is dominant. Basilar artery is patent, with normal flow related enhancement of the main branch vessels. Normal flow related enhancement of the posterior cerebral arteries. Fetal origin RIGHT posterior cerebral artery. No large vessel occlusion, high-grade stenosis, abnormal luminal irregularity, aneurysm. IMPRESSION: MRI HEAD: No acute intracranial process, specifically no acute ischemia. Old large LEFT MCA territory infarct. Moderate to severe chronic small vessel ischemic disease. MRA HEAD: No acute large vessel occlusion or high-grade stenosis. Paucity of mid to distal LEFT MCA branches compatible with old infarct. 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 Electronically Signed   By: Awilda Metro M.D.   On: 05/09/2015 01:02     CBC  Recent Labs Lab 05/08/15 2012  WBC 10.2  HGB 13.9  HCT 40.4  PLT 180  MCV 96.0  MCH 33.0  MCHC 34.4  RDW 12.6  LYMPHSABS 1.9  MONOABS 0.6  EOSABS 0.2  BASOSABS 0.0    Chemistries   Recent Labs Lab 05/08/15 2012  NA 143  K 3.8  CL 109  CO2 24  GLUCOSE 110*  BUN 15  CREATININE 0.64  CALCIUM 9.4  AST 29  ALT 20  ALKPHOS 70  BILITOT 0.6   ------------------------------------------------------------------------------------------------------------------ CrCl cannot be calculated (Unknown ideal weight.). ------------------------------------------------------------------------------------------------------------------ No results for input(s): HGBA1C in the last 72  hours. ------------------------------------------------------------------------------------------------------------------  Recent Labs  05/09/15 0054 05/09/15 0256  CHOL 140 139  HDL 60 61  LDLCALC 66 66  TRIG 70 62  CHOLHDL 2.3 2.3   ------------------------------------------------------------------------------------------------------------------ No results for input(s): TSH, T4TOTAL, T3FREE, THYROIDAB in the last 72 hours.  Invalid input(s): FREET3 ------------------------------------------------------------------------------------------------------------------ No results for input(s): VITAMINB12, FOLATE, FERRITIN, TIBC, IRON, RETICCTPCT in the last 72 hours.  Coagulation profile  Recent Labs Lab 05/08/15 2012  INR 1.05    No results for input(s): DDIMER in the last 72 hours.  Cardiac Enzymes No results for input(s): CKMB, TROPONINI, MYOGLOBIN in the last 168 hours.  Invalid input(s): CK ------------------------------------------------------------------------------------------------------------------ Invalid input(s): POCBNP   Time Spent in minutes  35   Vaughan Garfinkle K M.D on 05/09/2015 at 12:46 PM  Between 7am to 7pm - Pager - [phone]  After 7pm go to [email or web address] - password [facility]  [facility] -  Office  [phone]

## 2015-05-10 ENCOUNTER — Observation Stay (HOSPITAL_COMMUNITY): Payer: Medicare Other

## 2015-05-10 ENCOUNTER — Other Ambulatory Visit: Payer: Self-pay | Admitting: Cardiovascular Disease

## 2015-05-10 DIAGNOSIS — R531 Weakness: Secondary | ICD-10-CM

## 2015-05-10 DIAGNOSIS — I639 Cerebral infarction, unspecified: Secondary | ICD-10-CM | POA: Insufficient documentation

## 2015-05-10 DIAGNOSIS — E785 Hyperlipidemia, unspecified: Secondary | ICD-10-CM | POA: Diagnosis not present

## 2015-05-10 DIAGNOSIS — I6300 Cerebral infarction due to thrombosis of unspecified precerebral artery: Secondary | ICD-10-CM | POA: Diagnosis not present

## 2015-05-10 LAB — GLUCOSE, CAPILLARY
Glucose-Capillary: 104 mg/dL — ABNORMAL HIGH (ref 65–99)
Glucose-Capillary: 83 mg/dL (ref 65–99)

## 2015-05-10 LAB — HEMOGLOBIN A1C
HEMOGLOBIN A1C: 6 % — AB (ref 4.8–5.6)
HEMOGLOBIN A1C: 6 % — AB (ref 4.8–5.6)
MEAN PLASMA GLUCOSE: 126 mg/dL
Mean Plasma Glucose: 126 mg/dL

## 2015-05-10 MED ORDER — CEFPODOXIME PROXETIL 200 MG PO TABS: 200.0000 mg | ORAL_TABLET | Freq: Two times a day (BID) | ORAL | Status: DC

## 2015-05-10 NOTE — Clinical Social Work Placement (Signed)
   CLINICAL SOCIAL WORK PLACEMENT  NOTE  Date:  05/10/2015  Patient Details  Name: Rande BruntBetty Horkey MRN: [id number] Date of Birth: 08/13/1940  Clinical Social Work is seeking post-discharge placement for this patient at the Skilled  Nursing Facility level of care (*CSW will initial, date and re-position this form in  chart as items are completed):  Yes   Patient/family provided with [facility] Clinical Social Work Department's list of facilities offering this level of care within the geographic area requested by the patient (or if unable, by the patient's family).  Yes   Patient/family informed of their freedom to choose among providers that offer the needed level of care, that participate in Medicare, Medicaid or managed care program needed by the patient, have an available bed and are willing to accept the patient.  Yes   Patient/family informed of [facility]'s ownership interest [facility] and [facility], as well as of the fact that they are under no obligation to receive care at these facilities.  PASRR submitted to EDS on 05/10/15     PASRR number received on 05/10/15     Existing PASRR number confirmed on       FL2 transmitted to all facilities in geographic area requested by pt/family on 05/10/15     FL2 transmitted to all facilities within larger geographic area on       Patient informed that his/her managed care company has contracts with or will negotiate with certain facilities, including the following:            Patient/family informed of bed offers received.  Patient chooses bed at       Physician recommends and patient chooses bed at      Patient to be transferred to   on  .  Patient to be transferred to facility by       Patient family notified on   of transfer.  Name of family member notified:        PHYSICIAN Please sign FL2     Additional Comment:    _______________________________________________ Vaughan BrownerNixon, Kaniesha Barile A, LCSW 05/10/2015, 12:02  PM

## 2015-05-10 NOTE — Progress Notes (Signed)
Physical Therapy Treatment Patient Details Name: [name] MRN: [id number] DOB: [birth year] Today's Date: 05/10/2015    History of Present Illness Patient is a 75 yo female admitted 05/08/15 after fall x2.  Patient with Rt-sided numbness that resolved, and confusion.  MRI showed no acute infarct.  Patient also with UTI.  PMH:  CVA in 2010 with mild Rt hemi and speech deficits; CAD, HTN, HLD, dementia    PT Comments    Noting progress with transfers and mobility; Pt was able to indicate that the plan is for her to go to a "home", and she did indicate she hopes to get better; she agreed to working hard with rehab when I asked her to  Follow Up Recommendations  SNF;Supervision for mobility/OOB     Equipment Recommendations  Rolling walker with 5" wheels    Recommendations for Other Services       Precautions / Restrictions Precautions Precautions: Fall Precaution Comments: Falls x2 pta. Restrictions Weight Bearing Restrictions: No Other Position/Activity Restrictions: Pt with pain in RLE    Mobility  Bed Mobility Overal bed mobility: Needs Assistance Bed Mobility: Sit to Supine       Sit to supine: Min assist   General bed mobility comments: Pt was able to bed L leg and assist therapist to move up in bed.  Pointed in direction pt needed to move and she did very well.  Does not do as well with verbal commands.  Transfers Overall transfer level: Needs assistance Equipment used: Rolling walker (2 wheeled) Transfers: Sit to/from Stand Sit to Stand: Mod assist;+2 safety/equipment         General transfer comment: Noting less weight bearing RLE, but no notable increased pain or anxiety on her feet  Ambulation/Gait Ambulation/Gait assistance: +2 safety/equipment;Min assist Ambulation Distance (Feet):  (small pivot steps recliner back to bed) Assistive device: Rolling walker (2 wheeled) Gait Pattern/deviations: Shuffle     General Gait Details: Small steps with tactile  cues for turning direction   Stairs            Wheelchair Mobility    Modified Rankin (Stroke Patients Only) Modified Rankin (Stroke Patients Only) Pre-Morbid Rankin Score: No significant disability Modified Rankin: Severe disability     Balance     Sitting balance-Leahy Scale: Fair       Standing balance-Leahy Scale: Poor                      Cognition Arousal/Alertness: Awake/alert Behavior During Therapy: Impulsive Overall Cognitive Status: No family/caregiver present to determine baseline cognitive functioning Area of Impairment: Orientation;Memory;Following commands;Safety/judgement;Problem solving;Attention Orientation Level: Disoriented to;Place;Time;Situation Current Attention Level: Sustained Memory: Decreased short-term memory Following Commands: Follows one step commands inconsistently;Follows one step commands with increased time Safety/Judgement: Decreased awareness of safety   Problem Solving: Slow processing;Difficulty sequencing;Requires verbal cues;Requires tactile cues      Exercises      General Comments General comments (skin integrity, edema, etc.): Still worth considering imaging her R knee      Pertinent Vitals/Pain Pain Assessment: Faces Faces Pain Scale: Hurts little more Pain Location: R knee; pt with unorganized stream of consciousness speech, but at one time it seemed she was trying to describe how she hurt her knee -- still quite incoherent Pain Descriptors / Indicators: Grimacing (and less weight on RLE in standing compared to L) Pain Intervention(s): Limited activity within patient's tolerance;Monitored during session    Home Living  Prior Function            PT Goals (current goals can now be found in the care plan section) Acute Rehab PT Goals Patient Stated Goal: unable to state PT Goal Formulation: With patient/family Time For Goal Achievement: 05/16/15 Potential to Achieve  Goals: Good Progress towards PT goals: Progressing toward goals    Frequency  Min 3X/week    PT Plan Current plan remains appropriate    Co-evaluation             End of Session   Activity Tolerance: Patient tolerated treatment well Patient left: in bed;with call bell/phone within reach;with bed alarm set     Time: 1451-1510 PT Time Calculation (min) (ACUTE ONLY): 19 min  Charges:  $Therapeutic Activity: 8-22 mins                    G Codes:      Olen PelGarrigan, Fitzpatrick Alberico Hamff 05/10/2015, 4:49 PM  Van ClinesHolly Channing Savich, [place]PT  Acute Rehabilitation Services Pager [phone] Office [phone]

## 2015-05-10 NOTE — [place] FL2 (Signed)
[place] MEDICAID FL2 LEVEL OF CARE SCREENING TOOL     IDENTIFICATION  Patient Name: Stacey BruntBetty [name] Birthdate: 11/01/1940 Sex: female Admission Date (Current Location): 05/08/2015  [facility] [place]   Facility and Address:  [facility], [address], [place], [place] [address]      Provider Number: [id number]  Attending Physician Name and Address:  Leroy SeaPrashant K Singh, MD  Relative Name and Phone Number:       Current Level of Care: Hospital Recommended Level of Care: Skilled Nursing Facility Prior Approval Number:    Date Approved/Denied:   PASRR Number:   [id number]    Discharge Plan: SNF    Current Diagnoses: Patient Active Problem List   Diagnosis Date Noted  . Weakness 05/08/2015  . TIA (transient ischemic attack) 05/08/2015  . Dementia with behavioral disturbance 11/20/2013  . Agitation 11/20/2013  . Hypertension   . Hyperlipidemia   . CVA (cerebrovascular accident) (HCC)   . Coronary artery disease     Orientation RESPIRATION BLADDER Height & Weight    Self, Time  Normal Continent 5\' 3"  (160 cm) 142 lbs.  BEHAVIORAL SYMPTOMS/MOOD NEUROLOGICAL BOWEL NUTRITION STATUS   (NONE )  (NONE) Continent Diet (HEART HEALTHY )  AMBULATORY STATUS COMMUNICATION OF NEEDS Skin   Extensive Assist Verbally Normal                       Personal Care Assistance Level of Assistance  Bathing, Dressing Bathing Assistance: Limited assistance   Dressing Assistance: Limited assistance     Functional Limitations Info   (NONE)          SPECIAL CARE FACTORS FREQUENCY  PT (By licensed PT), OT (By licensed OT)     PT Frequency: 3 OT Frequency: 2            Contractures      Additional Factors Info  Code Status, Allergies Code Status Info: FULL CODE  Allergies Info: Seroquel           Current Medications (05/10/2015):  This is the current hospital active medication list Current Facility-Administered  Medications  Medication Dose Route Frequency Provider Last Rate Last Dose  .  stroke: mapping our early stages of recovery book   Does not apply Once Ron ParkerHarvette C Jenkins, MD      . atorvastatin (LIPITOR) tablet 40 mg  40 mg Oral q1800 Harvette Velora Heckler Jenkins, MD      . cefTRIAXone (ROCEPHIN) 1 g in dextrose 5 % 50 mL IVPB  1 g Intravenous Q24H Leroy SeaPrashant K Singh, MD 100 mL/hr at 05/09/15 1503 1 g at 05/09/15 1503  . clopidogrel (PLAVIX) tablet 75 mg  75 mg Oral Daily Ron ParkerHarvette C Jenkins, MD   75 mg at 05/09/15 1000  . donepezil (ARICEPT) tablet 10 mg  10 mg Oral QHS Ron ParkerHarvette C Jenkins, MD   10 mg at 05/09/15 2047  . enoxaparin (LOVENOX) injection 40 mg  40 mg Subcutaneous Daily Harvette C Jenkins, MD      . lisinopril (PRINIVIL,ZESTRIL) tablet 10 mg  10 mg Oral Daily Ron ParkerHarvette C Jenkins, MD   10 mg at 05/09/15 1000  . nitroGLYCERIN (NITROSTAT) SL tablet 0.4 mg  0.4 mg Sublingual Q5 min PRN Harvette Velora Heckler Jenkins, MD      . senna-docusate (Senokot-S) tablet 1 tablet  1 tablet Oral QHS PRN Ron ParkerHarvette C Jenkins, MD         Discharge Medications: Please see discharge  summary for a list of discharge medications.  Relevant Imaging Results:  Relevant Lab Results:   Additional Information SSN [id number]  Vaughan Browner, LCSW

## 2015-05-10 NOTE — Discharge Instructions (Signed)
Follow with PCP in 1 week, show MRI results as you have a brain Aneurysm.  Follow with Primary MD Leanor RubensteinSUN,VYVYAN Y, MD in 7 days   Get CBC, CMP, 2 view Chest X ray checked  by Primary MD next visit.    Activity: As tolerated with Full fall precautions use walker/cane & assistance as needed   Disposition SNF   Diet:   Heart Healthy with feeding assistance and aspiration precautions.  For Heart failure patients - Check your Weight same time everyday, if you gain over 2 pounds, or you develop in leg swelling, experience more shortness of breath or chest pain, call your Primary MD immediately. Follow Cardiac Low Salt Diet and 1.5 lit/day fluid restriction.   On your next visit with your primary care physician please Get Medicines reviewed and adjusted.   Please request your Prim.MD to go over all Hospital Tests and Procedure/Radiological results at the follow up, please get all Hospital records sent to your Prim MD by signing hospital release before you go home.   If you experience worsening of your admission symptoms, develop shortness of breath, life threatening emergency, suicidal or homicidal thoughts you must seek medical attention immediately by calling 911 or calling your MD immediately  if symptoms less severe.  You Must read complete instructions/literature along with all the possible adverse reactions/side effects for all the Medicines you take and that have been prescribed to you. Take any new Medicines after you have completely understood and accpet all the possible adverse reactions/side effects.   Do not drive, operating heavy machinery, perform activities at heights, swimming or participation in water activities or provide baby sitting services if your were admitted for syncope or siezures until you have seen by Primary MD or a Neurologist and advised to do so again.  Do not drive when taking Pain medications.    Do not take more than prescribed Pain, Sleep and Anxiety  Medications  Special Instructions: If you have smoked or chewed Tobacco  in the last 2 yrs please stop smoking, stop any regular Alcohol  and or any Recreational drug use.  Wear Seat belts while driving.   Please note  You were cared for by a hospitalist during your hospital stay. If you have any questions about your discharge medications or the care you received while you were in the hospital after you are discharged, you can call the unit and asked to speak with the hospitalist on call if the hospitalist that took care of you is not available. Once you are discharged, your primary care physician will handle any further medical issues. Please note that NO REFILLS for any discharge medications will be authorized once you are discharged, as it is imperative that you return to your primary care physician (or establish a relationship with a primary care physician if you do not have one) for your aftercare needs so that they can reassess your need for medications and monitor your lab values.

## 2015-05-10 NOTE — Care Management Note (Signed)
Case Management Note  Patient Details  Name: [name] MRN: [id number] Date of Birth: 01/16/1941  Subjective/Objective:   Patient admitted with TIA/ UTI. MRI results negative. Patient is from home with her son who she helps take care of.                Action/Plan: PT recommending SNF. CM continuing to follow for discharge needs.  Expected Discharge Date:                  Expected Discharge Plan:  Skilled Nursing Facility  In-House Referral:     Discharge planning Services     Post Acute Care Choice:    Choice offered to:     DME Arranged:    DME Agency:     HH Arranged:    HH Agency:     Status of Service:  In process, will continue to follow  Medicare Important Message Given:    Date Medicare IM Given:    Medicare IM give by:    Date Additional Medicare IM Given:    Additional Medicare Important Message give by:     If discussed at Long Length of Stay Meetings, dates discussed:    Additional Comments:  [name], RN 05/10/2015, 11:52 AM

## 2015-05-10 NOTE — Clinical Social Work Placement (Signed)
   CLINICAL SOCIAL WORK PLACEMENT  NOTE  Date:  05/10/2015  Patient Details  Name: Stacey BruntBetty Ladouceur MRN: [id number] Date of Birth: 12/25/1940  Clinical Social Work is seeking post-discharge placement for this patient at the Skilled  Nursing Facility level of care (*CSW will initial, date and re-position this form in  chart as items are completed):  Yes   Patient/family provided with [facility] Clinical Social Work Department's list of facilities offering this level of care within the geographic area requested by the patient (or if unable, by the patient's family).  Yes   Patient/family informed of their freedom to choose among providers that offer the needed level of care, that participate in Medicare, Medicaid or managed care program needed by the patient, have an available bed and are willing to accept the patient.  Yes   Patient/family informed of [facility]'s ownership interest in [facility] and [facility], as well as of the fact that they are under no obligation to receive care at these facilities.  PASRR submitted to EDS on 05/10/15     PASRR number received on 05/10/15     Existing PASRR number confirmed on       FL2 transmitted to all facilities in geographic area requested by pt/family on 05/10/15     FL2 transmitted to all facilities within larger geographic area on       Patient informed that his/her managed care company has contracts with or will negotiate with certain facilities, including the following:        Yes   Patient/family informed of bed offers received.  Patient chooses bed at  [facility] )     Physician recommends and patient chooses bed at      Patient to be transferred to  [facility] ) on 05/10/15.  Patient to be transferred to facility by  [facility])     Patient family notified on 05/10/15 of transfer.  Name of family member notified:   (Pt's dtr, Byrd HesselbachMaria )     PHYSICIAN Please sign FL2     Additional  Comment:    _______________________________________________ Vaughan BrownerNixon, Florencio Hollibaugh A, LCSW 05/10/2015, 3:49 PM

## 2015-05-10 NOTE — Care Management Obs Status (Signed)
MEDICARE OBSERVATION STATUS NOTIFICATION   Patient Details  Name: Stacey BruntBetty Hirata MRN: [id number] Date of Birth: 09/22/1940   Medicare Observation Status Notification Given:  Yes    Kermit BaloKelli F Thora Scherman, RN 05/10/2015, 4:23 PM

## 2015-05-10 NOTE — Discharge Summary (Signed)
[name], is a 75 y.o. female  DOB 12/21/1940  MRN [id number].  Admission date:  05/08/2015  Admitting Physician  Ron Parker, MD  Discharge Date:  05/10/2015   Primary MD  Leanor Rubenstein, MD  Recommendations for primary care physician for things to follow:   Check final urine culture results.   Admission Diagnosis  TIA (transient ischemic attack) [G45.9] Stroke with cerebral ischemia (HCC) [I63.9]   Discharge Diagnosis  TIA (transient ischemic attack) [G45.9] Stroke with cerebral ischemia (HCC) [I63.9]    Principal Problem:   TIA (transient ischemic attack) Active Problems:   Hypertension   Hyperlipidemia   CVA (cerebrovascular accident) (HCC)   Coronary artery disease   Weakness   Stroke with cerebral ischemia [facility])      Past Medical History  Diagnosis Date  . Hypertension   . Hyperlipidemia   . Coronary artery disease 2000    STATUS POT [facility] STENTING OF THE LAD   . CVA (cerebrovascular accident) (HCC) 2010    Past Surgical History  Procedure Laterality Date  . None         HPI  from the history and physical done on the day of admission:    [name] is a 75 y.o. female with a history of CVA ( 05/2008), CAD, HTN, Hyperlipidemia and Dementia who presents to the ED with complaints of right sided numbness and confusion with symptoms that started around 5 pm. Her Daughter witnessed the event where she fell , and had increased weakness of her right side after falling. EMS was called and she was seen in the ED and evaluated as a Code Stroke initially, and a CT scan of the head was performed which was negative for acute findings but reveals old Left Posterior MCA infarct changes. Neurology was consulted and she was referred for a TIA Workup.    Hospital Course:     1. Worsening  of chronic right-sided hemiparesis. Likely brought on by UTI. Seen by neurology, MRI nonacute, cleared by neurology from stroke standpoint. She is currently on Plavix and statin for secondary prevention for her previous left MCA territory stroke which will be continued. Unremarkable bilateral carotid duplex, go gram shows a preserved EF of 60% with chronic grade 1 diastolic dysfunction, received PT will require SNF.    2. UTI. Place on empiric antibiotics and monitor.   3. Essential hypertension. Continue home dose Lisinopril.   4. Mild bradycardia at rest. Discontinue home dose beta blocker as resting heart rate was in mid 40s, TSH was stable at 1.8, after holding beta blocker resting heart rate in mid 60s.   5. Mild dementia. Continue home regimen. At risk for delirium.   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    Discharge Condition: Stable  Follow UP  Follow-up Information    Follow up with Leanor Rubenstein, MD. Schedule an appointment as soon as possible for a visit in 1 week.  Specialty:  Family Medicine   Contact information:   [phone] [address] [place] [address] [phone]       Follow up with [facility]. Schedule an appointment as soon as possible for a visit in 1 week.   Contact information:   [address]     [address] [name] [address] [phone]      Follow up with Oneal Grout, MD. Schedule an appointment as soon as possible for a visit in 1 week.   Specialty:  Interventional Radiology   Why:  aneurysm   Contact information:   [address] [place] [address] [phone]        Consults obtained - Neuro  Diet and Activity recommendation: See Discharge Instructions below  Discharge Instructions           Discharge Instructions    Discharge instructions    Complete by:  As directed    Follow with PCP in 1 week, show MRI results as you have a brain Aneurysm.  Follow with Primary MD Leanor Rubenstein, MD in 7 days   Get CBC, CMP, 2 view Chest X ray checked  by Primary MD next visit.    Activity: As tolerated with Full fall precautions use walker/cane & assistance as needed   Disposition SNF   Diet:   Heart Healthy with feeding assistance and aspiration precautions.  For Heart failure patients - Check your Weight same time everyday, if you gain over 2 pounds, or you develop in leg swelling, experience more shortness of breath or chest pain, call your Primary MD immediately. Follow Cardiac Low Salt Diet and 1.5 lit/day fluid restriction.   On your next visit with your primary care physician please Get Medicines reviewed and adjusted.   Please request your Prim.MD to go over all Hospital Tests and Procedure/Radiological results at the follow up, please get all Hospital records sent to your Prim MD by signing hospital release before you go home.   If you experience worsening of your admission symptoms, develop shortness of breath, life threatening emergency, suicidal or homicidal thoughts you must seek medical attention immediately by calling 911 or calling your MD immediately  if symptoms less severe.  You Must read complete instructions/literature along with all the possible adverse reactions/side effects for all the Medicines you take and that have been prescribed to you. Take any new Medicines after you have completely understood and accpet all the possible adverse reactions/side effects.   Do not drive, operating heavy machinery, perform activities at heights, swimming or participation in water activities or provide baby sitting services if your were admitted for syncope or siezures until you have seen by Primary MD or a Neurologist and advised to do so again.  Do not drive when taking Pain medications.    Do not take more than prescribed Pain, Sleep and Anxiety  Medications  Special Instructions: If you have smoked or chewed Tobacco  in the last 2 yrs please stop smoking, stop any regular Alcohol  and or any Recreational drug use.  Wear Seat belts while driving.   Please note  You were cared for by a hospitalist during your hospital stay. If you have any questions about your discharge medications or the care you received while you were in the hospital after you are discharged, you can call the unit and asked to speak with the hospitalist on call if the hospitalist that took care of you is not available.  Once you are discharged, your primary care physician will handle any further medical issues. Please note that NO REFILLS for any discharge medications will be authorized once you are discharged, as it is imperative that you return to your primary care physician (or establish a relationship with a primary care physician if you do not have one) for your aftercare needs so that they can reassess your need for medications and monitor your lab values.     Increase activity slowly    Complete by:  As directed              Discharge Medications       Medication List    STOP taking these medications        metoprolol tartrate 25 MG tablet  Commonly known as:  LOPRESSOR      TAKE these medications        ARICEPT 10 MG tablet  Generic drug:  donepezil  TAKE 1 TABLET BY MOUTH EVERY DAY AT BEDTIME     atorvastatin 40 MG tablet  Commonly known as:  LIPITOR  TAKE 1 TABLET BY MOUTH EVERY DAY     cefpodoxime 200 MG tablet  Commonly known as:  VANTIN  Take 1 tablet (200 mg total) by mouth 2 (two) times daily.     clopidogrel 75 MG tablet  Commonly known as:  PLAVIX  TAKE 1 TABLET BY MOUTH EVERY DAY     divalproex 125 MG DR tablet  Commonly known as:  DEPAKOTE  TAKE 2 TABLETS EVERY DAY     lisinopril 10 MG tablet  Commonly known as:  PRINIVIL,ZESTRIL  TAKE 1 TABLET EVERY DAY     NAMENDA XR TITRATION PACK 7 & 14 & 21 &28 MG Cp24  Generic  drug:  Memantine HCl ER  Take 7 mg by mouth daily.     memantine 28 MG Cp24 24 hr capsule  Commonly known as:  NAMENDA XR  Take 28 mg by mouth daily.     nitroGLYCERIN 0.4 MG SL tablet  Commonly known as:  NITROSTAT  Place 1 tablet (0.4 mg total) under the tongue every 5 (five) minutes as needed for chest pain.        Major procedures and Radiology Reports - PLEASE review detailed and final reports for all details, in brief -    TTE  Left ventricle: The cavity size was normal. There was moderateconcentric hypertrophy. Systolic function was normal. The estimated ejection fraction was in the range of 60% to 65%. Wallmotion was normal; there were no regional wall motion abnormalities. There was an increased relative contribution of atrial contraction to ventricular filling. Doppler parameters are consistent with abnormal left ventricular relaxation (grade 1 diastolic dysfunction). - Mitral valve: Calcified annulus.   Ct Head Wo Contrast  05/08/2015  CLINICAL DATA:  Right-sided weakness and altered mental status. History of old infarct. EXAM: CT HEAD WITHOUT CONTRAST TECHNIQUE: Contiguous axial images were obtained from the base of the skull through the vertex without intravenous contrast. COMPARISON:  Brain MRI, 12/12/2013.  Head CT, 06/20/2011. FINDINGS: Ventricles are normal configuration. There is ex vacuo dilation of the posterior left lateral ventricle due to an old left sided infarct that involves the majority of the left parietal lobe posterior left temporal lobe and portions of the left occipital lobe. This is stable from the prior exams. There is no hydrocephalus. There are no parenchymal masses or mass effect. There is no evidence of a recent cortical infarct. Other patchy areas of  white matter hypoattenuation are noted consistent with moderate chronic microvascular ischemic change, also stable. There are no extra-axial masses or abnormal fluid collections. There is no intracranial  hemorrhage. Visualized sinuses and mastoid air cells are clear. IMPRESSION: 1. No acute intracranial abnormalities. 2. Old left posterior MCA distribution infarct. 3. Moderate chronic microvascular ischemic change. 4. Stable appearance from the prior head CT and brain MRI. Electronically Signed   By: Amie Portlandavid  Ormond M.D.   On: 05/08/2015 20:23   Mr Maxine GlennMra Head Wo Contrast  05/09/2015  CLINICAL DATA:  Larey SeatFell twice, worsening RIGHT-sided weakness. Unable to walk. History of hypertension, hyperlipidemia, stroke 2010 with residual mild RIGHT hemi paresis. EXAM: MRI HEAD WITHOUT CONTRAST MRA HEAD WITHOUT CONTRAST TECHNIQUE: Multiplanar, multiecho pulse sequences of the brain and surrounding structures were obtained without intravenous contrast. Angiographic images of the head were obtained using MRA technique without contrast. COMPARISON:  CT head May 07, 2014 at 2016 hours and MRI of the brain December 12, 2013 and MRA head December 01, 2009 FINDINGS: MRI HEAD FINDINGS No reduced diffusion to suggest acute ischemia. LEFT temporal parietal occipital encephalomalacia with faint susceptibility artifact compatible with mineralization. Nonspecific punctate focus of susceptibility artifact RIGHT frontal lobe. Ex vacuo dilatation of LEFT occipital horn, ventricles and sulci are otherwise normal for patient's age. Patchy to confluent supratentorial white matter T2 hyperintensities stable from prior MRI. No midline shift, mass effect or mass lesions. Prominent perivascular spaces are unchanged. No abnormal extra-axial fluid collections. Ocular globes and orbital contents are normal. Trace paranasal sinus mucosal thickening without air-fluid levels. The mastoid air cells are well aerated. No abnormal sellar expansion. No cerebellar tonsillar ectopia. No suspicious calvarial bone marrow signal. Patient appears edentulous. MRA HEAD FINDINGS Anterior circulation: Normal flow related enhancement of the included cervical, petrous, cavernous  and supraclinoid internal carotid arteries. 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 Stable appearance of 5 mm superiorly directed LEFT para ophthalmic aneurysm. Patent anterior communicating artery. Normal flow related enhancement of the anterior cerebral arteries, including distal segments. Paucity of mid to distal LEFT MCA branches compatible with old infarct. No large vessel occlusion, high-grade stenosis, abnormal luminal irregularity. Posterior circulation: RIGHT vertebral artery is dominant. Basilar artery is patent, with normal flow related enhancement of the main branch vessels. Normal flow related enhancement of the posterior cerebral arteries. Fetal origin RIGHT posterior cerebral artery. No large vessel occlusion, high-grade stenosis, abnormal luminal irregularity, aneurysm. IMPRESSION: MRI HEAD: No acute intracranial process, specifically no acute ischemia. Old large LEFT MCA territory infarct. Moderate to severe chronic small vessel ischemic disease. MRA HEAD: No acute large vessel occlusion or high-grade stenosis. Paucity of mid to distal LEFT MCA branches compatible with old infarct. 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 Electronically Signed   By: Awilda Metroourtnay  Bloomer M.D.   On: 05/09/2015 01:02   Mr Brain Wo Contrast  05/09/2015  CLINICAL DATA:  Larey SeatFell twice, worsening RIGHT-sided weakness. Unable to walk. History of hypertension, hyperlipidemia, stroke 2010 with residual mild RIGHT hemi paresis. EXAM: MRI HEAD WITHOUT CONTRAST MRA HEAD WITHOUT CONTRAST TECHNIQUE: Multiplanar, multiecho pulse sequences of the brain and surrounding structures were obtained without intravenous contrast. Angiographic images of the head were obtained using MRA technique without contrast. COMPARISON:  CT head May 07, 2014 at 2016 hours and MRI of the brain December 12, 2013 and MRA head December 01, 2009 FINDINGS:  MRI HEAD FINDINGS No reduced diffusion to  suggest acute ischemia. LEFT temporal parietal occipital encephalomalacia with faint susceptibility artifact compatible with mineralization. Nonspecific punctate focus of susceptibility artifact RIGHT frontal lobe. Ex vacuo dilatation of LEFT occipital horn, ventricles and sulci are otherwise normal for patient's age. Patchy to confluent supratentorial white matter T2 hyperintensities stable from prior MRI. No midline shift, mass effect or mass lesions. Prominent perivascular spaces are unchanged. No abnormal extra-axial fluid collections. Ocular globes and orbital contents are normal. Trace paranasal sinus mucosal thickening without air-fluid levels. The mastoid air cells are well aerated. No abnormal sellar expansion. No cerebellar tonsillar ectopia. No suspicious calvarial bone marrow signal. Patient appears edentulous. MRA HEAD FINDINGS Anterior circulation: Normal flow related enhancement of the included cervical, petrous, cavernous and supraclinoid internal carotid arteries. 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 Stable appearance of 5 mm superiorly directed LEFT para ophthalmic aneurysm. Patent anterior communicating artery. Normal flow related enhancement of the anterior cerebral arteries, including distal segments. Paucity of mid to distal LEFT MCA branches compatible with old infarct. No large vessel occlusion, high-grade stenosis, abnormal luminal irregularity. Posterior circulation: RIGHT vertebral artery is dominant. Basilar artery is patent, with normal flow related enhancement of the main branch vessels. Normal flow related enhancement of the posterior cerebral arteries. Fetal origin RIGHT posterior cerebral artery. No large vessel occlusion, high-grade stenosis, abnormal luminal irregularity, aneurysm. IMPRESSION: MRI HEAD: No acute intracranial process, specifically no acute ischemia. Old large LEFT MCA  territory infarct. Moderate to severe chronic small vessel ischemic disease. MRA HEAD: No acute large vessel occlusion or high-grade stenosis. Paucity of mid to distal LEFT MCA branches compatible with old infarct. 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 Electronically Signed   By: Awilda Metro M.D.   On: 05/09/2015 01:02   Dg Foot Complete Right  05/10/2015  CLINICAL DATA:  Right foot pain. Possible fall 3-4 days ago. Midfoot bruising changes. EXAM: RIGHT FOOT COMPLETE - 3+ VIEW COMPARISON:  None. FINDINGS: Moderate midfoot degenerative changes with possible erosive findings. No acute fracture is identified. Hallux valgus deformity and degenerative change at the first metatarsal phalangeal joint. IMPRESSION: Midfoot degenerative changes but no acute fracture. Electronically Signed   By: Rudie Meyer M.D.   On: 05/10/2015 09:44    Micro Results      No results found for this or any previous visit (from the past 240 hour(s)).     Today   Subjective    [name] today has no headache,no chest abdominal pain,no new weakness tingling or numbness, feels much better    Objective   Blood pressure 121/61, pulse 76, temperature 98.3 F (36.8 C), temperature source Oral, resp. rate 18, height 5\' 3"  (1.6 m), weight 64.592 kg (142 lb 6.4 oz), SpO2 97 %.   Intake/Output Summary (Last 24 hours) at 05/10/15 1331 Last data filed at 05/10/15 1200  Gross per 24 hour  Intake    960 ml  Output      0 ml  Net    960 ml    Exam Awake, pleasantly confused at baseline, No new F.N deficits, Normal affect [place].AT,PERRAL Supple Neck,No JVD, No cervical lymphadenopathy appriciated.  Symmetrical Chest wall movement, Good air movement bilaterally, CTAB RRR,No Gallops,Rubs or new Murmurs, No Parasternal Heave +ve B.Sounds, Abd Soft, Non tender, No organomegaly appriciated, No rebound -guarding or rigidity. No Cyanosis, Clubbing or edema, No  new Rash or bruise   Data Review  CBC w Diff:  Lab Results  Component Value Date   WBC 10.2 05/08/2015   WBC 6.0 11/05/2013   HGB 13.9 05/08/2015   HCT 40.4 05/08/2015   PLT 180 05/08/2015   LYMPHOPCT 18 05/08/2015   MONOPCT 6 05/08/2015   EOSPCT 2 05/08/2015   BASOPCT 0 05/08/2015    CMP:  Lab Results  Component Value Date   NA 143 05/08/2015   NA 143 11/05/2013   K 3.8 05/08/2015   CL 109 05/08/2015   CO2 24 05/08/2015   BUN 15 05/08/2015   BUN 14 11/05/2013   CREATININE 0.64 05/08/2015   PROT 5.9* 05/08/2015   PROT 6.4 11/05/2013   ALBUMIN 3.7 05/08/2015   ALBUMIN 4.4 11/05/2013   BILITOT 0.6 05/08/2015   ALKPHOS 70 05/08/2015   AST 29 05/08/2015   ALT 20 05/08/2015  .   Total Time in preparing paper work, data evaluation and todays exam - 35 minutes  Leroy Sea M.D on 05/10/2015 at 1:31 PM  [facility]   Office  [phone]

## 2015-05-10 NOTE — Evaluation (Signed)
Speech Language Pathology Evaluation Patient Details Name: [name] MRN: [id number] DOB: 1940-09-26 Today's Date: 05/10/2015 Time: 4098-1191 SLP Time Calculation (min) (ACUTE ONLY): 22 min  Problem List:  Patient Active Problem List   Diagnosis Date Noted  . Weakness 05/08/2015  . TIA (transient ischemic attack) 05/08/2015  . Dementia with behavioral disturbance 11/20/2013  . Agitation 11/20/2013  . Hypertension   . Hyperlipidemia   . CVA (cerebrovascular accident) (HCC)   . Coronary artery disease    Past Medical History:  Past Medical History  Diagnosis Date  . Hypertension   . Hyperlipidemia   . Coronary artery disease 2000    STATUS POT PTCA ND STENTING OF THE LAD   . CVA (cerebrovascular accident) (HCC) 2010   Past Surgical History:  Past Surgical History  Procedure Laterality Date  . None     HPI:  75 yo female admitted 05/08/15 after fall x2. Patient with Rt-sided numbness that resolved, and confusion. MRI showed no acute infarct. Patient also with UTI. PMH: CVA in 2010 with mild Rt hemi and aphasia; CAD, HTN, HLD, dementia. Discussed prior status with daughter, [name].  Pt has been living alone, with adult son who has CP. Her daughter works full time, then spends the evenings taking care of her mother and brother before going home to her own family.  She expressed exhaustion and hope that her mother can be transitioned to a SNF.     Assessment / Plan / Recommendation Clinical Impression  Pt presents with a moderate, fluent aphasia with deficits in comprehension and expression; semantic and phonemic paraphasias; difficulty following commands.  Talked with dtr, [name], via phone, who reports that her mother is at baseline with her communication since her 2010 CVA.  [name] has been helping her mother and disabled brother in the evenings after work, and verbalizes difficulty managing their care while balancing work and her own family.  She is hoping for SNF placement as she  does not believe her mother can safely manage at home; she is exploring other living arrangements for her disabled brother.  Recommend SLP at SNF to address communication, as aphasia therapy has been shown to have benefit years post-stroke.      SLP Assessment  All further Speech Lanaguage Pathology  needs can be addressed in the next venue of care    Follow Up Recommendations  Skilled Nursing facility    Frequency and Duration           SLP Evaluation Prior Functioning  Cognitive/Linguistic Baseline: Baseline deficits Baseline deficit details: speech/expressive language per notes Type of Home: House Available Help at Discharge: Family;Available PRN/intermittently   Cognition  Overall Cognitive Status: History of cognitive impairments - at baseline Arousal/Alertness: Awake/alert Orientation Level: Oriented to person;Oriented to situation Attention: Sustained Sustained Attention: Appears intact Memory: Impaired    Comprehension  Auditory Comprehension Overall Auditory Comprehension: Impaired at baseline Yes/No Questions: Impaired Basic Immediate Environment Questions: 50-74% accurate Commands: Impaired Two Step Basic Commands: 0-24% accurate Conversation: Simple Visual Recognition/Discrimination Discrimination: Exceptions to [facility] Black/White Line Drawings:  (60%) Reading Comprehension Reading Status: Impaired    Expression Expression Primary Mode of Expression: Verbal Verbal Expression Overall Verbal Expression: Impaired at baseline Initiation: No impairment Level of Generative/Spontaneous Verbalization: Conversation Repetition: No impairment Naming: Impairment Responsive: 26-50% accurate Confrontation: Impaired Black/White Line Drawings:  (60%) Convergent: 25-49% accurate Divergent: 0-24% accurate Verbal Errors: Semantic paraphasias;Phonemic paraphasias Pragmatics: No impairment   Oral / Motor Oral Motor/Sensory Function Overall Oral Motor/Sensory Function:  Within functional  limits Motor Speech Overall Motor Speech: Appears within functional limits for tasks assessed    [name], [name] [name] 05/10/2015, 11:45 AM  Stacey FolksAmanda L. [name], [place] CCC/SLP Pager [phone]

## 2015-05-10 NOTE — Progress Notes (Signed)
Report called to [facility]. [phone]  [facility] still not here

## 2015-05-10 NOTE — Progress Notes (Signed)
Pt d/c via [facility] to [facility].  Pt belongings sent with pt.  Extra AVS d/c summary given to family.

## 2015-05-10 NOTE — Clinical Social Work Note (Signed)
Clinical Social Worker facilitated patient discharge including contacting patient family and facility to confirm patient discharge plans.  Clinical information faxed to facility and family agreeable with plan.  CSW arranged ambulance transport via [facility] to [facility] .  RN to call report prior to discharge.  Clinical Social Worker will sign off for now as social work intervention is no longer needed. Please consult us again if new need arises.  [name], MSW, LCSWA [phone] 05/10/2015 3:49 PM

## 2015-05-10 NOTE — Clinical Social Work Note (Signed)
Clinical Social Work Assessment  Patient Details  Name: Stacey BruntBetty Gedeon MRN: [id number] Date of Birth: 04/26/1941  Date of referral:  05/10/15               Reason for consult:  Discharge Planning, Facility Placement                Permission sought to share information with:  Family Supports, Guardian, Magazine features editoracility Contact Representative Permission granted to share information::  Yes, Verbal Permission Granted  Name::      [name](Maria Stump)  Agency::   (SNF's )  Relationship::   (Daughter )  Contact Information:   [phone])  Housing/Transportation Living arrangements for the past 2 months:  Single Family Home Source of Information:  Adult Children Patient Interpreter Needed:  None Criminal Activity/Legal Involvement Pertinent to Current Situation/Hospitalization:  No - Comment as needed Significant Relationships:  Adult Children Lives with:  Adult Children Do you feel safe going back to the place where you live?  Yes Need for family participation in patient care:  Yes (Comment)  Care giving concerns:  Patient requiring 24 hour assistance and supervision at skilled level of care. Patient's daughter.   Social Worker assessment / plan:  Visual merchandiserClinical Social Worker spoke with patient's dtr, Byrd HesselbachMaria in reference to post-acute placement for SNF. CSW introduced CSW role and SNF process. Pt's dtr stated currently is she the primary care giver of pt and her disabled brother. Pt's dtr further reported that she is overwhelmed with the amount of care she is currently providing for her mother and brother. Pt's dtr agreeable to SNF placement and will await bed offers from SNF's. CSW also explained Medicaid process and dtr reported she does NOT believe pt will qualify for Medicaid due to assets. Pt's dtr further stated that although pt does not qualify due to financial assets, pt does not have enough money to afford private duty care. No further concerns reported at this time.   Employment status:   Retired Database administratornsurance information:  Managed Medicare PT Recommendations:  Skilled Nursing Facility Information / Referral to community resources:  Skilled Nursing Facility  Patient/Family's Response to care:  Pt disoriented with dementia. Pt's dtr agreeable to SNF placement. Pt's dtr tearful when discussing care giver situation. Pt's dtr pleasant and appreciated social work intervention.   Patient/Family's Understanding of and Emotional Response to Diagnosis, Current Treatment, and Prognosis:  Pt's dtr knowledgeable of medical interventions and aware of possible d/c to SNF today.   Emotional Assessment Appearance:  Appears stated age Attitude/Demeanor/Rapport:  Unable to Assess Affect (typically observed):  Unable to Assess Orientation:  Oriented to Self Alcohol / Substance use:  Not Applicable Psych involvement (Current and /or in the community):  No (Comment)  Discharge Needs  Concerns to be addressed:  Care Coordination Readmission within the last 30 days:  No Current discharge risk:  Cognitively Impaired, Dependent with Mobility Barriers to Discharge:  Continued Medical Work up   The Sherwin-WilliamsBashira Zahir Eisenhour, MSW, LCSWA [phone] 05/10/2015 11:54 AM

## 2015-05-10 NOTE — Evaluation (Signed)
Occupational Therapy Evaluation Patient Details Name: [name] MRN: [id number] DOB: 05-10-1940 Today's Date: 05/10/2015    History of Present Illness Patient is a 75 yo female admitted 05/08/15 after fall x2.  Patient with Rt-sided numbness that resolved, and confusion.  MRI showed no acute infarct.  Patient also with UTI.  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 Pt would benefit from cont OT to increase independence with basic adls so she can eventually return home.  Pt cares for herself at home as well as her son who has CP.   Son also has CAPS worker to assist but no other assist available at home at this time.  Pt's cognition is very difficult to assess due to her extensive aphasia from old CVA and now pt with new R knee pain that limited pt's mobility.  Waiting for Xray before progressing further with mobility.  For above reasons, feel SNF may be the best DC plan at this time.      Follow Up Recommendations  SNF;Supervision/Assistance - 24 hour    Equipment Recommendations  3 in 1 bedside comode;Other (comment) (unsure if she has [facility].)    Recommendations for Other Services       Precautions / Restrictions Precautions Precautions: Fall Precaution Comments: Falls x2 pta. Restrictions Weight Bearing Restrictions: No Other Position/Activity Restrictions: Pt with pain in RLE      Mobility Bed Mobility Overal bed mobility: Needs Assistance Bed Mobility: Supine to Sit;Sidelying to Sit     Supine to sit: Mod assist Sit to supine: Min assist   General bed mobility comments: Pt was able to bed L leg and assist therapist to move up in bed.  Pointed in direction pt needed to move and she did very well.  Does not do as well with verbal commands.  Transfers Overall transfer level: Needs assistance Equipment used: 1 person hand held  assist Transfers: Sit to/from [employer] Sit to Stand: Mod assist Stand pivot transfers: Max assist       General transfer comment: Once up, pt seemed to be anxious about moving but was able to weightshift and move to [facility] with no increase in pain.    Balance Overall balance assessment: Needs assistance Sitting-balance support: Feet supported Sitting balance-Leahy Scale: Fair Sitting balance - Comments: UE support primarily due to knee pain impacting balance.   Standing balance support: Bilateral upper extremity supported;During functional activity Standing balance-Leahy Scale: Zero Standing balance comment: Pt heavily relied on outside support to stand.                            ADL Overall ADL's : Needs assistance/impaired Eating/Feeding: Set up;Sitting   Grooming: Oral care;Wash/dry face;Wash/dry hands;Set up;Cueing for sequencing;Sitting   Upper Body Bathing: Set up;Sitting;Cueing for sequencing   Lower Body Bathing: Maximal assistance;Cueing for sequencing;Sit to/from stand Lower Body Bathing Details (indicate cue type and reason): Pt very fearful of standing.  Once reassured did pretty well transferring to stand to bathe.  Pt with obvious new pain in R knee therefore further mobility deferred until after xray. Upper Body Dressing : Minimal assistance;Sitting;Cueing for sequencing   Lower Body Dressing: Maximal assistance;Cueing for sequencing;Sit to/from stand Lower Body Dressing Details (indicate cue type and reason): Pt unable to reach down to start pants over feet  or to attempt to donn socks due to pain in R knee.  Standing limited due to pain as well. Toilet Transfer: Maximal assistance;Stand-pivot;BSC;Cueing for safety;Cueing for sequencing Toilet Transfer Details (indicate cue type and reason): Pt did well when therapist points direction we are going.  Toileting- Clothing Manipulation and Hygiene: Maximal assistance;Sit to/from  stand Toileting - Clothing Manipulation Details (indicate cue type and reason): Pt was able to let go of therapist and assist with pulling up diaper once standing. Tub/ Shower Transfer:  (pt sponge bathes at home as far as I can tell.)   Functional mobility during ADLs: Moderate assistance General ADL Comments: Pt limited with LE adls at this time due to knee pain.  Will further evaluate once xray is complete.     Vision Vision Assessment?: No apparent visual deficits Additional Comments: Will continue to monitor in functional setting.   Perception Perception Perception Tested?: No   Praxis Praxis Praxis tested?: Not tested    Pertinent Vitals/Pain Pain Assessment: Faces Faces Pain Scale: Hurts even more Pain Location: R kee Pain Descriptors / Indicators: Grimacing;Guarding Pain Intervention(s): Limited activity within patient's tolerance;Monitored during session;Repositioned;Other (comment) (avoided ambulation since no xray yet)     Hand Dominance Left   Extremity/Trunk Assessment Upper Extremity Assessment Upper Extremity Assessment: Overall WFL for tasks assessed (Pt mildly weaker in RUE from old CVA but functional.)   Lower Extremity Assessment Lower Extremity Assessment: Defer to PT evaluation       Communication Communication Communication: Expressive difficulties;Receptive difficulties   Cognition Arousal/Alertness: Awake/alert Behavior During Therapy: Impulsive Overall Cognitive Status: Difficult to assess Area of Impairment: Orientation;Memory;Following commands;Safety/judgement;Problem solving;Attention Orientation Level: Disoriented to;Place;Time (hard to assess due to expressive aphasia) Current Attention Level: Sustained Memory: Decreased short-term memory Following Commands: Follows one step commands inconsistently;Follows one step commands with increased time Safety/Judgement: Decreased awareness of safety   Problem Solving: Slow processing;Difficulty  sequencing;Requires verbal cues;Requires tactile cues General Comments: Pt with significant speech deficits making cognitive assessment challenging.  Pt did seem to follow gesteral cues well today compared to yesterday.  Pointed to where she needed to go and pt responded quickly.  Feel a lot of her deficits are communication based.   General Comments       Exercises       Shoulder Instructions      Home Living Family/patient expects to be discharged to:: Private residence Living Arrangements: Children;Other (Comment) (cares for son with CP) Available Help at Discharge: Family;Available PRN/intermittently Type of Home: House Home Access: Stairs to enter [employer] of Steps: 3 Entrance Stairs-Rails: Right Home Layout: Two level;Able to live on main level with bedroom/bathroom     Bathroom Shower/Tub: Other (comment) (pt sponge bathes)   Bathroom Toilet: Standard     Home Equipment: None   Additional Comments: Pt cares for son with CP. Son also has CAPS services.  Pts speech very affected from previous CVA.  It seems that pt does not get in tub anymore but only sponge bathes.  Need to confirm with family.      Prior Functioning/Environment Level of Independence: Independent        Comments: Pt does not drive.  Pt has significant speech deficits from old CVA.  More expressive aphasic than receptive.    OT Diagnosis: Generalized weakness;Cognitive deficits;Acute pain   OT Problem List: Decreased strength;Impaired balance (sitting and/or standing);Decreased cognition;Decreased safety awareness;Decreased knowledge of use of DME or AE;Decreased knowledge of precautions;Pain;Increased edema   OT Treatment/Interventions: Self-care/ADL training;Therapeutic activities;DME and/or AE  instruction;Balance training    OT Goals(Current goals can be found in the care plan section) Acute Rehab OT Goals Patient Stated Goal: unable to state OT Goal Formulation: With  patient Time For Goal Achievement: 05/24/15 Potential to Achieve Goals: Good ADL Goals Pt Will Perform Grooming: with min guard assist;standing Pt Will Perform Lower Body Bathing: with min assist;sit to/from stand Pt Will Perform Lower Body Dressing: with min assist;sit to/from stand Additional ADL Goal #1: Pt will walk to bathroom with assistive device and toilet with min assist. Additional ADL Goal #2: Pt will complete two ADLS in standing with min guard assist to increase standing balance and prepare for kitchen activities in standing.  OT Frequency: Min 2X/week   Barriers to D/C: Decreased caregiver support  pt does not have 24/7 assist and actually caregives for her son with CP who also has CAPS worker.       Co-evaluation              End of Session Nurse Communication: Mobility status;Other (comment) (inquired about R knee xray)  Activity Tolerance: Patient limited by pain Patient left: in bed;with call bell/phone within reach;with bed alarm set   Time: [id number] OT Time Calculation (min): 27 min Charges:  OT General Charges $OT Visit: 1 Procedure OT Evaluation $OT Eval Moderate Complexity: 1 Procedure OT Treatments $Self Care/Home Management : 8-22 mins G-Codes: OT G-codes **NOT FOR INPATIENT CLASS** Functional Assessment Tool Used: clinical judgement Functional Limitation: Self care Self Care Current Status ([id number]): At least 60 percent but less than 80 percent impaired, limited or restricted Self Care Goal Status ([id number]): At least 20 percent but less than 40 percent impaired, limited or restricted  Hope BuddsJones, Clorinda Wyble Anne 05/10/2015, 8:19 AM  [phone]

## 2015-05-11 ENCOUNTER — Encounter: Payer: Self-pay | Admitting: Internal Medicine

## 2015-05-11 ENCOUNTER — Non-Acute Institutional Stay (SKILLED_NURSING_FACILITY): Payer: Medicare Other | Admitting: Internal Medicine

## 2015-05-11 DIAGNOSIS — I25118 Atherosclerotic heart disease of native coronary artery with other forms of angina pectoris: Secondary | ICD-10-CM

## 2015-05-11 DIAGNOSIS — R451 Restlessness and agitation: Secondary | ICD-10-CM | POA: Diagnosis not present

## 2015-05-11 DIAGNOSIS — N390 Urinary tract infection, site not specified: Secondary | ICD-10-CM | POA: Insufficient documentation

## 2015-05-11 DIAGNOSIS — N3000 Acute cystitis without hematuria: Secondary | ICD-10-CM | POA: Diagnosis not present

## 2015-05-11 DIAGNOSIS — F0391 Unspecified dementia with behavioral disturbance: Secondary | ICD-10-CM

## 2015-05-11 DIAGNOSIS — I10 Essential (primary) hypertension: Secondary | ICD-10-CM | POA: Diagnosis not present

## 2015-05-11 DIAGNOSIS — I63312 Cerebral infarction due to thrombosis of left middle cerebral artery: Secondary | ICD-10-CM

## 2015-05-11 DIAGNOSIS — G451 Carotid artery syndrome (hemispheric): Secondary | ICD-10-CM

## 2015-05-11 DIAGNOSIS — R001 Bradycardia, unspecified: Secondary | ICD-10-CM

## 2015-05-11 DIAGNOSIS — E785 Hyperlipidemia, unspecified: Secondary | ICD-10-CM

## 2015-05-11 DIAGNOSIS — I671 Cerebral aneurysm, nonruptured: Secondary | ICD-10-CM

## 2015-05-11 DIAGNOSIS — F03918 Unspecified dementia, unspecified severity, with other behavioral disturbance: Secondary | ICD-10-CM

## 2015-05-11 NOTE — Assessment & Plan Note (Signed)
SNF - not stated as uncontrolled ; cont aricept and namenda with depakote for behavoirs

## 2015-05-11 NOTE — Progress Notes (Addendum)
MRN: [id number] Name: [name]  Sex: female Age: 75 y.o. DOB: 01-Jan-1941  [facility] #: [facility] Facility/Room:111 Level Of Care: SNF Provider: Merrilee Seashore D Emergency Contacts: Extended Emergency Contact Information Primary Emergency Contact: Christinia Gully [place] Home Phone: [phone] Relation: Daughter  Code Status:   Allergies: Seroquel  Chief Complaint  Patient presents with  . New Admit To SNF    HPI: Patient is 75 y.o. female with history of CVA ( 05/2008), CAD, HTN, Hyperlipidemia and Dementia who presents to the ED with complaints of right sided numbness and confusion with symptoms that started around 5 pm. Her Daughter witnessed the event where she fell , and had increased weakness of her right side after falling. Pt was admitted to [facility] from 1/7-9 where neurology determined increased R side weakness was from UTI. SNF as recommended and pt is admitted to [facility] with generalized weakness and chronic R side weakness. At SNF pt will be followed for HTN, tx with lisinopril, HLD, tx with lipitor and CAD , tx with plavix and statin.  Past Medical History  Diagnosis Date  . Hypertension   . Hyperlipidemia   . Coronary artery disease 2000    STATUS POT [facility] [place] STENTING OF THE LAD   . CVA (cerebrovascular accident) (HCC) 2010    Past Surgical History  Procedure Laterality Date  . None        Medication List       This list is accurate as of: 05/11/15 11:59 PM.  Always use your most recent med list.               ARICEPT 10 MG tablet  Generic drug:  donepezil  TAKE 1 TABLET BY MOUTH EVERY DAY AT BEDTIME     cefpodoxime 200 MG tablet  Commonly known as:  VANTIN  Take 1 tablet (200 mg total) by mouth 2 (two) times daily.     clopidogrel 75 MG tablet  Commonly known as:  PLAVIX  TAKE 1 TABLET BY MOUTH EVERY DAY     divalproex 125 MG DR tablet  Commonly known as:  DEPAKOTE  TAKE 2 TABLETS EVERY DAY     LIPITOR 40 MG tablet  Generic drug:   atorvastatin  TAKE 1 TABLET BY MOUTH EVERY DAY     lisinopril 10 MG tablet  Commonly known as:  PRINIVIL,ZESTRIL  TAKE 1 TABLET EVERY DAY     NAMENDA XR TITRATION PACK 7 & 14 & 21 &28 MG Cp24  Generic drug:  Memantine HCl ER  Take 7 mg by mouth daily.     memantine 28 MG Cp24 24 hr capsule  Commonly known as:  NAMENDA XR  Take 28 mg by mouth daily.     nitroGLYCERIN 0.4 MG SL tablet  Commonly known as:  NITROSTAT  Place 1 tablet (0.4 mg total) under the tongue every 5 (five) minutes as needed for chest pain.        No orders of the defined types were placed in this encounter.     There is no immunization history on file for this patient.  Social History  Substance Use Topics  . Smoking status: Former Smoker    Quit date: 05/01/2008  . Smokeless tobacco: Never Used  . Alcohol Use: No    Family history is + alzheimers   Review of Systems  DATA OBTAINED: from patient, nurse GENERAL:  no fevers, fatigue, appetite changes SKIN: No itching, rash or wounds EYES: No eye pain,  redness, discharge EARS: No earache, tinnitus, change in hearing NOSE: No congestion, drainage or bleeding  MOUTH/THROAT: No mouth or tooth pain, No sore throat RESPIRATORY: No cough, wheezing, SOB CARDIAC: No chest pain, palpitations, lower extremity edema  GI: No abdominal pain, No N/V/D or constipation, No heartburn or reflux  GU: No dysuria, frequency or urgency, or incontinence  MUSCULOSKELETAL: No unrelieved bone/joint pain NEUROLOGIC: No headache, dizziness or focal weakness PSYCHIATRIC: No c/o anxiety or sadness   Filed Vitals:   05/11/15 1529  BP: 121/71  Pulse: 79  Temp: 98.1 F (36.7 C)  Resp: 18    SpO2 Readings from Last 1 Encounters:  05/10/15 98%        Physical Exam  GENERAL APPEARANCE: Alert, conversant,  No acute distress.  SKIN: No diaphoresis rash HEAD: Normocephalic, atraumatic  EYES: Conjunctiva/lids clear. Pupils round, reactive. EOMs intact.  EARS:  External exam WNL, canals clear. Hearing grossly normal.  NOSE: No deformity or discharge.  MOUTH/THROAT: Lips w/o lesions  RESPIRATORY: Breathing is even, unlabored. Lung sounds are clear   CARDIOVASCULAR: Heart RRR no murmurs, rubs or gallops. 1+ peripheral edema.   GASTROINTESTINAL: Abdomen is soft, non-tender, not distended w/ normal bowel sounds. GENITOURINARY: Bladder non tender, not distended  MUSCULOSKELETAL: No abnormal joints or musculature NEUROLOGIC:  Cranial nerves 2-12 grossly intact; R side paresis PSYCHIATRIC: Mood and affect appropriate to situation with dementisa, no behavioral issues  Patient Active Problem List   Diagnosis Date Noted  . Bradycardia 05/16/2015  . UTI (urinary tract infection) 05/11/2015  . 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 Stroke with cerebral ischemia (HCC)   . Weakness 05/08/2015  . TIA (transient ischemic attack) 05/08/2015  . Dementia with behavioral disturbance 11/20/2013  . Agitation 11/20/2013  . Hypertension   . Hyperlipidemia   . 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 Coronary artery disease     CBC    Component Value Date/Time   WBC 10.2 05/08/2015 2012   WBC 6.0 11/05/2013 1042   RBC 4.21 05/08/2015 2012   RBC 4.47 11/05/2013 1042   HGB 13.9 05/08/2015 2012   HCT 40.4 05/08/2015 2012   PLT 180 05/08/2015 2012   MCV 96.0 05/08/2015 2012   LYMPHSABS 1.9 05/08/2015 2012   MONOABS 0.6 05/08/2015 2012   EOSABS 0.2 05/08/2015 2012   BASOSABS 0.0 05/08/2015 2012    CMP     Component Value Date/Time   NA 143 05/08/2015 2012   NA 143 11/05/2013 1042   K 3.8 05/08/2015 2012   CL 109 05/08/2015 2012   CO2 24 05/08/2015 2012   GLUCOSE 110* 05/08/2015 2012   GLUCOSE 100* 11/05/2013 1042   BUN 15 05/08/2015 2012   BUN 14 11/05/2013 1042   CREATININE 0.64 05/08/2015 2012   CALCIUM 9.4 05/08/2015 2012   PROT 5.9* 05/08/2015 2012   PROT 6.4 11/05/2013 1042   ALBUMIN 3.7 05/08/2015 2012    ALBUMIN 4.4 11/05/2013 1042   AST 29 05/08/2015 2012   ALT 20 05/08/2015 2012   ALKPHOS 70 05/08/2015 2012   BILITOT 0.6 05/08/2015 2012   GFRNONAA >60 05/08/2015 2012   GFRAA >60 05/08/2015 2012    Lab Results  Component Value Date   HGBA1C 6.0* 05/09/2015     Ct Head Wo Contrast  05/08/2015  CLINICAL DATA:  Right-sided weakness and altered mental status. History of old infarct. EXAM: CT HEAD WITHOUT CONTRAST TECHNIQUE: Contiguous axial images were obtained from the base of the  skull through the vertex without intravenous contrast. COMPARISON:  Brain MRI, 12/12/2013.  Head CT, 06/20/2011. FINDINGS: Ventricles are normal configuration. There is ex vacuo dilation of the posterior left lateral ventricle due to an old left sided infarct that involves the majority of the left parietal lobe posterior left temporal lobe and portions of the left occipital lobe. This is stable from the prior exams. There is no hydrocephalus. There are no parenchymal masses or mass effect. There is no evidence of a recent cortical infarct. Other patchy areas of white matter hypoattenuation are noted consistent with moderate chronic microvascular ischemic change, also stable. There are no extra-axial masses or abnormal fluid collections. There is no intracranial hemorrhage. Visualized sinuses and mastoid air cells are clear. IMPRESSION: 1. No acute intracranial abnormalities. 2. Old left posterior MCA distribution infarct. 3. Moderate chronic microvascular ischemic change. 4. Stable appearance from the prior head CT and brain MRI. Electronically Signed   By: Amie Portland M.D.   On: 05/08/2015 20:23   Mr Maxine Glenn Head Wo Contrast  05/09/2015  CLINICAL DATA:  Larey Seat twice, worsening RIGHT-sided weakness. Unable to walk. History of hypertension, hyperlipidemia, stroke 2010 with residual mild RIGHT hemi paresis. EXAM: MRI HEAD WITHOUT CONTRAST MRA HEAD WITHOUT CONTRAST TECHNIQUE: Multiplanar, multiecho pulse sequences of the brain and  surrounding structures were obtained without intravenous contrast. Angiographic images of the head were obtained using MRA technique without contrast. COMPARISON:  CT head May 07, 2014 at 2016 hours and MRI of the brain December 12, 2013 and MRA head December 01, 2009 FINDINGS: MRI HEAD FINDINGS No reduced diffusion to suggest acute ischemia. LEFT temporal parietal occipital encephalomalacia with faint susceptibility artifact compatible with mineralization. Nonspecific punctate focus of susceptibility artifact RIGHT frontal lobe. Ex vacuo dilatation of LEFT occipital horn, ventricles and sulci are otherwise normal for patient's age. Patchy to confluent supratentorial white matter T2 hyperintensities stable from prior MRI. No midline shift, mass effect or mass lesions. Prominent perivascular spaces are unchanged. No abnormal extra-axial fluid collections. Ocular globes and orbital contents are normal. Trace paranasal sinus mucosal thickening without air-fluid levels. The mastoid air cells are well aerated. No abnormal sellar expansion. No cerebellar tonsillar ectopia. No suspicious calvarial bone marrow signal. Patient appears edentulous. MRA HEAD FINDINGS Anterior circulation: Normal flow related enhancement of the included cervical, petrous, cavernous and supraclinoid internal carotid arteries. 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 Stable appearance of 5 mm superiorly directed LEFT para ophthalmic aneurysm. Patent anterior communicating artery. Normal flow related enhancement of the anterior cerebral arteries, including distal segments. Paucity of mid to distal LEFT MCA branches compatible with old infarct. No large vessel occlusion, high-grade stenosis, abnormal luminal irregularity. Posterior circulation: RIGHT vertebral artery is dominant. Basilar artery is patent, with normal flow related enhancement of the main branch vessels. Normal flow related  enhancement of the posterior cerebral arteries. Fetal origin RIGHT posterior cerebral artery. No large vessel occlusion, high-grade stenosis, abnormal luminal irregularity, aneurysm. IMPRESSION: MRI HEAD: No acute intracranial process, specifically no acute ischemia. Old large LEFT MCA territory infarct. Moderate to severe chronic small vessel ischemic disease. MRA HEAD: No acute large vessel occlusion or high-grade stenosis. Paucity of mid to distal LEFT MCA branches compatible with old infarct. 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 Electronically Signed   By: Awilda Metro M.D.   On: 05/09/2015 01:02   Mr Brain Wo Contrast  05/09/2015  CLINICAL DATA:  Fell twice, worsening RIGHT-sided weakness. Unable to walk. History of hypertension, hyperlipidemia, stroke 2010 with residual mild RIGHT hemi paresis. EXAM: MRI HEAD WITHOUT CONTRAST MRA HEAD WITHOUT CONTRAST TECHNIQUE: Multiplanar, multiecho pulse sequences of the brain and surrounding structures were obtained without intravenous contrast. Angiographic images of the head were obtained using MRA technique without contrast. COMPARISON:  CT head May 07, 2014 at 2016 hours and MRI of the brain December 12, 2013 and MRA head December 01, 2009 FINDINGS: MRI HEAD FINDINGS No reduced diffusion to suggest acute ischemia. LEFT temporal parietal occipital encephalomalacia with faint susceptibility artifact compatible with mineralization. Nonspecific punctate focus of susceptibility artifact RIGHT frontal lobe. Ex vacuo dilatation of LEFT occipital horn, ventricles and sulci are otherwise normal for patient's age. Patchy to confluent supratentorial white matter T2 hyperintensities stable from prior MRI. No midline shift, mass effect or mass lesions. Prominent perivascular spaces are unchanged. No abnormal extra-axial fluid collections. Ocular globes and orbital contents are normal. Trace paranasal sinus mucosal  thickening without air-fluid levels. The mastoid air cells are well aerated. No abnormal sellar expansion. No cerebellar tonsillar ectopia. No suspicious calvarial bone marrow signal. Patient appears edentulous. MRA HEAD FINDINGS Anterior circulation: Normal flow related enhancement of the included cervical, petrous, cavernous and supraclinoid internal carotid arteries. 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 Stable appearance of 5 mm superiorly directed LEFT para ophthalmic aneurysm. Patent anterior communicating artery. Normal flow related enhancement of the anterior cerebral arteries, including distal segments. Paucity of mid to distal LEFT MCA branches compatible with old infarct. No large vessel occlusion, high-grade stenosis, abnormal luminal irregularity. Posterior circulation: RIGHT vertebral artery is dominant. Basilar artery is patent, with normal flow related enhancement of the main branch vessels. Normal flow related enhancement of the posterior cerebral arteries. Fetal origin RIGHT posterior cerebral artery. No large vessel occlusion, high-grade stenosis, abnormal luminal irregularity, aneurysm. IMPRESSION: MRI HEAD: No acute intracranial process, specifically no acute ischemia. Old large LEFT MCA territory infarct. Moderate to severe chronic small vessel ischemic disease. MRA HEAD: No acute large vessel occlusion or high-grade stenosis. Paucity of mid to distal LEFT MCA branches compatible with old infarct. 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 Electronically Signed   By: Awilda Metroourtnay  Bloomer M.D.   On: 05/09/2015 01:02    Not all labs, radiology exams or other studies done during hospitalization come through on my EPIC note; however they are reviewed by me.    Assessment and Plan  TIA (transient ischemic attack) Worsening of chronic right-sided hemiparesis. Likely brought on by UTI.  Seen by neurology, MRI nonacute, cleared by neurology from stroke standpoint. She is currently on Plavix and statin for secondary prevention for her previous left MCA territory stroke which will be continued. Unremarkable bilateral carotid duplex, go gram shows a preserved EF of 60% with chronic grade 1 diastolic dysfunction SNF - cont plavix and stating for secondary prevention.    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 Likely brought on by UTI. Seen by neurology, MRI nonacute, cleared by neurology from stroke standpoint. She is currently on Plavix and statin for secondary prevention for her previous left MCA territory stroke which will be continued. Unremarkable bilateral carotid duplex, go gram shows a preserved EF of 60% with chronic grade 1 diastolic dysfunction, SNF - continue plavix;OT/PT  Bradycardia SNF - during hospitalization, bblockers were stopped   Time spent > 45 min;> 50% of time with patient was spent reviewing records, labs, tests and studies, counseling and developing plan of care  Margit Hanks, MD

## 2015-05-11 NOTE — Assessment & Plan Note (Signed)
SNF - cont depakote 125 mg BID

## 2015-05-11 NOTE — Assessment & Plan Note (Signed)
Worsening of chronic right-sided hemiparesis. Likely brought on by UTI. Seen by neurology, MRI nonacute, cleared by neurology from stroke standpoint. She is currently on Plavix and statin for secondary prevention for her previous left MCA territory stroke which will be continued. Unremarkable bilateral carotid duplex, go gram shows a preserved EF of 60% with chronic grade 1 diastolic dysfunction SNF - cont plavix and stating for secondary prevention.

## 2015-05-11 NOTE — Assessment & Plan Note (Signed)
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

## 2015-05-11 NOTE — Assessment & Plan Note (Signed)
SNF - controlled on lisinopril 10 mg;plan - cont current med

## 2015-05-11 NOTE — Assessment & Plan Note (Signed)
SNF - on 40 mg lipitor LDL 66, HDL 61; chronic and stable

## 2015-05-11 NOTE — Assessment & Plan Note (Signed)
Place on emperic abx; SNF - cont vantin 5 more days

## 2015-05-11 NOTE — Assessment & Plan Note (Signed)
SNF - chron ic and stable; metoprolol stopped 2/2 bradycardia, cont plavix and statin

## 2015-05-16 ENCOUNTER — Encounter: Payer: Self-pay | Admitting: Internal Medicine

## 2015-05-16 DIAGNOSIS — R001 Bradycardia, unspecified: Secondary | ICD-10-CM | POA: Insufficient documentation

## 2015-05-16 NOTE — Assessment & Plan Note (Addendum)
Worsening of chronic right-sided hemiparesis. Likely brought on by UTI. Seen by neurology, MRI nonacute, cleared by neurology from stroke standpoint. She is currently on Plavix and statin for secondary prevention for her previous left MCA territory stroke which will be continued. Unremarkable bilateral carotid duplex, go gram shows a preserved EF of 60% with chronic grade 1 diastolic dysfunction, SNF - continue plavix;OT/PT

## 2015-05-16 NOTE — Assessment & Plan Note (Signed)
SNF - during hospitalization, bblockers were stopped

## 2015-05-17 ENCOUNTER — Ambulatory Visit (HOSPITAL_COMMUNITY): Admit: 2015-05-17 | Payer: Medicare Other

## 2015-05-17 ENCOUNTER — Other Ambulatory Visit (HOSPITAL_COMMUNITY): Payer: Self-pay | Admitting: Interventional Radiology

## 2015-05-17 ENCOUNTER — Ambulatory Visit (HOSPITAL_COMMUNITY): Admission: RE | Admit: 2015-05-17 | Payer: Medicare Other | Source: Ambulatory Visit

## 2015-05-17 DIAGNOSIS — I6389 Other cerebral infarction: Secondary | ICD-10-CM

## 2015-05-17 DIAGNOSIS — I671 Cerebral aneurysm, nonruptured: Secondary | ICD-10-CM

## 2015-05-21 ENCOUNTER — Telehealth: Payer: Self-pay | Admitting: Cardiovascular Disease

## 2015-05-21 ENCOUNTER — Observation Stay (HOSPITAL_COMMUNITY)
Admission: EM | Admit: 2015-05-21 | Discharge: 2015-05-24 | Disposition: A | Discharge status: Skilled Nursing Facility | Payer: Medicare Other | Attending: Internal Medicine | Admitting: Internal Medicine

## 2015-05-21 ENCOUNTER — Telehealth: Payer: Self-pay | Admitting: Internal Medicine

## 2015-05-21 ENCOUNTER — Ambulatory Visit (HOSPITAL_COMMUNITY)
Admission: RE | Admit: 2015-05-21 | Discharge: 2015-05-21 | Disposition: A | Discharge status: Home / Self Care | Payer: Medicare Other | Source: Ambulatory Visit | Attending: Interventional Radiology | Admitting: Interventional Radiology

## 2015-05-21 ENCOUNTER — Encounter (HOSPITAL_COMMUNITY): Payer: Self-pay | Admitting: Emergency Medicine

## 2015-05-21 DIAGNOSIS — I6389 Other cerebral infarction: Secondary | ICD-10-CM

## 2015-05-21 DIAGNOSIS — M25461 Effusion, right knee: Secondary | ICD-10-CM | POA: Insufficient documentation

## 2015-05-21 DIAGNOSIS — I10 Essential (primary) hypertension: Secondary | ICD-10-CM | POA: Diagnosis not present

## 2015-05-21 DIAGNOSIS — I728 Aneurysm of other specified arteries: Secondary | ICD-10-CM | POA: Insufficient documentation

## 2015-05-21 DIAGNOSIS — F03918 Unspecified dementia, unspecified severity, with other behavioral disturbance: Secondary | ICD-10-CM | POA: Diagnosis present

## 2015-05-21 DIAGNOSIS — E785 Hyperlipidemia, unspecified: Secondary | ICD-10-CM | POA: Insufficient documentation

## 2015-05-21 DIAGNOSIS — I671 Cerebral aneurysm, nonruptured: Secondary | ICD-10-CM

## 2015-05-21 DIAGNOSIS — Z955 Presence of coronary angioplasty implant and graft: Secondary | ICD-10-CM | POA: Insufficient documentation

## 2015-05-21 DIAGNOSIS — Z9181 History of falling: Secondary | ICD-10-CM | POA: Diagnosis not present

## 2015-05-21 DIAGNOSIS — I2510 Atherosclerotic heart disease of native coronary artery without angina pectoris: Secondary | ICD-10-CM | POA: Diagnosis not present

## 2015-05-21 DIAGNOSIS — I69320 Aphasia following cerebral infarction: Secondary | ICD-10-CM | POA: Diagnosis not present

## 2015-05-21 DIAGNOSIS — I82412 Acute embolism and thrombosis of left femoral vein: Secondary | ICD-10-CM | POA: Diagnosis not present

## 2015-05-21 DIAGNOSIS — F0391 Unspecified dementia with behavioral disturbance: Secondary | ICD-10-CM | POA: Insufficient documentation

## 2015-05-21 DIAGNOSIS — Z7901 Long term (current) use of anticoagulants: Secondary | ICD-10-CM | POA: Diagnosis not present

## 2015-05-21 DIAGNOSIS — Z888 Allergy status to other drugs, medicaments and biological substances status: Secondary | ICD-10-CM | POA: Insufficient documentation

## 2015-05-21 DIAGNOSIS — I82402 Acute embolism and thrombosis of unspecified deep veins of left lower extremity: Secondary | ICD-10-CM | POA: Diagnosis present

## 2015-05-21 DIAGNOSIS — M7989 Other specified soft tissue disorders: Secondary | ICD-10-CM | POA: Diagnosis present

## 2015-05-21 DIAGNOSIS — I69351 Hemiplegia and hemiparesis following cerebral infarction affecting right dominant side: Secondary | ICD-10-CM | POA: Insufficient documentation

## 2015-05-21 MED ORDER — ENOXAPARIN SODIUM 80 MG/0.8ML ~~LOC~~ SOLN
1.0000 mg/kg | Freq: Once | SUBCUTANEOUS | Status: AC
  Administered 2015-05-22: 65 mg via SUBCUTANEOUS
  Filled 2015-05-21: qty 0.8

## 2015-05-21 NOTE — Telephone Encounter (Signed)
Pt's dtr calling to cxl 1-27 appt-pt in short term rehab and doesn't know what her expected release date is-wants to know what to do in case refills are needed before she can come in-pls call -knows it will be next week

## 2015-05-21 NOTE — Telephone Encounter (Signed)
Called patient back. patient's daughter spoke to me. She was very concerned about the care of her mother at the facility. She said that her mother's leg is swollen and the MD found out that she has a DVT. She was started on rivaroxaban by the MD. however the patient's daughter is concerned that her mother might not be getting appropriate therapy. She was told that I can talk to the MD if need to be or alternatively, she can go to the ED and get her mother evaluated and be transferred to another facility. She said that she is already working on the transfer to another facility. She was told that in case she needs me, I am available via my pager. She will get her mother transferred to another facility and will call me back if needed.

## 2015-05-21 NOTE — ED Notes (Signed)
Per EMS, pt from [facility], pt had a leg doppler  today and a DVT was found in her left leg.  Pt does have a history of dementia as well

## 2015-05-21 NOTE — ED Provider Notes (Signed)
CSN: [id number]     Arrival date & time 05/21/15  2239 History   By signing my name below, I, Bethel Born, attest that this documentation has been prepared under the direction and in the presence of Azalia Bilis, MD. Electronically Signed: Bethel Born, ED Scribe. 05/22/2015. 2:30 AM   Chief Complaint  Patient presents with  . Leg Problem    The history is provided by the patient. No language interpreter was used.   Brought in by EMS from [facility], [name] is a 75 y.o. female with history of dementia, HTN, HLD, CAD, and CVA on Plavix who presents to the Emergency Department with her daughter complaining of constant left leg pain and swelling with onset today. The patient's daughter states that the pt was diagnosed with a DVT today but she is unsure of the Xarelto that was ordered has been given.  Her daughter reports that she was seen 2 weeks ago for pain and swelling in the right leg after a fall. The pt has no previous history of DVT but has not been ambulatory since falling 2 weeks ago due to ongoing right knee pain. Pt denies abdominal pain, chest pain, SOB, and back pain. Patient and daughter deny any PMHx of cancer.   Past Medical History  Diagnosis Date  . Hypertension   . Hyperlipidemia   . Coronary artery disease 2000    STATUS POT [facility] [place] STENTING OF THE LAD   . CVA (cerebrovascular accident) (HCC) 2010   Past Surgical History  Procedure Laterality Date  . None     Family History  Problem Relation Age of Onset  . Alzheimer's disease Mother    Social History  Substance Use Topics  . Smoking status: Former Smoker    Quit date: 05/01/2008  . Smokeless tobacco: Never Used  . Alcohol Use: No   OB History    No data available     Review of Systems 10 Systems reviewed and all are negative for acute change except as noted in the HPI. Allergies  Seroquel  Home Medications   Prior to Admission medications   Medication Sig Start Date End  Date Taking? Authorizing Provider  ARICEPT 10 MG tablet TAKE 1 TABLET BY MOUTH EVERY DAY AT BEDTIME 04/29/15   Micki Riley, MD  cefpodoxime (VANTIN) 200 MG tablet Take 1 tablet (200 mg total) by mouth 2 (two) times daily. 05/10/15   Leroy Sea, MD  clopidogrel (PLAVIX) 75 MG tablet TAKE 1 TABLET BY MOUTH EVERY DAY 03/10/15   Vesta Mixer, MD  divalproex (DEPAKOTE) 125 MG DR tablet TAKE 2 TABLETS EVERY DAY    Micki Riley, MD  LIPITOR 40 MG tablet TAKE 1 TABLET BY MOUTH EVERY DAY 05/10/15   Vesta Mixer, MD  lisinopril (PRINIVIL,ZESTRIL) 10 MG tablet TAKE 1 TABLET EVERY DAY 03/10/15   Vesta Mixer, MD  Memantine HCl ER 28 MG CP24 Take 28 mg by mouth daily. 03/09/14   Micki Riley, MD  NAMENDA XR TITRATION PACK 7 & 14 & 21 &28 MG CP24 Take 7 mg by mouth daily. 03/09/14   Micki Riley, MD  nitroGLYCERIN (NITROSTAT) 0.4 MG SL tablet Place 1 tablet (0.4 mg total) under the tongue every 5 (five) minutes as needed for chest pain. 03/12/14   Vesta Mixer, MD   BP 113/60 mmHg  Pulse 82  Temp(Src) 99.5 F (37.5 C) (Oral)  Resp 18  SpO2 94% Physical Exam  Constitutional:  She is oriented to person, place, and time. She appears well-developed and well-nourished. No distress.  HENT:  Head: Normocephalic and atraumatic.  Eyes: EOM are normal.  Neck: Normal range of motion.  Cardiovascular: Normal rate, regular rhythm and normal heart sounds.   Pulmonary/Chest: Effort normal and breath sounds normal.  Abdominal: Soft. She exhibits no distension. There is no tenderness.  Musculoskeletal:  Swelling of LLE as compared to the right. Dopplerable PT and DP pulses in the left foot. Palpable pulses in the right foot. Mild pain with ROM at right knee. Stigmata of recent bruising of right proximal tibia.   Neurological: She is alert and oriented to person, place, and time.  Skin: Skin is warm and dry.  Psychiatric: She has a normal mood and affect. Judgment normal.  Nursing note and vitals  reviewed.   ED Course  Procedures (including critical care time) DIAGNOSTIC STUDIES: Oxygen Saturation is 94% on RA,  normal by my interpretation.    COORDINATION OF CARE: 11:51 PM Discussed treatment plan which includes lab work, right knee XR, and Lovenox with the pt and her daughter at bedside and they agreed to the plan.  2:27 AM-Consult complete with Dr. Katrinka Blazing (Hospitalist). Patient case explained and discussed. Agrees to admit patient for further evaluation and treatment. Call ended at 2:30 AM   Labs Review Labs Reviewed - No data to display  Imaging Review No results found. I personally reviewed and evaluated these images and lab results as a part of my medical decision-making.   EKG Interpretation None      MDM     Clinically the patient has a DVT.  She'll be admitted to the hospital given the discoloration.  She may benefit from consultation by interventional radiology.  I personally performed the services described in this documentation, which was scribed in my presence. The recorded information has been reviewed and is accurate.       Azalia Bilis, MD 05/28/15 [phone]

## 2015-05-22 ENCOUNTER — Emergency Department (HOSPITAL_COMMUNITY): Payer: Medicare Other

## 2015-05-22 ENCOUNTER — Observation Stay (HOSPITAL_BASED_OUTPATIENT_CLINIC_OR_DEPARTMENT_OTHER)
Admit: 2015-05-22 | Discharge: 2015-05-22 | Disposition: A | Discharge status: Home / Self Care | Payer: Medicare Other | Attending: Internal Medicine | Admitting: Internal Medicine

## 2015-05-22 DIAGNOSIS — I82402 Acute embolism and thrombosis of unspecified deep veins of left lower extremity: Secondary | ICD-10-CM | POA: Diagnosis not present

## 2015-05-22 DIAGNOSIS — I671 Cerebral aneurysm, nonruptured: Secondary | ICD-10-CM

## 2015-05-22 DIAGNOSIS — I82412 Acute embolism and thrombosis of left femoral vein: Secondary | ICD-10-CM

## 2015-05-22 DIAGNOSIS — I10 Essential (primary) hypertension: Secondary | ICD-10-CM

## 2015-05-22 DIAGNOSIS — E785 Hyperlipidemia, unspecified: Secondary | ICD-10-CM | POA: Diagnosis not present

## 2015-05-22 DIAGNOSIS — F0391 Unspecified dementia with behavioral disturbance: Secondary | ICD-10-CM

## 2015-05-22 DIAGNOSIS — I25118 Atherosclerotic heart disease of native coronary artery with other forms of angina pectoris: Secondary | ICD-10-CM | POA: Diagnosis not present

## 2015-05-22 LAB — BASIC METABOLIC PANEL
Anion gap: 10 (ref 5–15)
BUN: 18 mg/dL (ref 6–20)
CALCIUM: 9 mg/dL (ref 8.9–10.3)
CHLORIDE: 103 mmol/L (ref 101–111)
CO2: 27 mmol/L (ref 22–32)
CREATININE: 0.63 mg/dL (ref 0.44–1.00)
Glucose, Bld: 110 mg/dL — ABNORMAL HIGH (ref 65–99)
Potassium: 4 mmol/L (ref 3.5–5.1)
SODIUM: 140 mmol/L (ref 135–145)

## 2015-05-22 LAB — CBC
HCT: 38.8 % (ref 36.0–46.0)
HEMOGLOBIN: 13.3 g/dL (ref 12.0–15.0)
MCH: 33 pg (ref 26.0–34.0)
MCHC: 34.3 g/dL (ref 30.0–36.0)
MCV: 96.3 fL (ref 78.0–100.0)
PLATELETS: 178 10*3/uL (ref 150–400)
RBC: 4.03 MIL/uL (ref 3.87–5.11)
RDW: 12.7 % (ref 11.5–15.5)
WBC: 9.8 10*3/uL (ref 4.0–10.5)

## 2015-05-22 MED ORDER — MAGNESIUM HYDROXIDE 400 MG/5ML PO SUSP: 30.0000 mL | Freq: Every day | ORAL | Status: DC | PRN

## 2015-05-22 MED ORDER — APIXABAN 5 MG PO TABS
10.0000 mg | ORAL_TABLET | Freq: Two times a day (BID) | ORAL | Status: DC
  Administered 2015-05-22 – 2015-05-24 (×4): 10 mg via ORAL
  Filled 2015-05-22 (×5): qty 2

## 2015-05-22 MED ORDER — FLEET ENEMA 7-19 GM/118ML RE ENEM: 1.0000 | ENEMA | Freq: Every day | RECTAL | Status: DC | PRN

## 2015-05-22 MED ORDER — CEFPODOXIME PROXETIL 200 MG PO TABS: 200.0000 mg | ORAL_TABLET | Freq: Two times a day (BID) | ORAL | Status: DC

## 2015-05-22 MED ORDER — SODIUM CHLORIDE 0.9 % IJ SOLN
3.0000 mL | Freq: Two times a day (BID) | INTRAMUSCULAR | Status: DC
Start: 2015-05-22 — End: 2015-05-25
  Administered 2015-05-22 – 2015-05-23 (×2): 3 mL via INTRAVENOUS

## 2015-05-22 MED ORDER — ENOXAPARIN SODIUM 80 MG/0.8ML ~~LOC~~ SOLN
65.0000 mg | Freq: Two times a day (BID) | SUBCUTANEOUS | Status: DC
  Administered 2015-05-22: 65 mg via SUBCUTANEOUS
  Filled 2015-05-22 (×3): qty 0.8

## 2015-05-22 MED ORDER — NITROGLYCERIN 0.4 MG SL SUBL: 0.4000 mg | SUBLINGUAL_TABLET | SUBLINGUAL | Status: DC | PRN

## 2015-05-22 MED ORDER — CLOPIDOGREL BISULFATE 75 MG PO TABS
75.0000 mg | ORAL_TABLET | Freq: Every day | ORAL | Status: DC
  Administered 2015-05-22 – 2015-05-24 (×3): 75 mg via ORAL
  Filled 2015-05-22 (×3): qty 1

## 2015-05-22 MED ORDER — LISINOPRIL 10 MG PO TABS
10.0000 mg | ORAL_TABLET | Freq: Every day | ORAL | Status: DC
  Administered 2015-05-22 – 2015-05-24 (×3): 10 mg via ORAL
  Filled 2015-05-22 (×3): qty 1

## 2015-05-22 MED ORDER — DONEPEZIL HCL 10 MG PO TABS
10.0000 mg | ORAL_TABLET | Freq: Every day | ORAL | Status: DC
  Administered 2015-05-22 – 2015-05-23 (×2): 10 mg via ORAL
  Filled 2015-05-22 (×2): qty 1

## 2015-05-22 MED ORDER — ACETAMINOPHEN 325 MG PO TABS: 650.0000 mg | ORAL_TABLET | Freq: Four times a day (QID) | ORAL | Status: DC | PRN

## 2015-05-22 MED ORDER — BISACODYL 10 MG RE SUPP: 10.0000 mg | Freq: Every day | RECTAL | Status: DC | PRN

## 2015-05-22 MED ORDER — ATORVASTATIN CALCIUM 40 MG PO TABS
40.0000 mg | ORAL_TABLET | Freq: Every day | ORAL | Status: DC
  Administered 2015-05-22 – 2015-05-24 (×3): 40 mg via ORAL
  Filled 2015-05-22 (×2): qty 1
  Filled 2015-05-22: qty 4

## 2015-05-22 MED ORDER — APIXABAN 5 MG PO TABS: 5.0000 mg | ORAL_TABLET | Freq: Two times a day (BID) | ORAL | Status: DC

## 2015-05-22 NOTE — H&P (Signed)
[facility] History and Physical  [name] FOY:[id number] DOB: Feb 25, 1941 DOA: 05/21/2015  Referring physician: ED PCP: Leanor Rubenstein, MD   Chief Complaint: Left leg swelling  HPI:   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 Patient's daughter provides most of her history and states that she was diagnosed with a DVT today,  but is unsure if Xarelto was given. She states that they had her mother going to physical rehab classes. Daughter notes that the patient fell approximately 2 weeks ago and has had pain and swelling in the right leg. Her mother has been having difficulty with bearing weight on the right leg and has not been very ambulatory since the fall. She thinks this likely provoking her having the clot in the left lower extremity. Patient denies any shortness of breath, chest pain, palpitations, abdominal pain, nausea, vomiting, fever, or chills.  Patient's daughter also makes it known that she is not currently on Depakote and that she is supposed to get brand name Lipitor and Aricept. 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 X-rays obtained of the right knee showed signs of osteoarthritis, but no acute fractures.   Review of Systems: negative for the following  Constitutional: Denies fever, chills, diaphoresis, appetite change and fatigue.  HEENT: Denies photophobia, eye pain, redness, hearing loss, ear pain, congestion, sore throat, rhinorrhea, sneezing, mouth sores, trouble swallowing, neck pain, neck stiffness and tinnitus.  Respiratory: Denies SOB, DOE, cough, chest tightness, and wheezing.  Cardiovascular: Denies chest pain or  palpitations. Gastrointestinal: Denies nausea, vomiting, abdominal pain, diarrhea, constipation, blood in stool and abdominal distention.  Genitourinary: Denies dysuria, urgency, frequency, hematuria, flank pain and difficulty urinating.  Musculoskeletal: Denies myalgias or back pain.  Skin: Denies pallor, rash and wound.  Neurological: Denies dizziness, seizures, syncope, weakness, light-headedness, numbness and headaches.  Hematological: Denies adenopathy. Easy bruising, personal or family bleeding history  Psychiatric/Behavioral: Denies suicidal ideation      Past Medical History  Diagnosis Date  . Hypertension   . Hyperlipidemia   . Coronary artery disease 2000    STATUS POT [facility] [place] STENTING OF THE LAD   . CVA (cerebrovascular accident) (HCC) 2010     Past Surgical History  Procedure Laterality Date  . None        Social History:  reports that she quit smoking about 7 years ago. She has never used smokeless tobacco. She reports that she does not drink alcohol or use illicit drugs. Where does patient live--SNF Can patient participate in ADLs?   Allergies  Allergen Reactions  . Seroquel [Quetiapine Fumarate] Other (See Comments)    Next day after taking medication, patient was hard to wake up from her sleep    Family History  Problem Relation Age of Onset  . Alzheimer's disease Mother        Prior to Admission medications   Medication Sig Start Date End Date Taking? Authorizing Provider  ARICEPT 10 MG tablet TAKE 1 TABLET BY MOUTH EVERY DAY AT BEDTIME 04/29/15  Yes Micki Riley, MD  bisacodyl (DULCOLAX) 10 MG suppository Place 10 mg rectally daily as needed for moderate constipation.   Yes Historical Provider, MD  clopidogrel (PLAVIX) 75 MG tablet TAKE 1  TABLET BY MOUTH EVERY DAY 03/10/15  Yes Vesta Mixer, MD  divalproex (DEPAKOTE) 125 MG DR tablet Take 250 mg by mouth every morning.   Yes Historical Provider, MD  LIPITOR 40 MG tablet TAKE 1 TABLET BY MOUTH  EVERY DAY 05/10/15  Yes Vesta Mixer, MD  lisinopril (PRINIVIL,ZESTRIL) 10 MG tablet TAKE 1 TABLET EVERY DAY 03/10/15  Yes Vesta Mixer, MD  magnesium hydroxide (MILK OF MAGNESIA) 400 MG/5ML suspension Take 30 mLs by mouth daily as needed for mild constipation.   Yes Historical Provider, MD  nitroGLYCERIN (NITROSTAT) 0.4 MG SL tablet Place 1 tablet (0.4 mg total) under the tongue every 5 (five) minutes as needed for chest pain. 03/12/14  Yes Vesta Mixer, MD  Sodium Phosphates (RA SALINE ENEMA RE) Place 1 application rectally daily as needed (constipation).   Yes Historical Provider, MD     Physical Exam: Filed Vitals:   05/22/15 0029 05/22/15 0031 05/22/15 0130 05/22/15 0400  BP: 112/60 115/83 112/58 122/57  Pulse: 85 90 84 47  Temp:      TempSrc:      Resp:      SpO2: 96% 96% 95% 95%     Constitutional: Vital signs reviewed. Patient is a well-developed and well-nourished in no acute distress and cooperative with exam. Alert and oriented x3.  Head: Normocephalic and atraumatic  Ear: TM normal bilaterally  Mouth: no erythema or exudates, MMM  Eyes: PERRL, EOMI, conjunctivae normal, No scleral icterus.  Neck: Supple, Trachea midline normal ROM, No JVD, mass, thyromegaly, or carotid bruit present.  Cardiovascular: RRR, S1 normal, S2 normal, no MRG, pulses symmetric and intact bilaterally  Pulmonary/Chest: CTAB, no wheezes, rales, or rhonchi  Abdominal: Soft. Non-tender, non-distended, bowel sounds are normal, no masses, organomegaly, or guarding present.  GU: no CVA tenderness Musculoskeletal: No joint deformities, erythema, or stiffness, decreased range of motion of the right and left legs. Right knee tender to palpation positive crepitation noted.  Ext: 2+ pitting edema of the left lower extremity.  Hematology: no cervical, inginal, or axillary adenopathy.  Neurological: A&O x3, Strenght is normal and symmetric bilaterally. Patient with expressive  Aphasia. Skin: Warm, dry and  intact. No rash, cyanosis, or clubbing.  Psychiatric: Abnormal memory and cognition.    Data Review   Micro Results No results found for this or any previous visit (from the past 240 hour(s)).  Radiology Reports Ct Head Wo Contrast  05/08/2015  CLINICAL DATA:  Right-sided weakness and altered mental status. History of old infarct. EXAM: CT HEAD WITHOUT CONTRAST TECHNIQUE: Contiguous axial images were obtained from the base of the skull through the vertex without intravenous contrast. COMPARISON:  Brain MRI, 12/12/2013.  Head CT, 06/20/2011. FINDINGS: Ventricles are normal configuration. There is ex vacuo dilation of the posterior left lateral ventricle due to an old left sided infarct that involves the majority of the left parietal lobe posterior left temporal lobe and portions of the left occipital lobe. This is stable from the prior exams. There is no hydrocephalus. There are no parenchymal masses or mass effect. There is no evidence of a recent cortical infarct. Other patchy areas of white matter hypoattenuation are noted consistent with moderate chronic microvascular ischemic change, also stable. There are no extra-axial masses or abnormal fluid collections. There is no intracranial hemorrhage. Visualized sinuses and mastoid air cells are clear. IMPRESSION: 1. No acute intracranial abnormalities. 2. Old left posterior MCA distribution infarct. 3. Moderate chronic microvascular ischemic change. 4. Stable appearance from the  prior head CT and brain MRI. Electronically Signed   By: Amie Portland M.D.   On: 05/08/2015 20:23   Mr Maxine Glenn Head Wo Contrast  05/09/2015  CLINICAL DATA:  Larey Seat twice, worsening RIGHT-sided weakness. Unable to walk. History of hypertension, hyperlipidemia, stroke 2010 with residual mild RIGHT hemi paresis. EXAM: MRI HEAD WITHOUT CONTRAST MRA HEAD WITHOUT CONTRAST TECHNIQUE: Multiplanar, multiecho pulse sequences of the brain and surrounding structures were obtained without  intravenous contrast. Angiographic images of the head were obtained using MRA technique without contrast. COMPARISON:  CT head May 07, 2014 at 2016 hours and MRI of the brain December 12, 2013 and MRA head December 01, 2009 FINDINGS: MRI HEAD FINDINGS No reduced diffusion to suggest acute ischemia. LEFT temporal parietal occipital encephalomalacia with faint susceptibility artifact compatible with mineralization. Nonspecific punctate focus of susceptibility artifact RIGHT frontal lobe. Ex vacuo dilatation of LEFT occipital horn, ventricles and sulci are otherwise normal for patient's age. Patchy to confluent supratentorial white matter T2 hyperintensities stable from prior MRI. No midline shift, mass effect or mass lesions. Prominent perivascular spaces are unchanged. No abnormal extra-axial fluid collections. Ocular globes and orbital contents are normal. Trace paranasal sinus mucosal thickening without air-fluid levels. The mastoid air cells are well aerated. No abnormal sellar expansion. No cerebellar tonsillar ectopia. No suspicious calvarial bone marrow signal. Patient appears edentulous. MRA HEAD FINDINGS Anterior circulation: Normal flow related enhancement of the included cervical, petrous, cavernous and supraclinoid internal carotid arteries. 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 Stable appearance of 5 mm superiorly directed LEFT para ophthalmic aneurysm. Patent anterior communicating artery. Normal flow related enhancement of the anterior cerebral arteries, including distal segments. Paucity of mid to distal LEFT MCA branches compatible with old infarct. No large vessel occlusion, high-grade stenosis, abnormal luminal irregularity. Posterior circulation: RIGHT vertebral artery is dominant. Basilar artery is patent, with normal flow related enhancement of the main branch vessels. Normal flow related enhancement of the posterior cerebral arteries. Fetal  origin RIGHT posterior cerebral artery. No large vessel occlusion, high-grade stenosis, abnormal luminal irregularity, aneurysm. IMPRESSION: MRI HEAD: No acute intracranial process, specifically no acute ischemia. Old large LEFT MCA territory infarct. Moderate to severe chronic small vessel ischemic disease. MRA HEAD: No acute large vessel occlusion or high-grade stenosis. Paucity of mid to distal LEFT MCA branches compatible with old infarct. 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 Electronically Signed   By: Awilda Metro M.D.   On: 05/09/2015 01:02   Mr Brain Wo Contrast  05/09/2015  CLINICAL DATA:  Larey Seat twice, worsening RIGHT-sided weakness. Unable to walk. History of hypertension, hyperlipidemia, stroke 2010 with residual mild RIGHT hemi paresis. EXAM: MRI HEAD WITHOUT CONTRAST MRA HEAD WITHOUT CONTRAST TECHNIQUE: Multiplanar, multiecho pulse sequences of the brain and surrounding structures were obtained without intravenous contrast. Angiographic images of the head were obtained using MRA technique without contrast. COMPARISON:  CT head May 07, 2014 at 2016 hours and MRI of the brain December 12, 2013 and MRA head December 01, 2009 FINDINGS: MRI HEAD FINDINGS No reduced diffusion to suggest acute ischemia. LEFT temporal parietal occipital encephalomalacia with faint susceptibility artifact compatible with mineralization. Nonspecific punctate focus of susceptibility artifact RIGHT frontal lobe. Ex vacuo dilatation of LEFT occipital horn, ventricles and sulci are otherwise normal for patient's age. Patchy to confluent supratentorial white matter T2 hyperintensities stable from prior MRI. No midline shift, mass effect or mass lesions. Prominent  perivascular spaces are unchanged. No abnormal extra-axial fluid collections. Ocular globes and orbital contents are normal. Trace paranasal sinus mucosal thickening without air-fluid levels. The mastoid air  cells are well aerated. No abnormal sellar expansion. No cerebellar tonsillar ectopia. No suspicious calvarial bone marrow signal. Patient appears edentulous. MRA HEAD FINDINGS Anterior circulation: Normal flow related enhancement of the included cervical, petrous, cavernous and supraclinoid internal carotid arteries. 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 Stable appearance of 5 mm superiorly directed LEFT para ophthalmic aneurysm. Patent anterior communicating artery. Normal flow related enhancement of the anterior cerebral arteries, including distal segments. Paucity of mid to distal LEFT MCA branches compatible with old infarct. No large vessel occlusion, high-grade stenosis, abnormal luminal irregularity. Posterior circulation: RIGHT vertebral artery is dominant. Basilar artery is patent, with normal flow related enhancement of the main branch vessels. Normal flow related enhancement of the posterior cerebral arteries. Fetal origin RIGHT posterior cerebral artery. No large vessel occlusion, high-grade stenosis, abnormal luminal irregularity, aneurysm. IMPRESSION: MRI HEAD: No acute intracranial process, specifically no acute ischemia. Old large LEFT MCA territory infarct. Moderate to severe chronic small vessel ischemic disease. MRA HEAD: No acute large vessel occlusion or high-grade stenosis. Paucity of mid to distal LEFT MCA branches compatible with old infarct. 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 Electronically Signed   By: Awilda Metro M.D.   On: 05/09/2015 01:02   Dg Knee Complete 4 Views Right  05/22/2015  CLINICAL DATA:  Acute onset of left knee pain and bruising. Initial encounter. EXAM: RIGHT KNEE - COMPLETE 4+ VIEW COMPARISON:  None. FINDINGS: There is no evidence of fracture or dislocation. The joint spaces are preserved. Marginal osteophytes are seen arising at the medial  compartment, and mild osteophyte formation is noted at the tibial spine. A small knee joint effusion is noted. The visualized soft tissues are otherwise unremarkable in appearance. IMPRESSION: 1. No evidence of fracture or dislocation. 2. 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 3. Small knee joint effusion noted. Electronically Signed   By: Roanna Raider M.D.   On: 05/22/2015 01:01   Dg Foot Complete Right  05/10/2015  CLINICAL DATA:  Right foot pain. Possible fall 3-4 days ago. Midfoot bruising changes. EXAM: RIGHT FOOT COMPLETE - 3+ VIEW COMPARISON:  None. FINDINGS: Moderate midfoot degenerative changes with possible erosive findings. No acute fracture is identified. Hallux valgus deformity and degenerative change at the first metatarsal phalangeal joint. IMPRESSION: Midfoot degenerative changes but no acute fracture. Electronically Signed   By: Rudie Meyer M.D.   On: 05/10/2015 09:44     CBC  Recent Labs Lab 05/22/15 0006  WBC 9.8  HGB 13.3  HCT 38.8  PLT 178  MCV 96.3  MCH 33.0  MCHC 34.3  RDW 12.7    Chemistries   Recent Labs Lab 05/22/15 0006  NA 140  K 4.0  CL 103  CO2 27  GLUCOSE 110*  BUN 18  CREATININE 0.63  CALCIUM 9.0   ------------------------------------------------------------------------------------------------------------------ estimated creatinine clearance is 55 mL/min (by C-G formula based on Cr of 0.63). ------------------------------------------------------------------------------------------------------------------ No results for input(s): HGBA1C in the last 72 hours. ------------------------------------------------------------------------------------------------------------------ No results for input(s): CHOL, HDL, LDLCALC, TRIG, CHOLHDL, LDLDIRECT in the last 72 hours. ------------------------------------------------------------------------------------------------------------------ No results for input(s): TSH, T4TOTAL, T3FREE, THYROIDAB in  the last 72 hours.  Invalid input(s): FREET3 ------------------------------------------------------------------------------------------------------------------ No results for input(s): VITAMINB12, FOLATE, FERRITIN, TIBC, IRON, RETICCTPCT in  the last 72 hours.  Coagulation profile No results for input(s): INR, PROTIME in the last 168 hours.  No results for input(s): DDIMER in the last 72 hours.  Cardiac Enzymes No results for input(s): CKMB, TROPONINI, MYOGLOBIN in the last 168 hours.  Invalid input(s): CK ------------------------------------------------------------------------------------------------------------------ Invalid input(s): POCBNP   CBG: No results for input(s): GLUCAP in the last 168 hours.        Assessment/Plan Active Problems: Left leg DVT: Acute. Reportedly diagnosed at the nursing facility, but no records present. Left leg is twice size of right physical exam.  - admit to a telemetry bed  - Lovenox per pharmacy  - Duplex Doppler ultrasound in a.m.  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 - IR consult eval and management consult as well as possible need of direct thrombolysis for DVT  Hypertension  -Continue lisinopril   History of CVA - Continue Plavix  Coronary artery disease: Stable   Dementia - Continue Aricept brand name only  Hyperlipidemia - Continue Lipitor brand name only-    Code Status:   full Family Communication: bedside Disposition Plan: admit   Total time spent 55 minutes.Greater than 50% of this time was spent in counseling, explanation of diagnosis, planning of further management, and coordination of care  Clydie Braun [facility] Pager [phone]  If 7PM-7AM,  please contact night-coverage [email or web address] Password [facility] 05/22/2015, 4:54 AM

## 2015-05-22 NOTE — ED Notes (Signed)
Patient transported to X-ray 

## 2015-05-22 NOTE — Progress Notes (Signed)
Took report from E.D. Nurse at 1.51pm.

## 2015-05-22 NOTE — ED Notes (Addendum)
HH Lunch tray ordered for pt.

## 2015-05-22 NOTE — Progress Notes (Signed)
ANTICOAGULATION CONSULT NOTE - Follow Up Consult  Pharmacy Consult for Lovenox to Eliquis Indication: DVT  Allergies  Allergen Reactions  . Seroquel [Quetiapine Fumarate] Other (See Comments)    Next day after taking medication, patient was hard to wake up from her sleep    Patient Measurements: TBW 65 kg  Vital Signs: Temp: 98.9 F (37.2 C) (01/21 1045) Temp Source: Oral (01/21 0804) BP: 101/50 mmHg (01/21 1330) Pulse Rate: 73 (01/21 1330)  Labs:  Recent Labs  05/22/15 0006  HGB 13.3  HCT 38.8  PLT 178  CREATININE 0.63    Estimated Creatinine Clearance: 55 mL/min (by C-G formula based on Cr of 0.63).   Assessment: Venous duplex positive for DVT left common femoral vein and left proximal femoral vein. Started on Lovenox but now to transition to Eliquis. Last dose of Lovenox was at 1200. SCr stable, weight 65 kg. CBC stable, no s/s of bleed.  Goal of Therapy:  Monitor platelets by anticoagulation protocol: Yes   Plan:  Stop Lovenox Start Eliquis  PO BID x 7 days tonight, followed by  PO BID Monitor CBC, s/s of bleed  Enzo Bi, PharmD, BCPS Clinical Pharmacist Pager [phone] 05/22/2015 2:15 PM

## 2015-05-22 NOTE — Progress Notes (Signed)
CSW received consult that patient is from [facility]. CSW attempted to get in contact with patient's daughter at [phone], however received no answer. Unable to leave voice message due to mailbox being full. CSW will attempt to follow up and assess patient at another time. FL2 complete for MD signature.   Fernande Boyden, LCSWA Clinical Social Worker [facility] Ph: [phone]

## 2015-05-22 NOTE — NC FL2 (Signed)
[place] MEDICAID FL2 LEVEL OF CARE SCREENING TOOL     IDENTIFICATION  Patient Name: [name] Birthdate: [birth year] Sex: female Admission Date (Current Location): 05/21/2015  [facility] and [place] Number:  Producer, television/film/video and Address:  [facility], [address] [place], [place] [address]      Provider Number: [id number]  Attending Physician Name and Address:  Catarina Hartshorn, MD  Relative Name and Phone Number:       Current Level of Care: Hospital Recommended Level of Care: Skilled Nursing Facility Prior Approval Number:    Date Approved/Denied: 05/10/15 PASRR Number: [id number]  Discharge Plan: SNF    Current Diagnoses: Patient Active Problem List   Diagnosis Date Noted  . DVT (deep venous thrombosis), left 05/22/2015  . Left leg DVT (HCC) 05/22/2015  . Bradycardia 05/16/2015  . UTI (urinary tract infection) 05/11/2015  . 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 Stroke with cerebral ischemia (HCC)   . Weakness 05/08/2015  . TIA (transient ischemic attack) 05/08/2015  . Dementia with behavioral disturbance 11/20/2013  . Agitation 11/20/2013  . Hypertension   . Hyperlipidemia   . 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 Coronary artery disease     Orientation RESPIRATION BLADDER Height & Weight    Self, Time  Normal Incontinent  (160 cm) 142 lbs.  BEHAVIORAL SYMPTOMS/MOOD NEUROLOGICAL BOWEL NUTRITION STATUS      Incontinent Diet  AMBULATORY STATUS COMMUNICATION OF NEEDS Skin   Extensive Assist Verbally Normal                       Personal Care Assistance Level of Assistance  Bathing, Feeding, Dressing Bathing Assistance: Limited assistance Feeding assistance: Independent Dressing Assistance: Limited assistance     Functional Limitations Info  Sight, Hearing, Speech Sight Info: Adequate Hearing Info: Adequate Speech Info: Adequate    SPECIAL CARE FACTORS FREQUENCY  PT (By  licensed PT), OT (By licensed OT)                    Contractures Contractures Info: Not present    Additional Factors Info  Code Status, Allergies Code Status Info: Full code Allergies Info: Seroquel           Current Medications (05/22/2015):  This is the current hospital active medication list Current Facility-Administered Medications  Medication Dose Route Frequency Provider Last Rate Last Dose  . acetaminophen (TYLENOL) tablet 650 mg  650 mg Oral Q6H PRN Clydie Braun, MD      . apixaban (ELIQUIS) tablet 10 mg  10 mg Oral BID Armandina Stammer, RPH       Followed by  . [START ON 05/29/2015] apixaban (ELIQUIS) tablet 5 mg  5 mg Oral BID Armandina Stammer, RPH      . atorvastatin (LIPITOR) tablet 40 mg  40 mg Oral Daily Clydie Braun, MD   40 mg at 05/22/15 1058  . bisacodyl (DULCOLAX) suppository 10 mg  10 mg Rectal Daily PRN Clydie Braun, MD      . clopidogrel (PLAVIX) tablet 75 mg  75 mg Oral Daily Clydie Braun, MD   75 mg at 05/22/15 1058  . donepezil (ARICEPT) tablet 10 mg  10 mg Oral QHS Rondell A Smith, MD      . lisinopril (PRINIVIL,ZESTRIL) tablet 10 mg  10 mg Oral Daily Clydie Braun, MD   10 mg at 05/22/15 1100  .  magnesium hydroxide (MILK OF MAGNESIA) suspension 30 mL  30 mL Oral Daily PRN Clydie Braun, MD      . nitroGLYCERIN (NITROSTAT) SL tablet 0.4 mg  0.4 mg Sublingual Q5 min PRN Rondell A Katrinka Blazing, MD      . sodium chloride 0.9 % injection 3 mL  3 mL Intravenous Q12H Rondell Burtis Junes, MD   3 mL at 05/22/15 1003  . sodium phosphate (FLEET) 7-19 GM/118ML enema 1 enema  1 enema Rectal Daily PRN Clydie Braun, MD         Discharge Medications: Please see discharge summary for a list of discharge medications.  Relevant Imaging Results:  Relevant Lab Results:   Additional Information SSN [id number]  Stacey Dicker, LCSW

## 2015-05-22 NOTE — Progress Notes (Signed)
VASCULAR LAB PRELIMINARY  PRELIMINARY  PRELIMINARY  PRELIMINARY  Left lower extremity venous duplex completed.    Preliminary report:  There is acute DVT noted in the saphenofemoral junction, common femoral, and proximal femoral veins of the left lower extremity. No propagation noted to the right lower extremity.  Franchon Ketterman, RVT 05/22/2015, 7:33 AM

## 2015-05-22 NOTE — Progress Notes (Signed)
ANTICOAGULATION CONSULT NOTE - Initial Consult  Pharmacy Consult for Lovenox Indication: DVT  Allergies  Allergen Reactions  . Seroquel [Quetiapine Fumarate] Other (See Comments)    Next day after taking medication, patient was hard to wake up from her sleep    Patient Measurements:    Vital Signs: Temp: 99.5 F (37.5 C) (01/20 2250) Temp Source: Oral (01/20 2250) BP: 128/53 mmHg (01/21 0500) Pulse Rate: 84 (01/21 0500)  Labs:  Recent Labs  05/22/15 0006  HGB 13.3  HCT 38.8  PLT 178  CREATININE 0.63    Estimated Creatinine Clearance: 55 mL/min (by C-G formula based on Cr of 0.63).   Medical History: Past Medical History  Diagnosis Date  . Hypertension   . Hyperlipidemia   . Coronary artery disease 2000    STATUS POT PTCA ND STENTING OF THE LAD   . CVA (cerebrovascular accident) (HCC) 2010    Medications:  Aricept  Plavix  Depakote  Lipitor  Zestril  Ntg    Assessment: 75 y.o. female with LLE DVT for Lovenox.  Lovenox 65 mg SQ given in ED at midnight  Goal of Therapy:  Full anticoagulation with Lovenox Monitor platelets by anticoagulation protocol: Yes   Plan:  Lovenox 65 mg SQ q12h  Eddie Candle 05/22/2015,5:13 AM

## 2015-05-22 NOTE — Progress Notes (Signed)
PROGRESS NOTE  [name] WUJ:[id number] DOB: 1940/11/20 DOA: 05/21/2015 PCP: LeanAllis Quirartetein, MD Brief History 75 year old female with a history of stroke, dementia, hypertension, hyperlipidemia, coronary artery disease status post stenting presented with left lower extremity pain and edema. Patient is unable to provide any history at this time secondary to her underlying dementia. All of this history is obtained from speaking with the patient's daughter and review of the medical record. Apparently, the patient had mechanical fall at rehabilitation 2 weeks prior to admission resulting in pain and swelling in the right leg and difficulty bearing weight on the right leg. Since then, the patient has not been very ambulatory.  Patient denies fevers, chills, headache, chest pain, dyspnea, nausea, vomiting, diarrhea, abdominal pain, dysuria, hematuria. Patient was recently discharged from [facility] on 05/10/2015 to [facility]. During that admission, the patient was seen by neurology for worsening of her Right hemiparesis. MRI of the brain was negative for acute stroke. It was thought that the patient's unit tract infection likely contributed to the patient's worsening of her chronic right-sided hemiparesis. 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 Corliss Skains [phone]) spoke with pt's daughter 05/21/15 regarding aneurysm treatment and family will discuss matter further before deciding on case Right Knee Pain -05/22/15 xray--minimal OA, small effusion Essential Hypertension -continue  lisionpril -controlled History of Stroke -continue Plavix Hyperlipidemia -continue apixaban Dementia with behavioral disturbance -continue Aricept CAD with hx of stent -EKG without concerning ischemic changes -no anginal symptoms -continue plavix  Family Communication:   Pt at beside Disposition Plan:   Home when medically stable      Procedures/Studies: Ct Head Wo Contrast  05/08/2015  CLINICAL DATA:  Right-sided weakness and altered mental status. History of old infarct. EXAM: CT HEAD WITHOUT CONTRAST TECHNIQUE: Contiguous axial images were obtained from the base of the skull through the vertex without intravenous contrast. COMPARISON:  Brain MRI, 12/12/2013.  Head CT, 06/20/2011. FINDINGS: Ventricles are normal configuration. There is ex vacuo dilation of the posterior left lateral ventricle due to an old left sided infarct that involves the majority of the left parietal lobe posterior left temporal lobe and portions of the left occipital lobe. This is stable from the prior exams. There is no hydrocephalus. There are no parenchymal masses or mass effect. There is no evidence of a recent cortical infarct. Other patchy areas of white matter hypoattenuation are noted consistent with moderate chronic microvascular ischemic change, also stable. There are no extra-axial masses or abnormal fluid collections. There is no intracranial hemorrhage. Visualized sinuses and mastoid air cells are clear. IMPRESSION: 1. No acute intracranial abnormalities. 2. Old left posterior MCA distribution infarct. 3. Moderate chronic microvascular ischemic change. 4. Stable appearance from the prior head CT and brain MRI. Electronically Signed   By: Amie Portland M.D.   On: 05/08/2015 20:23   Mr Maxine Glenn Head Wo Contrast  05/09/2015  CLINICAL DATA:  Larey Seat twice, worsening RIGHT-sided weakness. Unable to walk. History of hypertension, hyperlipidemia, stroke 2010 with residual mild RIGHT hemi paresis. EXAM: MRI HEAD WITHOUT  CONTRAST MRA HEAD WITHOUT CONTRAST TECHNIQUE: Multiplanar, multiecho pulse sequences of the brain and surrounding structures  were obtained without intravenous contrast. Angiographic images of the head were obtained using MRA technique without contrast. COMPARISON:  CT head May 07, 2014 at 2016 hours and MRI of the brain December 12, 2013 and MRA head December 01, 2009 FINDINGS: MRI HEAD FINDINGS No reduced diffusion to suggest acute ischemia. LEFT temporal parietal occipital encephalomalacia with faint susceptibility artifact compatible with mineralization. Nonspecific punctate focus of susceptibility artifact RIGHT frontal lobe. Ex vacuo dilatation of LEFT occipital horn, ventricles and sulci are otherwise normal for patient's age. Patchy to confluent supratentorial white matter T2 hyperintensities stable from prior MRI. No midline shift, mass effect or mass lesions. Prominent perivascular spaces are unchanged. No abnormal extra-axial fluid collections. Ocular globes and orbital contents are normal. Trace paranasal sinus mucosal thickening without air-fluid levels. The mastoid air cells are well aerated. No abnormal sellar expansion. No cerebellar tonsillar ectopia. No suspicious calvarial bone marrow signal. Patient appears edentulous. MRA HEAD FINDINGS Anterior circulation: Normal flow related enhancement of the included cervical, petrous, cavernous and supraclinoid internal carotid arteries. 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 Stable appearance of 5 mm superiorly directed LEFT para ophthalmic aneurysm. Patent anterior communicating artery. Normal flow related enhancement of the anterior cerebral arteries, including distal segments. Paucity of mid to distal LEFT MCA branches compatible with old infarct. No large vessel occlusion, high-grade stenosis, abnormal luminal irregularity. Posterior circulation: RIGHT vertebral artery is dominant. Basilar artery is  patent, with normal flow related enhancement of the main branch vessels. Normal flow related enhancement of the posterior cerebral arteries. Fetal origin RIGHT posterior cerebral artery. No large vessel occlusion, high-grade stenosis, abnormal luminal irregularity, aneurysm. IMPRESSION: MRI HEAD: No acute intracranial process, specifically no acute ischemia. Old large LEFT MCA territory infarct. Moderate to severe chronic small vessel ischemic disease. MRA HEAD: No acute large vessel occlusion or high-grade stenosis. Paucity of mid to distal LEFT MCA branches compatible with old infarct. 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 Electronically Signed   By: Awilda Metro M.D.   On: 05/09/2015 01:02   Mr Brain Wo Contrast  05/09/2015  CLINICAL DATA:  Larey Seat twice, worsening RIGHT-sided weakness. Unable to walk. History of hypertension, hyperlipidemia, stroke 2010 with residual mild RIGHT hemi paresis. EXAM: MRI HEAD WITHOUT CONTRAST MRA HEAD WITHOUT CONTRAST TECHNIQUE: Multiplanar, multiecho pulse sequences of the brain and surrounding structures were obtained without intravenous contrast. Angiographic images of the head were obtained using MRA technique without contrast. COMPARISON:  CT head May 07, 2014 at 2016 hours and MRI of the brain December 12, 2013 and MRA head December 01, 2009 FINDINGS: MRI HEAD FINDINGS No reduced diffusion to suggest acute ischemia. LEFT temporal parietal occipital encephalomalacia with faint susceptibility artifact compatible with mineralization. Nonspecific punctate focus of susceptibility artifact RIGHT frontal lobe. Ex vacuo dilatation of LEFT occipital horn, ventricles and sulci are otherwise normal for patient's age. Patchy to confluent supratentorial white matter T2 hyperintensities stable from prior MRI. No midline shift, mass effect or mass lesions. Prominent perivascular spaces are unchanged. No abnormal extra-axial fluid  collections. Ocular globes and orbital contents are normal. Trace paranasal sinus mucosal thickening without air-fluid levels. The mastoid air cells are well aerated. No abnormal sellar expansion. No cerebellar tonsillar ectopia. No suspicious calvarial bone marrow signal. Patient appears edentulous. MRA HEAD FINDINGS Anterior circulation: Normal flow related enhancement of the included cervical, petrous, cavernous and supraclinoid internal carotid arteries. 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 Stable appearance of 5 mm superiorly directed LEFT para ophthalmic aneurysm. Patent anterior communicating artery. Normal flow related enhancement of the anterior cerebral arteries, including distal segments. Paucity of mid to distal LEFT MCA branches compatible with old infarct. No large vessel occlusion, high-grade stenosis, abnormal luminal irregularity. Posterior circulation: RIGHT vertebral artery is dominant. Basilar artery is patent, with normal flow related enhancement of the main branch vessels. Normal flow related enhancement of the posterior cerebral arteries. Fetal origin RIGHT posterior cerebral artery. No large vessel occlusion, high-grade stenosis, abnormal luminal irregularity, aneurysm. IMPRESSION: MRI HEAD: No acute intracranial process, specifically no acute ischemia. Old large LEFT MCA territory infarct. Moderate to severe chronic small vessel ischemic disease. MRA HEAD: No acute large vessel occlusion or high-grade stenosis. Paucity of mid to distal LEFT MCA branches compatible with old infarct. 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 Electronically Signed   By: Awilda Metro M.D.   On: 05/09/2015 01:02   Dg Knee Complete 4 Views Right  05/22/2015  CLINICAL DATA:  Acute onset of left knee pain and bruising. Initial encounter. EXAM: RIGHT KNEE - COMPLETE 4+ VIEW COMPARISON:  None.  FINDINGS: There is no evidence of fracture or dislocation. The joint spaces are preserved. Marginal osteophytes are seen arising at the medial compartment, and mild osteophyte formation is noted at the tibial spine. A small knee joint effusion is noted. The visualized soft tissues are otherwise unremarkable in appearance. IMPRESSION: 1. No evidence of fracture or dislocation. 2. 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 3. Small knee joint effusion noted. Electronically Signed   By: Roanna Raider M.D.   On: 05/22/2015 01:01   Dg Foot Complete Right  05/10/2015  CLINICAL DATA:  Right foot pain. Possible fall 3-4 days ago. Midfoot bruising changes. EXAM: RIGHT FOOT COMPLETE - 3+ VIEW COMPARISON:  None. FINDINGS: Moderate midfoot degenerative changes with possible erosive findings. No acute fracture is identified. Hallux valgus deformity and degenerative change at the first metatarsal phalangeal joint. IMPRESSION: Midfoot degenerative changes but no acute fracture. Electronically Signed   By: Rudie Meyer M.D.   On: 05/10/2015 09:44         Subjective: Patient denies fevers, chills, headache, chest pain, dyspnea, nausea, vomiting, diarrhea, abdominal pain, dysuria, hematuria   Objective: Filed Vitals:   05/22/15 1215 05/22/15 1230 05/22/15 1300 05/22/15 1330  BP:  128/65 110/56 101/50  Pulse: 86 92 76 73  Temp:      TempSrc:      Resp: 15 16    SpO2: 97% 96% 97% 92%   No intake or output data in the 24 hours ending 05/22/15 1339 Weight change:  Exam:   General:  Pt is alert, follows commands appropriately, not in acute distress  HEENT: No icterus, No thrush, No neck mass, [place]/AT  Cardiovascular: RRR, S1/S2, no rubs, no gallops  Respiratory: CTA bilaterally, no wheezing, no crackles, no rhonchi  Abdomen: Soft/+BS, non tender, non distended, no guarding; no HSM  Extremities: 2+LLE edema, No lymphangitis, No petechiae, No rashes, no synovitis; capillary refill less than 2  seconds. No signs of compartment syndrome-soft tissue compartments   Data Reviewed: Basic Metabolic Panel:  Recent Labs Lab 05/22/15 0006  NA 140  K 4.0  CL 103  CO2 27  GLUCOSE 110*  BUN 18  CREATININE 0.63  CALCIUM 9.0   Liver Function Tests: No results for input(s): AST,  ALT, ALKPHOS, BILITOT, PROT, ALBUMIN in the last 168 hours. No results for input(s): LIPASE, AMYLASE in the last 168 hours. No results for input(s): AMMONIA in the last 168 hours. CBC:  Recent Labs Lab 05/22/15 0006  WBC 9.8  HGB 13.3  HCT 38.8  MCV 96.3  PLT 178   Cardiac Enzymes: No results for input(s): CKTOTAL, CKMB, CKMBINDEX, TROPONINI in the last 168 hours. BNP: Invalid input(s): POCBNP CBG: No results for input(s): GLUCAP in the last 168 hours.  No results found for this or any previous visit (from the past 240 hour(s)).   Scheduled Meds: . atorvastatin  40 mg Oral Daily  . clopidogrel  75 mg Oral Daily  . donepezil  10 mg Oral QHS  . enoxaparin (LOVENOX) injection  65 mg Subcutaneous Q12H  . lisinopril  10 mg Oral Daily  . sodium chloride  3 mL Intravenous Q12H   Continuous Infusions:    Stacey Viviani, DO  [facility] Pager [phone]  If 7PM-7AM, please contact night-coverage [email or web address] Password [email or web address] 05/22/2015, 1:39 PM

## 2015-05-22 NOTE — ED Notes (Signed)
Attempted report 

## 2015-05-22 NOTE — Progress Notes (Addendum)
Patient ID: [name], female   DOB: [birth year], 75 y.o.   MRN: [id number] Aware of request for eval of pt for LLE DVT thrombolysis as well as treatment of bilat paraophthalmic aneurysms. Dr. Corliss Skains [phone]) spoke with pt's daughter yesterday regarding aneurysm treatment and family will discuss matter further before deciding on case. Case also reviewed by Dr. Fredia Sorrow ([phone])and due to presence of cerebral aneurysms pt is not candidate for thrombolytic therapy of LLE DVT with TPA. Will see pt over weekend to update status. Family currently not present with pt.

## 2015-05-23 DIAGNOSIS — I82402 Acute embolism and thrombosis of unspecified deep veins of left lower extremity: Secondary | ICD-10-CM | POA: Diagnosis not present

## 2015-05-23 DIAGNOSIS — F0391 Unspecified dementia with behavioral disturbance: Secondary | ICD-10-CM | POA: Diagnosis not present

## 2015-05-23 DIAGNOSIS — I82412 Acute embolism and thrombosis of left femoral vein: Secondary | ICD-10-CM | POA: Diagnosis not present

## 2015-05-23 DIAGNOSIS — I671 Cerebral aneurysm, nonruptured: Secondary | ICD-10-CM

## 2015-05-23 DIAGNOSIS — I10 Essential (primary) hypertension: Secondary | ICD-10-CM | POA: Diagnosis not present

## 2015-05-23 LAB — CBC
HEMATOCRIT: 38.2 % (ref 36.0–46.0)
Hemoglobin: 13 g/dL (ref 12.0–15.0)
MCH: 32.8 pg (ref 26.0–34.0)
MCHC: 34 g/dL (ref 30.0–36.0)
MCV: 96.5 fL (ref 78.0–100.0)
Platelets: 208 10*3/uL (ref 150–400)
RBC: 3.96 MIL/uL (ref 3.87–5.11)
RDW: 12.4 % (ref 11.5–15.5)
WBC: 9.1 10*3/uL (ref 4.0–10.5)

## 2015-05-23 LAB — BASIC METABOLIC PANEL
Anion gap: 11 (ref 5–15)
BUN: 20 mg/dL (ref 6–20)
CALCIUM: 9.2 mg/dL (ref 8.9–10.3)
CHLORIDE: 103 mmol/L (ref 101–111)
CO2: 27 mmol/L (ref 22–32)
CREATININE: 0.64 mg/dL (ref 0.44–1.00)
GFR calc Af Amer: >60 mL/min (ref >60)
GFR calc non Af Amer: >60 mL/min (ref >60)
GLUCOSE: 103 mg/dL — AB (ref 65–99)
Potassium: 4 mmol/L (ref 3.5–5.1)
Sodium: 141 mmol/L (ref 135–145)

## 2015-05-23 MED ORDER — APIXABAN 5 MG PO TABS: 10.0000 mg | ORAL_TABLET | Freq: Two times a day (BID) | ORAL | Status: DC

## 2015-05-23 MED ORDER — APIXABAN 5 MG PO TABS: 5.0000 mg | ORAL_TABLET | Freq: Two times a day (BID) | ORAL | Status: AC

## 2015-05-23 NOTE — Progress Notes (Signed)
Spoke with daughter re: pt d/c back to [facility] today.  Per daughter, she specifically asked for this not to happen and explained to nursing who was to pass on to CSW.  Daughter adamantly refuses for pt to return to [facility] because "they will kill her."  Daughter stated that she prefers [facility], then asked to call CSW back as she was in the MD's office for her own illness then abruptly hung up.  CSW will continue to work on pt's disposition.

## 2015-05-23 NOTE — Discharge Instructions (Signed)
Information on my medicine - ELIQUIS (apixaban)  This medication education was reviewed with me or my healthcare representative as part of my discharge preparation.  The pharmacist that spoke with me during my hospital stay was:  Almon Hercules, [facility]  Why was Eliquis prescribed for you? Eliquis was prescribed to treat blood clots that may have been found in the veins of your legs (deep vein thrombosis) or in your lungs (pulmonary embolism) and to reduce the risk of them occurring again.  What do You need to know about Eliquis ? The starting dose is 10 mg (two 5 mg tablets) taken TWICE daily for the FIRST SEVEN (7) DAYS, then on (enter date)  05/29/2015  the dose is reduced to ONE 5 mg tablet taken TWICE daily.  Eliquis may be taken with or without food.   Try to take the dose about the same time in the morning and in the evening. If you have difficulty swallowing the tablet whole please discuss with your pharmacist how to take the medication safely.  Take Eliquis exactly as prescribed and DO NOT stop taking Eliquis without talking to the doctor who prescribed the medication.  Stopping may increase your risk of developing a new blood clot.  Refill your prescription before you run out.  After discharge, you should have regular check-up appointments with your healthcare provider that is prescribing your Eliquis.    What do you do if you miss a dose? If a dose of ELIQUIS is not taken at the scheduled time, take it as soon as possible on the same day and twice-daily administration should be resumed. The dose should not be doubled to make up for a missed dose.  Important Safety Information A possible side effect of Eliquis is bleeding. You should call your healthcare provider right away if you experience any of the following: ? Bleeding from an injury or your nose that does not stop. ? Unusual colored urine (red or dark brown) or unusual colored stools (red or black). ? Unusual bruising for  unknown reasons. ? A serious fall or if you hit your head (even if there is no bleeding).  Some medicines may interact with Eliquis and might increase your risk of bleeding or clotting while on Eliquis. To help avoid this, consult your healthcare provider or pharmacist prior to using any new prescription or non-prescription medications, including herbals, vitamins, non-steroidal anti-inflammatory drugs (NSAIDs) and supplements.  This website has more information on Eliquis (apixaban): http://[email or web address].com/eliquis/home

## 2015-05-23 NOTE — Discharge Summary (Signed)
Physician Discharge Summary  [name] ZOX:[id number] DOB: 04-02-1941 DOA: 05/21/2015  PCP: Leanor Rubenstein, MD  Admit date: 05/21/2015 Discharge date: 05/23/2015  Recommendations for Outpatient Follow-up:  1. Pt will need to follow up with PCP in 2 weeks post discharge 2. 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 Corliss Skains [phone]) spoke with pt's daughter 05/21/15 regarding aneurysm treatment and family will discuss matter further before deciding on case -after starting apixaban, the patient remained clinically stable and her CBC remained stable -Patient will take apixaban 10 mg po bid through am dose on 05/29/15;  Starting on 05/29/15 at 2200 hours, she will start apixaban 5 mg po bid Right Knee Pain -05/22/15 xray--minimal OA, small effusion Essential Hypertension -continue lisionpril -controlled History of Stroke -continue Plavix Hyperlipidemia -continue statin Dementia with behavioral disturbance -continue Aricept CAD with hx of stent -EKG without concerning ischemic changes -no anginal symptoms -continue plavix  Discharge Condition: stable  Disposition: SNF  Diet:cardiac Wt Readings from Last 3 Encounters:  05/09/15 64.592 kg (142 lb 6.4 oz)  03/12/14 60.782 kg (134 lb)  03/09/14 61.961 kg (136 lb 9.6 oz)    History of present illness:  75 year old female with a history of stroke, dementia, hypertension, hyperlipidemia, coronary artery disease status post stenting presented with left lower  extremity pain and edema. Patient is unable to provide any history at this time secondary to her underlying dementia. All of this history is obtained from speaking with the patient's daughter and review of the medical record. Apparently, the patient had mechanical fall at rehabilitation 2 weeks prior to admission resulting in pain and swelling in the right leg and difficulty bearing weight on the right leg. Since then, the patient has not been very ambulatory. Patient denies fevers, chills, headache, chest pain, dyspnea, nausea, vomiting, diarrhea, abdominal pain, dysuria, hematuria. Patient was recently discharged from [facility] on 05/10/2015 to [facility]. During that admission, the patient was seen by neurology for worsening of her Right hemiparesis. MRI of the brain was negative for acute stroke. It was thought that the patient's unit tract infection likely contributed to the patient's worsening of her chronic right-sided hemiparesis. The patient was initially placed on [place] lovenox and was transitioned to apixaban.  She tolerated apixaban without difficulty.  Discharge Exam: Filed Vitals:   05/22/15 1501 05/23/15 0607  BP: 115/52 123/51  Pulse:  70  Temp:  98.5 F (36.9 C)  Resp:  18   Filed Vitals:   05/22/15 1430 05/22/15 1459 05/22/15 1501 05/23/15 0607  BP: 119/51 99/60 115/52 123/51  Pulse: 73 76  70  Temp:  98.7 F (37.1 C)  98.5 F (36.9 C)  TempSrc:  Oral  Oral  Resp:    18  SpO2: 95% 96%  95%   General: Awake and alert, NAD, pleasant, cooperative Cardiovascular: RRR, no rub, no gallop, no S3 Respiratory: CTAB, no wheeze, no rhonchi Abdomen:soft, nontender, nondistended, positive bowel sounds Extremities: 1+LLE edema, No lymphangitis, no petechiae  Discharge Instructions      Discharge Instructions    Diet - low sodium heart healthy    Complete by:  As directed      Increase activity slowly    Complete by:  As directed             Medication List    STOP taking  these medications        divalproex 125 MG DR tablet  Commonly known as:  DEPAKOTE      TAKE these medications        apixaban 5 MG Tabs tablet  Commonly known as:  ELIQUIS  Take 2 tablets (10 mg total) by mouth 2 (two) times daily.     apixaban 5 MG Tabs tablet  Commonly known as:  ELIQUIS  Take 1 tablet (5 mg total) by mouth 2 (two) times daily.  Start taking on:  05/29/2015     ARICEPT 10 MG tablet  Generic drug:  donepezil  TAKE 1 TABLET BY MOUTH EVERY DAY AT BEDTIME     bisacodyl 10 MG suppository  Commonly known as:  DULCOLAX  Place 10 mg rectally daily as needed for moderate constipation.     clopidogrel 75 MG tablet  Commonly known as:  PLAVIX  TAKE 1 TABLET BY MOUTH EVERY DAY     LIPITOR 40 MG tablet  Generic drug:  atorvastatin  TAKE 1 TABLET BY MOUTH EVERY DAY     lisinopril 10 MG tablet  Commonly known as:  PRINIVIL,ZESTRIL  TAKE 1 TABLET EVERY DAY     magnesium hydroxide 400 MG/5ML suspension  Commonly known as:  MILK OF MAGNESIA  Take 30 mLs by mouth daily as needed for mild constipation.     metoprolol tartrate 25 MG tablet  Commonly known as:  LOPRESSOR  Take 25 mg by mouth 2 (two) times daily.     nitroGLYCERIN 0.4 MG SL tablet  Commonly known as:  NITROSTAT  Place 1 tablet (0.4 mg total) under the tongue every 5 (five) minutes as needed for chest pain.     RA SALINE ENEMA RE  Place 1 application rectally daily as needed (constipation).         The results of significant diagnostics from this hospitalization (including imaging, microbiology, ancillary and laboratory) are listed below for reference.    Significant Diagnostic Studies: Ct Head Wo Contrast  05/08/2015  CLINICAL DATA:  Right-sided weakness and altered mental status. History of old infarct. EXAM: CT HEAD WITHOUT CONTRAST TECHNIQUE: Contiguous axial images were obtained from the base of the skull through the vertex without intravenous contrast. COMPARISON:  Brain MRI, 12/12/2013.   Head CT, 06/20/2011. FINDINGS: Ventricles are normal configuration. There is ex vacuo dilation of the posterior left lateral ventricle due to an old left sided infarct that involves the majority of the left parietal lobe posterior left temporal lobe and portions of the left occipital lobe. This is stable from the prior exams. There is no hydrocephalus. There are no parenchymal masses or mass effect. There is no evidence of a recent cortical infarct. Other patchy areas of white matter hypoattenuation are noted consistent with moderate chronic microvascular ischemic change, also stable. There are no extra-axial masses or abnormal fluid collections. There is no intracranial hemorrhage. Visualized sinuses and mastoid air cells are clear. IMPRESSION: 1. No acute intracranial abnormalities. 2. Old left posterior MCA distribution infarct. 3. Moderate chronic microvascular ischemic change. 4. Stable appearance from the prior head CT and brain MRI. Electronically Signed   By: Amie Portland M.D.   On: 05/08/2015 20:23   Mr Maxine Glenn Head Wo Contrast  05/09/2015  CLINICAL DATA:  Fell twice, worsening RIGHT-sided weakness. Unable to walk. History of hypertension, hyperlipidemia, stroke 2010 with residual mild RIGHT hemi paresis. EXAM: MRI HEAD WITHOUT CONTRAST MRA HEAD WITHOUT CONTRAST TECHNIQUE: Multiplanar, multiecho pulse sequences of the brain and surrounding structures were obtained without intravenous contrast. Angiographic images of the head were obtained using MRA technique without contrast. COMPARISON:  CT head May 07, 2014 at 2016 hours and MRI of the brain December 12, 2013 and MRA head December 01, 2009 FINDINGS: MRI HEAD FINDINGS No reduced diffusion to suggest acute ischemia. LEFT temporal parietal occipital encephalomalacia with faint susceptibility artifact compatible with mineralization. Nonspecific punctate focus of susceptibility artifact RIGHT frontal lobe. Ex vacuo dilatation of LEFT occipital horn, ventricles  and sulci are otherwise normal for patient's age. Patchy to confluent supratentorial white matter T2 hyperintensities stable from prior MRI. No midline shift, mass effect or mass lesions. Prominent perivascular spaces are unchanged. No abnormal extra-axial fluid collections. Ocular globes and orbital contents are normal. Trace paranasal sinus mucosal thickening without air-fluid levels. The mastoid air cells are well aerated. No abnormal sellar expansion. No cerebellar tonsillar ectopia. No suspicious calvarial bone marrow signal. Patient appears edentulous. MRA HEAD FINDINGS Anterior circulation: Normal flow related enhancement of the included cervical, petrous, cavernous and supraclinoid internal carotid arteries. 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 Stable appearance of 5 mm superiorly directed LEFT para ophthalmic aneurysm. Patent anterior communicating artery. Normal flow related enhancement of the anterior cerebral arteries, including distal segments. Paucity of mid to distal LEFT MCA branches compatible with old infarct. No large vessel occlusion, high-grade stenosis, abnormal luminal irregularity. Posterior circulation: RIGHT vertebral artery is dominant. Basilar artery is patent, with normal flow related enhancement of the main branch vessels. Normal flow related enhancement of the posterior cerebral arteries. Fetal origin RIGHT posterior cerebral artery. No large vessel occlusion, high-grade stenosis, abnormal luminal irregularity, aneurysm. IMPRESSION: MRI HEAD: No acute intracranial process, specifically no acute ischemia. Old large LEFT MCA territory infarct. Moderate to severe chronic small vessel ischemic disease. MRA HEAD: No acute large vessel occlusion or high-grade stenosis. Paucity of mid to distal LEFT MCA branches compatible with old infarct. 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  Electronically Signed   By: Awilda Metro M.D.   On: 05/09/2015 01:02   Mr Brain Wo Contrast  05/09/2015  CLINICAL DATA:  Larey Seat twice, worsening RIGHT-sided weakness. Unable to walk. History of hypertension, hyperlipidemia, stroke 2010 with residual mild RIGHT hemi paresis. EXAM: MRI HEAD WITHOUT CONTRAST MRA HEAD WITHOUT CONTRAST TECHNIQUE: Multiplanar, multiecho pulse sequences of the brain and surrounding structures were obtained without intravenous contrast. Angiographic images of the head were obtained using MRA technique without contrast. COMPARISON:  CT head May 07, 2014 at 2016 hours and MRI of the brain December 12, 2013 and MRA head December 01, 2009 FINDINGS: MRI HEAD FINDINGS No reduced diffusion to suggest acute ischemia. LEFT temporal parietal occipital encephalomalacia with faint susceptibility artifact compatible with mineralization. Nonspecific punctate focus of susceptibility artifact RIGHT frontal lobe. Ex vacuo dilatation of LEFT occipital horn, ventricles and sulci are otherwise normal for patient's age. Patchy to confluent supratentorial white matter T2 hyperintensities stable from prior MRI. No midline shift, mass effect or mass lesions. Prominent perivascular spaces are unchanged. No abnormal extra-axial fluid collections. Ocular globes and orbital contents are normal. Trace paranasal sinus mucosal thickening without air-fluid levels. The mastoid air cells are well aerated. No abnormal  sellar expansion. No cerebellar tonsillar ectopia. No suspicious calvarial bone marrow signal. Patient appears edentulous. MRA HEAD FINDINGS Anterior circulation: Normal flow related enhancement of the included cervical, petrous, cavernous and supraclinoid internal carotid arteries. 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 Stable appearance of 5 mm superiorly directed LEFT para ophthalmic aneurysm. Patent anterior communicating artery. Normal flow related  enhancement of the anterior cerebral arteries, including distal segments. Paucity of mid to distal LEFT MCA branches compatible with old infarct. No large vessel occlusion, high-grade stenosis, abnormal luminal irregularity. Posterior circulation: RIGHT vertebral artery is dominant. Basilar artery is patent, with normal flow related enhancement of the main branch vessels. Normal flow related enhancement of the posterior cerebral arteries. Fetal origin RIGHT posterior cerebral artery. No large vessel occlusion, high-grade stenosis, abnormal luminal irregularity, aneurysm. IMPRESSION: MRI HEAD: No acute intracranial process, specifically no acute ischemia. Old large LEFT MCA territory infarct. Moderate to severe chronic small vessel ischemic disease. MRA HEAD: No acute large vessel occlusion or high-grade stenosis. Paucity of mid to distal LEFT MCA branches compatible with old infarct. 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 Electronically Signed   By: Awilda Metro M.D.   On: 05/09/2015 01:02   Dg Knee Complete 4 Views Right  05/22/2015  CLINICAL DATA:  Acute onset of left knee pain and bruising. Initial encounter. EXAM: RIGHT KNEE - COMPLETE 4+ VIEW COMPARISON:  None. FINDINGS: There is no evidence of fracture or dislocation. The joint spaces are preserved. Marginal osteophytes are seen arising at the medial compartment, and mild osteophyte formation is noted at the tibial spine. A small knee joint effusion is noted. The visualized soft tissues are otherwise unremarkable in appearance. IMPRESSION: 1. No evidence of fracture or dislocation. 2. 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 3. Small knee joint effusion noted. Electronically Signed   By: Roanna Raider M.D.   On: 05/22/2015 01:01   Dg Foot Complete Right  05/10/2015  CLINICAL DATA:  Right foot pain. Possible fall 3-4 days ago. Midfoot bruising changes. EXAM: RIGHT FOOT COMPLETE - 3+  VIEW COMPARISON:  None. FINDINGS: Moderate midfoot degenerative changes with possible erosive findings. No acute fracture is identified. Hallux valgus deformity and degenerative change at the first metatarsal phalangeal joint. IMPRESSION: Midfoot degenerative changes but no acute fracture. Electronically Signed   By: Rudie Meyer M.D.   On: 05/10/2015 09:44     Microbiology: No results found for this or any previous visit (from the past 240 hour(s)).   Labs: Basic Metabolic Panel:  Recent Labs Lab 05/22/15 0006 05/23/15 0610  NA 140 141  K 4.0 4.0  CL 103 103  CO2 27 27  GLUCOSE 110* 103*  BUN 18 20  CREATININE 0.63 0.64  CALCIUM 9.0 9.2   Liver Function Tests: No results for input(s): AST, ALT, ALKPHOS, BILITOT, PROT, ALBUMIN in the last 168 hours. No results for input(s): LIPASE, AMYLASE in the last 168 hours. No results for input(s): AMMONIA in the last 168 hours. CBC:  Recent Labs Lab 05/22/15 0006 05/23/15 0610  WBC 9.8 9.1  HGB 13.3 13.0  HCT 38.8 38.2  MCV 96.3 96.5  PLT 178 208   Cardiac Enzymes: No results for input(s): CKTOTAL, CKMB, CKMBINDEX, TROPONINI in the last 168 hours. BNP: Invalid input(s): POCBNP CBG: No results for input(s): GLUCAP in the last 168 hours.  Time coordinating discharge:  Greater than 30 minutes  Signed:  Jannessa Ogden, DO [facility] Pager: [phone] 05/23/2015, 1:44 PM

## 2015-05-23 NOTE — Consult Note (Signed)
Chief Complaint: Patient was seen in consultation today for left lower extremity DVT thrombolysis/endovascular treatment of paraophthalmic aneurysms Chief Complaint  Patient presents with  . Leg Problem    Referring Physician(s): TRH  History of Present Illness: [name] is a 75 y.o. female with past medical history significant for hypertension, dementia, hyperlipidemia, coronary artery disease with prior stenting in 2000 as well as prior stroke in 2010 (chronic mild residual right-sided hemiparesis) with known bilateral paraophthalmic aneurysms. She was on Plavix therapy as outpatient. She was admitted to the hospital on 1/20 with left lower extremity pain and swelling. Subsequent left lower extremity venous duplex study revealed acute DVT in the saphenofemoral junction, common femoral and proximal femoral veins. Patient was evaluated by pharmacy and Lovenox was initially started with plans to switch to eliquis. Request now received from [facility] for consideration of left lower extremity DVT thrombolysis as well as potential endovascular treatment of paraophthalmic aneurysms. Recent MRI MRA of the brain 05/09/15 revealed no acute abnormalities, old large left MCA infarct, moderate to severe chronic small vessel ischemic disease, and stable appearance of 6 mm right paraophthalmic aneurysm and 5 mm left paraophthalmic aneurysm.  Past Medical History  Diagnosis Date  . Hypertension   . Hyperlipidemia   . Coronary artery disease 2000    STATUS POT [facility] ND STENTING OF THE LAD   . CVA (cerebrovascular accident) (HCC) 2010    Past Surgical History  Procedure Laterality Date  . None      Allergies: Seroquel  Medications: Prior to Admission medications   Medication Sig Start Date End Date Taking? Authorizing Provider  ARICEPT 10 MG tablet TAKE 1 TABLET BY MOUTH EVERY DAY AT BEDTIME 04/29/15  Yes Micki Riley, MD  bisacodyl (DULCOLAX) 10 MG suppository Place 10 mg rectally daily as  needed for moderate constipation.   Yes Historical Provider, MD  clopidogrel (PLAVIX) 75 MG tablet TAKE 1 TABLET BY MOUTH EVERY DAY 03/10/15  Yes Vesta Mixer, MD  LIPITOR 40 MG tablet TAKE 1 TABLET BY MOUTH EVERY DAY 05/10/15  Yes Vesta Mixer, MD  lisinopril (PRINIVIL,ZESTRIL) 10 MG tablet TAKE 1 TABLET EVERY DAY 03/10/15  Yes Vesta Mixer, MD  magnesium hydroxide (MILK OF MAGNESIA) 400 MG/5ML suspension Take 30 mLs by mouth daily as needed for mild constipation.   Yes Historical Provider, MD  metoprolol tartrate (LOPRESSOR) 25 MG tablet Take 25 mg by mouth 2 (two) times daily.   Yes Historical Provider, MD  nitroGLYCERIN (NITROSTAT) 0.4 MG SL tablet Place 1 tablet (0.4 mg total) under the tongue every 5 (five) minutes as needed for chest pain. 03/12/14  Yes Vesta Mixer, MD  Sodium Phosphates (RA SALINE ENEMA RE) Place 1 application rectally daily as needed (constipation).   Yes Historical Provider, MD  divalproex (DEPAKOTE) 125 MG DR tablet Take 250 mg by mouth every morning. Reported on 05/22/2015    Historical Provider, MD     Family History  Problem Relation Age of Onset  . Alzheimer's disease Mother     Social History   Social History  . Marital Status: Widowed    Spouse Name: N/A  . Number of Children: 4  . Years of Education: 50   Social History Main Topics  . Smoking status: Former Smoker    Quit date: 05/01/2008  . Smokeless tobacco: Never Used  . Alcohol Use: No  . Drug Use: No  . Sexual Activity: Not Asked   Other Topics Concern  .  None   Social History Narrative   Patient is single, with 4 living children, and 2 deceased children   Patient is left handed   Patient has her GED   Patient drinks 2 cups daily      Review of Systems patient currently denies significant complaints other than some left lower extremity swelling with occasional paresthesias and discomfort; she currently denies headache, chest pain, respiratory difficulties, nausea/ vomiting or  abnormal bleeding.  Vital Signs: BP 123/51 mmHg  Pulse 70  Temp(Src) 98.5 F (36.9 C) (Oral)  Resp 18  SpO2 95%  Physical Exam patient is awake but pleasantly confused. Does not recall date of birth. Follows commands. Chest clear to auscultation bilaterally. Heart with regular rate and rhythm. Abdomen soft, positive bowel sounds, nontender, nondistended. Right lower extremity no significant edema. Left lower extremity with 2+ edema, no significant erythema, +/- distal pulses, full range of motion, intact strength/sensory function.  Mallampati Score:     Imaging: Ct Head Wo Contrast  05/08/2015  CLINICAL DATA:  Right-sided weakness and altered mental status. History of old infarct. EXAM: CT HEAD WITHOUT CONTRAST TECHNIQUE: Contiguous axial images were obtained from the base of the skull through the vertex without intravenous contrast. COMPARISON:  Brain MRI, 12/12/2013.  Head CT, 06/20/2011. FINDINGS: Ventricles are normal configuration. There is ex vacuo dilation of the posterior left lateral ventricle due to an old left sided infarct that involves the majority of the left parietal lobe posterior left temporal lobe and portions of the left occipital lobe. This is stable from the prior exams. There is no hydrocephalus. There are no parenchymal masses or mass effect. There is no evidence of a recent cortical infarct. Other patchy areas of white matter hypoattenuation are noted consistent with moderate chronic microvascular ischemic change, also stable. There are no extra-axial masses or abnormal fluid collections. There is no intracranial hemorrhage. Visualized sinuses and mastoid air cells are clear. IMPRESSION: 1. No acute intracranial abnormalities. 2. Old left posterior MCA distribution infarct. 3. Moderate chronic microvascular ischemic change. 4. Stable appearance from the prior head CT and brain MRI. Electronically Signed   By: Amie Portland M.D.   On: 05/08/2015 20:23   Mr Maxine Glenn Head Wo  Contrast  05/09/2015  CLINICAL DATA:  Larey Seat twice, worsening RIGHT-sided weakness. Unable to walk. History of hypertension, hyperlipidemia, stroke 2010 with residual mild RIGHT hemi paresis. EXAM: MRI HEAD WITHOUT CONTRAST MRA HEAD WITHOUT CONTRAST TECHNIQUE: Multiplanar, multiecho pulse sequences of the brain and surrounding structures were obtained without intravenous contrast. Angiographic images of the head were obtained using MRA technique without contrast. COMPARISON:  CT head May 07, 2014 at 2016 hours and MRI of the brain December 12, 2013 and MRA head December 01, 2009 FINDINGS: MRI HEAD FINDINGS No reduced diffusion to suggest acute ischemia. LEFT temporal parietal occipital encephalomalacia with faint susceptibility artifact compatible with mineralization. Nonspecific punctate focus of susceptibility artifact RIGHT frontal lobe. Ex vacuo dilatation of LEFT occipital horn, ventricles and sulci are otherwise normal for patient's age. Patchy to confluent supratentorial white matter T2 hyperintensities stable from prior MRI. No midline shift, mass effect or mass lesions. Prominent perivascular spaces are unchanged. No abnormal extra-axial fluid collections. Ocular globes and orbital contents are normal. Trace paranasal sinus mucosal thickening without air-fluid levels. The mastoid air cells are well aerated. No abnormal sellar expansion. No cerebellar tonsillar ectopia. No suspicious calvarial bone marrow signal. Patient appears edentulous. MRA HEAD FINDINGS Anterior circulation: Normal flow related enhancement of the included cervical, petrous,  cavernous and supraclinoid internal carotid arteries. 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 Stable appearance of 5 mm superiorly directed LEFT para ophthalmic aneurysm. Patent anterior communicating artery. Normal flow related enhancement of the anterior cerebral arteries, including distal segments. Paucity of mid  to distal LEFT MCA branches compatible with old infarct. No large vessel occlusion, high-grade stenosis, abnormal luminal irregularity. Posterior circulation: RIGHT vertebral artery is dominant. Basilar artery is patent, with normal flow related enhancement of the main branch vessels. Normal flow related enhancement of the posterior cerebral arteries. Fetal origin RIGHT posterior cerebral artery. No large vessel occlusion, high-grade stenosis, abnormal luminal irregularity, aneurysm. IMPRESSION: MRI HEAD: No acute intracranial process, specifically no acute ischemia. Old large LEFT MCA territory infarct. Moderate to severe chronic small vessel ischemic disease. MRA HEAD: No acute large vessel occlusion or high-grade stenosis. Paucity of mid to distal LEFT MCA branches compatible with old infarct. 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 Electronically Signed   By: Awilda Metro M.D.   On: 05/09/2015 01:02   Mr Brain Wo Contrast  05/09/2015  CLINICAL DATA:  Larey Seat twice, worsening RIGHT-sided weakness. Unable to walk. History of hypertension, hyperlipidemia, stroke 2010 with residual mild RIGHT hemi paresis. EXAM: MRI HEAD WITHOUT CONTRAST MRA HEAD WITHOUT CONTRAST TECHNIQUE: Multiplanar, multiecho pulse sequences of the brain and surrounding structures were obtained without intravenous contrast. Angiographic images of the head were obtained using MRA technique without contrast. COMPARISON:  CT head May 07, 2014 at 2016 hours and MRI of the brain December 12, 2013 and MRA head December 01, 2009 FINDINGS: MRI HEAD FINDINGS No reduced diffusion to suggest acute ischemia. LEFT temporal parietal occipital encephalomalacia with faint susceptibility artifact compatible with mineralization. Nonspecific punctate focus of susceptibility artifact RIGHT frontal lobe. Ex vacuo dilatation of LEFT occipital horn, ventricles and sulci are otherwise normal for patient's age.  Patchy to confluent supratentorial white matter T2 hyperintensities stable from prior MRI. No midline shift, mass effect or mass lesions. Prominent perivascular spaces are unchanged. No abnormal extra-axial fluid collections. Ocular globes and orbital contents are normal. Trace paranasal sinus mucosal thickening without air-fluid levels. The mastoid air cells are well aerated. No abnormal sellar expansion. No cerebellar tonsillar ectopia. No suspicious calvarial bone marrow signal. Patient appears edentulous. MRA HEAD FINDINGS Anterior circulation: Normal flow related enhancement of the included cervical, petrous, cavernous and supraclinoid internal carotid arteries. 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 Stable appearance of 5 mm superiorly directed LEFT para ophthalmic aneurysm. Patent anterior communicating artery. Normal flow related enhancement of the anterior cerebral arteries, including distal segments. Paucity of mid to distal LEFT MCA branches compatible with old infarct. No large vessel occlusion, high-grade stenosis, abnormal luminal irregularity. Posterior circulation: RIGHT vertebral artery is dominant. Basilar artery is patent, with normal flow related enhancement of the main branch vessels. Normal flow related enhancement of the posterior cerebral arteries. Fetal origin RIGHT posterior cerebral artery. No large vessel occlusion, high-grade stenosis, abnormal luminal irregularity, aneurysm. IMPRESSION: MRI HEAD: No acute intracranial process, specifically no acute ischemia. Old large LEFT MCA territory infarct. Moderate to severe chronic small vessel ischemic disease. MRA HEAD: No acute large vessel occlusion or high-grade stenosis. Paucity of mid to distal LEFT MCA branches compatible with old infarct. 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 Electronically Signed   By: Pernell Dupre  Bloomer M.D.    On: 05/09/2015 01:02   Dg Knee Complete 4 Views Right  05/22/2015  CLINICAL DATA:  Acute onset of left knee pain and bruising. Initial encounter. EXAM: RIGHT KNEE - COMPLETE 4+ VIEW COMPARISON:  None. FINDINGS: There is no evidence of fracture or dislocation. The joint spaces are preserved. Marginal osteophytes are seen arising at the medial compartment, and mild osteophyte formation is noted at the tibial spine. A small knee joint effusion is noted. The visualized soft tissues are otherwise unremarkable in appearance. IMPRESSION: 1. No evidence of fracture or dislocation. 2. 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 3. Small knee joint effusion noted. Electronically Signed   By: Roanna Raider M.D.   On: 05/22/2015 01:01   Dg Foot Complete Right  05/10/2015  CLINICAL DATA:  Right foot pain. Possible fall 3-4 days ago. Midfoot bruising changes. EXAM: RIGHT FOOT COMPLETE - 3+ VIEW COMPARISON:  None. FINDINGS: Moderate midfoot degenerative changes with possible erosive findings. No acute fracture is identified. Hallux valgus deformity and degenerative change at the first metatarsal phalangeal joint. IMPRESSION: Midfoot degenerative changes but no acute fracture. Electronically Signed   By: Rudie Meyer M.D.   On: 05/10/2015 09:44    Labs:  CBC:  Recent Labs  05/08/15 2012 05/22/15 0006 05/23/15 0610  WBC 10.2 9.8 9.1  HGB 13.9 13.3 13.0  HCT 40.4 38.8 38.2  PLT 180 178 208    COAGS:  Recent Labs  05/08/15 2012  INR 1.05  APTT 28    BMP:  Recent Labs  05/08/15 2012 05/22/15 0006 05/23/15 0610  NA 143 140 141  K 3.8 4.0 4.0  CL 109 103 103  CO2 24 27 27   GLUCOSE 110* 110* 103*  BUN 15 18 20   CALCIUM 9.4 9.0 9.2  CREATININE 0.64 0.63 0.64  GFRNONAA >60 >60 >60  GFRAA >60 >60 >60    LIVER FUNCTION TESTS:  Recent Labs  05/08/15 2012  BILITOT 0.6  AST 29  ALT 20  ALKPHOS 70  PROT 5.9*  ALBUMIN 3.7    TUMOR MARKERS: No results for input(s): AFPTM, CEA,  CA199, CHROMGRNA in the last 8760 hours.  Assessment and Plan: Patient with history of dementia, prior stroke 2010 manifested by expressive aphasia and right-sided hemiparesis, known bilateral paraophthalmic aneurysms, coronary artery disease with prior stenting in 2000 (on Plavix as outpatient), hypertension, hyperlipidemia recently admitted with acute left lower extremity DVT. Above case has been reviewed by Dr. Fredia Sorrow and due to presence of  paraophthalmic aneurysms patient is not a candidate for DVT thrombolysis with TPA. Anticoagulation is currently being managed by pharmacy and patient is on eliquis and Plavix. Patient was scheduled to undergo consultation with Dr. Corliss Skains regarding treatment of paraophthalmic aneurysms 1/20 as outpatient. However she was unable to keep her appointment. Dr.  Corliss Skains spoke with patient's daughter regarding potential endovascular treatment of the aneurysms and she was going to discuss matter further with family members before deciding whether to proceed with  intervention. Patient's daughter is currently not present with patient at this time. Will plan to follow-up with her daughter next week for further discussion.    Thank you for this interesting consult.  I greatly enjoyed meeting Kaelene Elliston and look forward to participating in their care.  A copy of this report was sent to the requesting provider on this date.  Electronically Signed: D. Jeananne Rama 05/23/2015, 9:14 AM   I spent a total of   20 minutes in face  to face in clinical consultation, greater than 50% of which was counseling/coordinating care for possible left lower extremity DVT thrombolysis/endovascular treatment of paraophthalmic aneurysms

## 2015-05-24 DIAGNOSIS — I10 Essential (primary) hypertension: Secondary | ICD-10-CM | POA: Diagnosis not present

## 2015-05-24 DIAGNOSIS — F0391 Unspecified dementia with behavioral disturbance: Secondary | ICD-10-CM | POA: Diagnosis not present

## 2015-05-24 DIAGNOSIS — I82412 Acute embolism and thrombosis of left femoral vein: Secondary | ICD-10-CM | POA: Diagnosis not present

## 2015-05-24 DIAGNOSIS — I671 Cerebral aneurysm, nonruptured: Secondary | ICD-10-CM | POA: Diagnosis not present

## 2015-05-24 NOTE — Evaluation (Signed)
Physical Therapy Evaluation Patient Details Name: [name] MRN: [id number] DOB: 04-14-1941 Today's Date: 05/24/2015   History of Present Illness  Patient is a 75 y/o female with hx of CVA in 2010 with mild rt hemi, speech deficits, CAD, HTN, HLD, dementia presents from SNF with LLE pain and edema. Patient was recently discharged from [facility] on 05/10/2015 to [facility]. Venous duplex positive for DVT left common femoral vein and left proximal femoral vein.  Clinical Impression  Patient presents with functional limitations due to deficits listed in PT problem list (see below). Pt with baseline speech deficits, pain, decreased strength, balance and endurance impacting safe mobility. Tolerated SPT to chair with Max A. Pt seems fearful of falling. Pt is appropriate to return to [facility] to maximize independence and mobility and minimize fall risk prior to return home.     Follow Up Recommendations SNF;Supervision for mobility/OOB    Equipment Recommendations  None recommended by PT    Recommendations for Other Services       Precautions / Restrictions Precautions Precautions: Fall Restrictions Weight Bearing Restrictions: No      Mobility  Bed Mobility Overal bed mobility: Needs Assistance Bed Mobility: Supine to Sit     Supine to sit: Min guard;HOB elevated     General bed mobility comments: Able to get to EOB without assist with increased time. Cues for technique and to use rail for support.   Transfers Overall transfer level: Needs assistance Equipment used: Rolling walker (2 wheeled) Transfers: Sit to/from [employer] Sit to Stand: Mod assist Stand pivot transfers: Max assist       General transfer comment: Pt with posterior bias in standing, stood fromE OB with cues for hand/foot placement. Pt seems fearful of falling and difficulty sequencing to stand. SPT to chair Max A for balance.   Ambulation/Gait                Stairs             Wheelchair Mobility    Modified Rankin (Stroke Patients Only)       Balance Overall balance assessment: Needs assistance Sitting-balance support: Feet supported;Bilateral upper extremity supported Sitting balance-Leahy Scale: Poor Sitting balance - Comments: Requires UE support for balance due to posterior lean. Postural control: Posterior lean Standing balance support: During functional activity Standing balance-Leahy Scale: Zero Standing balance comment: Not able to stand without external support.                             Pertinent Vitals/Pain Pain Assessment: Faces Pain Score: 4  Pain Location: bil feet?  incoherent speech when describing pain in I think was feet when donning socks. Pain Descriptors / Indicators: Grimacing Pain Intervention(s): Limited activity within patient's tolerance;Monitored during session;Repositioned    Home Living Family/patient expects to be discharged to:: Skilled nursing facility                 Additional Comments: Pt with hx of speech deficits from prior CVA per PT evaluation from 2 weeks ago, more expressive vs receptive.     Prior Function           Comments: Pt from [facility] for the last 2 weeks and not sure of pt's mobility level as difficult to obtain due to baseline dementia and speech deficits.      Hand Dominance        Extremity/Trunk Assessment   Upper Extremity  Assessment: Defer to OT evaluation;Generalized weakness           Lower Extremity Assessment: Generalized weakness         Communication   Communication: Expressive difficulties;Receptive difficulties  Cognition Arousal/Alertness: Awake/alert Behavior During Therapy: Impulsive Overall Cognitive Status: No family/caregiver present to determine baseline cognitive functioning Area of Impairment: Orientation;Safety/judgement;Problem solving;Following commands Orientation Level: Disoriented to;Place;Time;Situation     Following  Commands: Follows one step commands inconsistently;Follows one step commands with increased time Safety/Judgement: Decreased awareness of safety   Problem Solving: Requires verbal cues;Requires tactile cues;Difficulty sequencing;Slow processing General Comments: Significant speech deficits.    General Comments      Exercises        Assessment/Plan    PT Assessment Patient needs continued PT services  PT Diagnosis Difficulty walking;Generalized weakness;Altered mental status   PT Problem List Decreased strength;Pain;Decreased cognition;Decreased balance;Decreased mobility;Decreased activity tolerance;Decreased safety awareness  PT Treatment Interventions Balance training;Functional mobility training;Therapeutic activities;Therapeutic exercise;Patient/family education;Gait training;DME instruction;Neuromuscular re-education;Wheelchair mobility training;Cognitive remediation   PT Goals (Current goals can be found in the Care Plan section) Acute Rehab PT Goals Patient Stated Goal: unable to state PT Goal Formulation: With patient Time For Goal Achievement: 06/07/15 Potential to Achieve Goals: Good    Frequency Min 2X/week   Barriers to discharge        Co-evaluation               End of Session Equipment Utilized During Treatment: Gait belt Activity Tolerance: Patient tolerated treatment well Patient left: in chair;with call bell/phone within reach;with chair alarm set Nurse Communication: Mobility status    Functional Assessment Tool Used: Clinical judgement Functional Limitation: Mobility: Walking and moving around Mobility: Walking and Moving Around Current Status [phone]): At least 80 percent but less than 100 percent impaired, limited or restricted Mobility: Walking and Moving Around Goal Status [phone]): At least 20 percent but less than 40 percent impaired, limited or restricted    Time: 1432-1450 PT Time Calculation (min) (ACUTE ONLY): 18 min   Charges:    PT Evaluation $PT Eval Moderate Complexity: 1 Procedure     PT G Codes:   PT G-Codes **NOT FOR INPATIENT CLASS** Functional Assessment Tool Used: Clinical judgement Functional Limitation: Mobility: Walking and moving around Mobility: Walking and Moving Around Current Status (A2130): At least 80 percent but less than 100 percent impaired, limited or restricted Mobility: Walking and Moving Around Goal Status [phone]): At least 20 percent but less than 40 percent impaired, limited or restricted    [name] A [name] 05/24/2015, 3:09 PM Mylo Red, PT, DPT [phone]

## 2015-05-24 NOTE — Discharge Summary (Addendum)
Physician Discharge Summary  [name] ZOX:[id number] DOB: 11-07-1940 DOA: 05/21/2015  PCP: Leanor Rubenstein, MD  Admit date: 05/21/2015 Discharge date: 05/24/2015  Recommendations for Outpatient Follow-up:  1. Pt will need to follow up with PCP in 2 weeks post discharge 2. 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 Corliss Skains [phone]) spoke with pt's daughter 05/21/15 regarding aneurysm treatment and family will discuss matter further before deciding on case -after starting apixaban, the patient remained clinically stable and her CBC remained stable -Patient will take apixaban 10 mg po bid through am dose on 05/29/15;  Starting on 05/29/15 at 2200 hours, she will start apixaban 5 mg po bid -attempts x 2 were made to call pt's daughter on 05/23/15--no answer, unable to leave message;  Daughter was updated by this physician on 05/22/15 Right Knee Pain -05/22/15 xray--minimal OA, small effusion Essential Hypertension -continue lisionpril -controlled History of Stroke -continue Plavix Hyperlipidemia -continue statin Dementia with behavioral disturbance -continue Aricept CAD with hx of stent -EKG without concerning ischemic changes -no anginal symptoms -continue plavix  Discharge Condition: stable  Disposition: SNF  Diet:cardiac Wt Readings from Last 3 Encounters:  05/09/15 64.592 kg (142 lb 6.4 oz)  03/12/14 60.782 kg (134 lb)  03/09/14 61.961 kg (136 lb 9.6 oz)    History of present illness:  75 year old  female with a history of stroke, dementia, hypertension, hyperlipidemia, coronary artery disease status post stenting presented with left lower extremity pain and edema. Patient is unable to provide any history at this time secondary to her underlying dementia. All of this history is obtained from speaking with the patient's daughter and review of the medical record. Apparently, the patient had mechanical fall at rehabilitation 2 weeks prior to admission resulting in pain and swelling in the right leg and difficulty bearing weight on the right leg. Since then, the patient has not been very ambulatory. Patient denies fevers, chills, headache, chest pain, dyspnea, nausea, vomiting, diarrhea, abdominal pain, dysuria, hematuria. Patient was recently discharged from [facility] on 05/10/2015 to [facility]. During that admission, the patient was seen by neurology for worsening of her Right hemiparesis. MRI of the brain was negative for acute stroke. It was thought that the patient's unit tract infection likely contributed to the patient's worsening of her chronic right-sided hemiparesis. The patient was initially placed on [place] lovenox and was transitioned to apixaban.  She tolerated apixaban without difficulty.  Discharge Exam: Filed Vitals:   05/23/15 2012 05/24/15 0449  BP: 107/56 117/46  Pulse: 69 72  Temp: 98.6 F (37 C) 98.3 F (36.8 C)  Resp: 18 18   Filed Vitals:   05/22/15 1501 05/23/15 0607 05/23/15 2012 05/24/15 0449  BP: 115/52 123/51 107/56 117/46  Pulse:  70 69 72  Temp:  98.5 F (36.9 C) 98.6 F (37 C) 98.3 F (36.8 C)  TempSrc:  Oral Oral Oral  Resp:  SpO2:  95% 96% 91%   General: Awake and alert, NAD, pleasant, cooperative Cardiovascular: RRR, no rub, no gallop, no S3 Respiratory: CTAB, no wheeze, no rhonchi Abdomen:soft, nontender, nondistended, positive bowel sounds  Extremities: No lymphangitis, No petechiae, No rashes, no synovitis ; LLE edema--PT pulse present, calf  soft. Cap refill < 2 sec  Discharge Instructions      Discharge Instructions    Diet - low sodium heart healthy    Complete by:  As directed      Increase activity slowly    Complete by:  As directed             Medication List    STOP taking these medications        divalproex 125 MG DR tablet  Commonly known as:  DEPAKOTE      TAKE these medications        apixaban 5 MG Tabs tablet  Commonly known as:  ELIQUIS  Take 2 tablets (10 mg total) by mouth 2 (two) times daily.     apixaban 5 MG Tabs tablet  Commonly known as:  ELIQUIS  Take 1 tablet (5 mg total) by mouth 2 (two) times daily.  Start taking on:  05/29/2015     ARICEPT 10 MG tablet  Generic drug:  donepezil  TAKE 1 TABLET BY MOUTH EVERY DAY AT BEDTIME     bisacodyl 10 MG suppository  Commonly known as:  DULCOLAX  Place 10 mg rectally daily as needed for moderate constipation.     clopidogrel 75 MG tablet  Commonly known as:  PLAVIX  TAKE 1 TABLET BY MOUTH EVERY DAY     LIPITOR 40 MG tablet  Generic drug:  atorvastatin  TAKE 1 TABLET BY MOUTH EVERY DAY     lisinopril 10 MG tablet  Commonly known as:  PRINIVIL,ZESTRIL  TAKE 1 TABLET EVERY DAY     magnesium hydroxide 400 MG/5ML suspension  Commonly known as:  MILK OF MAGNESIA  Take 30 mLs by mouth daily as needed for mild constipation.     metoprolol tartrate 25 MG tablet  Commonly known as:  LOPRESSOR  Take 25 mg by mouth 2 (two) times daily.     nitroGLYCERIN 0.4 MG SL tablet  Commonly known as:  NITROSTAT  Place 1 tablet (0.4 mg total) under the tongue every 5 (five) minutes as needed for chest pain.     RA SALINE ENEMA RE  Place 1 application rectally daily as needed (constipation).         The results of significant diagnostics from this hospitalization (including imaging, microbiology, ancillary and laboratory) are listed below for reference.    Significant Diagnostic Studies: Ct Head Wo Contrast  05/08/2015  CLINICAL DATA:   Right-sided weakness and altered mental status. History of old infarct. EXAM: CT HEAD WITHOUT CONTRAST TECHNIQUE: Contiguous axial images were obtained from the base of the skull through the vertex without intravenous contrast. COMPARISON:  Brain MRI, 12/12/2013.  Head CT, 06/20/2011. FINDINGS: Ventricles are normal configuration. There is ex vacuo dilation of the posterior left lateral ventricle due to an old left sided infarct that involves the majority of the left parietal lobe posterior left temporal lobe and portions of the left occipital lobe. This is stable from the prior exams. There is no hydrocephalus. There are no parenchymal masses or mass effect. There is no evidence of a recent cortical infarct. Other patchy areas of white matter hypoattenuation are noted consistent with moderate chronic microvascular ischemic change, also stable. There are no extra-axial masses or abnormal fluid collections. There is no intracranial hemorrhage. Visualized sinuses and mastoid air cells are clear. IMPRESSION: 1. No acute intracranial abnormalities. 2. Old left posterior  MCA distribution infarct. 3. Moderate chronic microvascular ischemic change. 4. Stable appearance from the prior head CT and brain MRI. Electronically Signed   By: Amie Portland M.D.   On: 05/08/2015 20:23   Mr Maxine Glenn Head Wo Contrast  05/09/2015  CLINICAL DATA:  Larey Seat twice, worsening RIGHT-sided weakness. Unable to walk. History of hypertension, hyperlipidemia, stroke 2010 with residual mild RIGHT hemi paresis. EXAM: MRI HEAD WITHOUT CONTRAST MRA HEAD WITHOUT CONTRAST TECHNIQUE: Multiplanar, multiecho pulse sequences of the brain and surrounding structures were obtained without intravenous contrast. Angiographic images of the head were obtained using MRA technique without contrast. COMPARISON:  CT head May 07, 2014 at 2016 hours and MRI of the brain December 12, 2013 and MRA head December 01, 2009 FINDINGS: MRI HEAD FINDINGS No reduced diffusion to suggest  acute ischemia. LEFT temporal parietal occipital encephalomalacia with faint susceptibility artifact compatible with mineralization. Nonspecific punctate focus of susceptibility artifact RIGHT frontal lobe. Ex vacuo dilatation of LEFT occipital horn, ventricles and sulci are otherwise normal for patient's age. Patchy to confluent supratentorial white matter T2 hyperintensities stable from prior MRI. No midline shift, mass effect or mass lesions. Prominent perivascular spaces are unchanged. No abnormal extra-axial fluid collections. Ocular globes and orbital contents are normal. Trace paranasal sinus mucosal thickening without air-fluid levels. The mastoid air cells are well aerated. No abnormal sellar expansion. No cerebellar tonsillar ectopia. No suspicious calvarial bone marrow signal. Patient appears edentulous. MRA HEAD FINDINGS Anterior circulation: Normal flow related enhancement of the included cervical, petrous, cavernous and supraclinoid internal carotid arteries. 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 Stable appearance of 5 mm superiorly directed LEFT para ophthalmic aneurysm. Patent anterior communicating artery. Normal flow related enhancement of the anterior cerebral arteries, including distal segments. Paucity of mid to distal LEFT MCA branches compatible with old infarct. No large vessel occlusion, high-grade stenosis, abnormal luminal irregularity. Posterior circulation: RIGHT vertebral artery is dominant. Basilar artery is patent, with normal flow related enhancement of the main branch vessels. Normal flow related enhancement of the posterior cerebral arteries. Fetal origin RIGHT posterior cerebral artery. No large vessel occlusion, high-grade stenosis, abnormal luminal irregularity, aneurysm. IMPRESSION: MRI HEAD: No acute intracranial process, specifically no acute ischemia. Old large LEFT MCA territory infarct. Moderate to severe chronic  small vessel ischemic disease. MRA HEAD: No acute large vessel occlusion or high-grade stenosis. Paucity of mid to distal LEFT MCA branches compatible with old infarct. 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 Electronically Signed   By: Awilda Metro M.D.   On: 05/09/2015 01:02   Mr Brain Wo Contrast  05/09/2015  CLINICAL DATA:  Larey Seat twice, worsening RIGHT-sided weakness. Unable to walk. History of hypertension, hyperlipidemia, stroke 2010 with residual mild RIGHT hemi paresis. EXAM: MRI HEAD WITHOUT CONTRAST MRA HEAD WITHOUT CONTRAST TECHNIQUE: Multiplanar, multiecho pulse sequences of the brain and surrounding structures were obtained without intravenous contrast. Angiographic images of the head were obtained using MRA technique without contrast. COMPARISON:  CT head May 07, 2014 at 2016 hours and MRI of the brain December 12, 2013 and MRA head December 01, 2009 FINDINGS: MRI HEAD FINDINGS No reduced diffusion to suggest acute ischemia. LEFT temporal parietal occipital encephalomalacia with faint susceptibility artifact compatible with mineralization. Nonspecific punctate focus of susceptibility artifact RIGHT frontal lobe. Ex vacuo dilatation of LEFT occipital horn, ventricles and sulci are otherwise normal for patient's age. Patchy to confluent supratentorial white matter T2  hyperintensities stable from prior MRI. No midline shift, mass effect or mass lesions. Prominent perivascular spaces are unchanged. No abnormal extra-axial fluid collections. Ocular globes and orbital contents are normal. Trace paranasal sinus mucosal thickening without air-fluid levels. The mastoid air cells are well aerated. No abnormal sellar expansion. No cerebellar tonsillar ectopia. No suspicious calvarial bone marrow signal. Patient appears edentulous. MRA HEAD FINDINGS Anterior circulation: Normal flow related enhancement of the included cervical, petrous, cavernous and  supraclinoid internal carotid arteries. 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 Stable appearance of 5 mm superiorly directed LEFT para ophthalmic aneurysm. Patent anterior communicating artery. Normal flow related enhancement of the anterior cerebral arteries, including distal segments. Paucity of mid to distal LEFT MCA branches compatible with old infarct. No large vessel occlusion, high-grade stenosis, abnormal luminal irregularity. Posterior circulation: RIGHT vertebral artery is dominant. Basilar artery is patent, with normal flow related enhancement of the main branch vessels. Normal flow related enhancement of the posterior cerebral arteries. Fetal origin RIGHT posterior cerebral artery. No large vessel occlusion, high-grade stenosis, abnormal luminal irregularity, aneurysm. IMPRESSION: MRI HEAD: No acute intracranial process, specifically no acute ischemia. Old large LEFT MCA territory infarct. Moderate to severe chronic small vessel ischemic disease. MRA HEAD: No acute large vessel occlusion or high-grade stenosis. Paucity of mid to distal LEFT MCA branches compatible with old infarct. 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 Electronically Signed   By: Awilda Metro M.D.   On: 05/09/2015 01:02   Dg Knee Complete 4 Views Right  05/22/2015  CLINICAL DATA:  Acute onset of left knee pain and bruising. Initial encounter. EXAM: RIGHT KNEE - COMPLETE 4+ VIEW COMPARISON:  None. FINDINGS: There is no evidence of fracture or dislocation. The joint spaces are preserved. Marginal osteophytes are seen arising at the medial compartment, and mild osteophyte formation is noted at the tibial spine. A small knee joint effusion is noted. The visualized soft tissues are otherwise unremarkable in appearance. IMPRESSION: 1. No evidence of fracture or dislocation. 2. 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  3. Small knee joint effusion noted. Electronically Signed   By: Roanna Raider M.D.   On: 05/22/2015 01:01   Dg Foot Complete Right  05/10/2015  CLINICAL DATA:  Right foot pain. Possible fall 3-4 days ago. Midfoot bruising changes. EXAM: RIGHT FOOT COMPLETE - 3+ VIEW COMPARISON:  None. FINDINGS: Moderate midfoot degenerative changes with possible erosive findings. No acute fracture is identified. Hallux valgus deformity and degenerative change at the first metatarsal phalangeal joint. IMPRESSION: Midfoot degenerative changes but no acute fracture. Electronically Signed   By: Rudie Meyer M.D.   On: 05/10/2015 09:44     Microbiology: No results found for this or any previous visit (from the past 240 hour(s)).   Labs: Basic Metabolic Panel:  Recent Labs Lab 05/22/15 0006 05/23/15 0610  NA 140 141  K 4.0 4.0  CL 103 103  CO2 27 27  GLUCOSE 110* 103*  BUN 18 20  CREATININE 0.63 0.64  CALCIUM 9.0 9.2   Liver Function Tests: No results for input(s): AST, ALT, ALKPHOS, BILITOT, PROT, ALBUMIN in the last 168 hours. No results for input(s): LIPASE, AMYLASE in the last 168 hours. No results for input(s): AMMONIA in the last 168 hours. CBC:  Recent Labs Lab 05/22/15 0006 05/23/15 0610  WBC 9.8 9.1  HGB 13.3 13.0  HCT 38.8 38.2  MCV 96.3 96.5  PLT 178 208   Cardiac Enzymes: No results for input(s): CKTOTAL, CKMB, CKMBINDEX, TROPONINI in the last 168 hours. BNP: Invalid input(s): POCBNP CBG: No results for input(s): GLUCAP in the last 168 hours.  Time coordinating discharge:  Greater than 30 minutes  Signed:  Jontavious Commons, DO [facility] Pager: [phone] 05/24/2015, 12:01 PM

## 2015-05-24 NOTE — Progress Notes (Signed)
Informed by CSW that pt's daughter has still not chosen an available bed for d/c today. Pt remains clinically stable for discharge and discharge order in. Attempted to call pt's daughter [name]- was unable to reach daughter by phone- message left with number to call CM- in order to go over pt's observation status and ABN (Advance Beneficiary Notice of Non-coverage) also went by pt's room- daughter is not present there either. Will continue to try to reach out to daughter to assist in discharge.

## 2015-05-24 NOTE — Progress Notes (Signed)
Report given to [facility]. Patient will travel by EMS.

## 2015-05-24 NOTE — Telephone Encounter (Signed)
I agree with note from Dr. Virgina Organ

## 2015-05-24 NOTE — Progress Notes (Signed)
CSW Surveyor, quantity along with Care Management Assistant Director [name] and RN Case Manager [name] met with [name] daughter, [name]. We listened to what the daughter had to say about her experience during her mother's hospital stay. After hearing the daughter voice her concerns, a discussion was made of discharge planning. The only skilled nursing facility bed option for [name] was [facility]. After a lengthy discussion between the nursing home and family, the daughter agreed to placement at [facility]. CSW AD notified unit CSW to facilitate transfer.  Stacey Alm, LCSW Surveyor, quantity Clinical Social Work Department [employer]  [phone]

## 2015-05-24 NOTE — Progress Notes (Addendum)
Was notified by CSW that pt's daughter was at bedside- went to pt's room to speak with pt's daughter [name]- attempted to speak with daughter regarding observation stay and discharge plan.  Daughter states that she was unaware that pt was under observation, however daughter upset over hospital stay and how things have been handled and lack of communication with her regarding mother's care- does not want to discuss anything further and request to speak to someone "higher up" than the present CM. This CM let daughter know that I would go call my supervisor to come speak with her. Call made to Nicolasa Ducking RN CM- Assistant Dir. and she along with [employer]. for CSW came to speak with pt's daughter and listen to her concerns (this CM also present during conversation). After listening to daughter's concerns and much discussion,  decision was made regarding discharge for pt to go to [facility]- CSW to assist further with placement needs- no further needs from CM, ABN was not given, as daughter has agreed to discharge at this time.

## 2015-05-24 NOTE — Progress Notes (Signed)
PHARMACY - APIXABAN EDUCATION  Pharmacist attempted apixaban education with patient yesterday and was told to speak with her daughter, [name]. Patient is to discharge soon. Went by room and only patient is there. Left [name] a voicemail to discuss apixaban, awaiting a call back.  [place], [address].D., BCPS Clinical Pharmacist Pager: [phone] 05/24/2015 1:41 PM

## 2015-05-24 NOTE — Progress Notes (Signed)
CSW informed of pt dtr refusal to return to [facility] and desire to go to [facility]  CSW called at 9:30am to give bed offers and informed that [facility] did not have availability today.  Pt daughter is very upset and feels as if this was the hospital and [facility] fault for not coordinating this sooner- this morning pt dtr was not agreeable to other options for SNF stating that [facility] was only option location wise- states that she cares for a disabled sibling and for a 32 year old daughter and can not go back and forth far away to care for her mother as well- now has a great distrust of SNF believing that [employer] almost killed her  CSW spoke with [facility] again- they were unable to move beds around to accommodate pt today- they anticipate a bed for tomorrow (1/24)  CSW explained to dtr that pt has been discharged since yesterday and would need to consider alternative options.  Dtr informed of other facilities available  [facility] and would be able to admit pt this afternoon- MD spoke with pt dtr and explained that discharge is still in place- no choice by daughter has been made at this time  CSW informed supervisor of situation and they will continue to follow  Merlyn Lot, [facility] Clinical Social Worker [phone]

## 2015-05-24 NOTE — Progress Notes (Signed)
Tried to call in report to [facility] and there was no answer. Will try again.

## 2015-05-24 NOTE — Progress Notes (Signed)
PROGRESS NOTE  [name] NWG:[id number] DOB: [birth year] DOA: 05/21/2015 PCP: Leanor Rubenstein, MD  Brief History 75 year old female with a history of stroke, dementia, hypertension, hyperlipidemia, coronary artery disease status post stenting presented with left lower extremity pain and edema. Patient is unable to provide any history at this time secondary to her underlying dementia. All of this history is obtained from speaking with the patient's daughter and review of the medical record. Apparently, the patient had mechanical fall at rehabilitation 2 weeks prior to admission resulting in pain and swelling in the right leg and difficulty bearing weight on the right leg. Since then, the patient has not been very ambulatory. Patient denies fevers, chills, headache, chest pain, dyspnea, nausea, vomiting, diarrhea, abdominal pain, dysuria, hematuria. Patient was recently discharged from [facility] on 05/10/2015 to [facility]. During that admission, the patient was seen by neurology for worsening of her Right hemiparesis. MRI of the brain was negative for acute stroke. It was thought that the patient's unit tract infection likely contributed to the patient's worsening of her chronic right-sided hemiparesis. The patient was initially placed on [place] lovenox and was transitioned to apixaban. She tolerated apixaban without difficulty. 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 Corliss Skains [phone]) spoke with pt's daughter 05/21/15 regarding aneurysm treatment and family will  discuss matter further before deciding on case -after starting apixaban, the patient remained clinically stable and her CBC remained stable -Patient will take apixaban 10 mg po bid through am dose on 05/29/15; Starting on 05/29/15 at 2200 hours, she will start apixaban 5 mg po bid Right Knee Pain -05/22/15 xray--minimal OA, small effusion Essential Hypertension -continue lisionpril -controlled History of Stroke -continue Plavix Hyperlipidemia -continue statin Dementia with behavioral disturbance -continue Aricept CAD with hx of stent -EKG without concerning ischemic changes -no anginal symptoms -continue plavix   Family Communication:   Pt at beside Disposition Plan:   SNF when bed available     Procedures/Studies: Ct Head Wo Contrast  05/08/2015  CLINICAL DATA:  Right-sided weakness and altered mental status. History of old infarct. EXAM: CT HEAD WITHOUT CONTRAST TECHNIQUE: Contiguous axial images were obtained from the base of the skull through the vertex without intravenous contrast. COMPARISON:  Brain MRI, 12/12/2013.  Head CT, 06/20/2011. FINDINGS: Ventricles are normal configuration. There is ex vacuo dilation of the posterior left lateral ventricle due to an old left sided infarct that involves the majority of the left parietal lobe posterior left temporal lobe and portions of the left occipital lobe. This is stable from the prior exams. There is no hydrocephalus. There are no parenchymal masses or mass effect. There is no evidence of a recent cortical infarct. Other patchy areas of white matter hypoattenuation are noted consistent with moderate chronic microvascular ischemic change, also stable. There are no extra-axial masses or abnormal fluid collections. There is no intracranial hemorrhage. Visualized sinuses and mastoid air cells are clear. IMPRESSION: 1. No acute intracranial abnormalities. 2. Old left posterior MCA distribution infarct. 3. Moderate chronic microvascular  ischemic change. 4. Stable appearance from the prior head CT and brain MRI. Electronically Signed  By: Amie Portland M.D.   On: 05/08/2015 20:23   Mr Maxine Glenn Head Wo Contrast  05/09/2015  CLINICAL DATA:  Larey Seat twice, worsening RIGHT-sided weakness. Unable to walk. History of hypertension, hyperlipidemia, stroke 2010 with residual mild RIGHT hemi paresis. EXAM: MRI HEAD WITHOUT CONTRAST MRA HEAD WITHOUT CONTRAST TECHNIQUE: Multiplanar, multiecho pulse sequences of the brain and surrounding structures were obtained without intravenous contrast. Angiographic images of the head were obtained using MRA technique without contrast. COMPARISON:  CT head May 07, 2014 at 2016 hours and MRI of the brain December 12, 2013 and MRA head December 01, 2009 FINDINGS: MRI HEAD FINDINGS No reduced diffusion to suggest acute ischemia. LEFT temporal parietal occipital encephalomalacia with faint susceptibility artifact compatible with mineralization. Nonspecific punctate focus of susceptibility artifact RIGHT frontal lobe. Ex vacuo dilatation of LEFT occipital horn, ventricles and sulci are otherwise normal for patient's age. Patchy to confluent supratentorial white matter T2 hyperintensities stable from prior MRI. No midline shift, mass effect or mass lesions. Prominent perivascular spaces are unchanged. No abnormal extra-axial fluid collections. Ocular globes and orbital contents are normal. Trace paranasal sinus mucosal thickening without air-fluid levels. The mastoid air cells are well aerated. No abnormal sellar expansion. No cerebellar tonsillar ectopia. No suspicious calvarial bone marrow signal. Patient appears edentulous. MRA HEAD FINDINGS Anterior circulation: Normal flow related enhancement of the included cervical, petrous, cavernous and supraclinoid internal carotid arteries. 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 Stable appearance of 5 mm superiorly directed LEFT para  ophthalmic aneurysm. Patent anterior communicating artery. Normal flow related enhancement of the anterior cerebral arteries, including distal segments. Paucity of mid to distal LEFT MCA branches compatible with old infarct. No large vessel occlusion, high-grade stenosis, abnormal luminal irregularity. Posterior circulation: RIGHT vertebral artery is dominant. Basilar artery is patent, with normal flow related enhancement of the main branch vessels. Normal flow related enhancement of the posterior cerebral arteries. Fetal origin RIGHT posterior cerebral artery. No large vessel occlusion, high-grade stenosis, abnormal luminal irregularity, aneurysm. IMPRESSION: MRI HEAD: No acute intracranial process, specifically no acute ischemia. Old large LEFT MCA territory infarct. Moderate to severe chronic small vessel ischemic disease. MRA HEAD: No acute large vessel occlusion or high-grade stenosis. Paucity of mid to distal LEFT MCA branches compatible with old infarct. 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 Electronically Signed   By: Awilda Metro M.D.   On: 05/09/2015 01:02   Mr Brain Wo Contrast  05/09/2015  CLINICAL DATA:  Larey Seat twice, worsening RIGHT-sided weakness. Unable to walk. History of hypertension, hyperlipidemia, stroke 2010 with residual mild RIGHT hemi paresis. EXAM: MRI HEAD WITHOUT CONTRAST MRA HEAD WITHOUT CONTRAST TECHNIQUE: Multiplanar, multiecho pulse sequences of the brain and surrounding structures were obtained without intravenous contrast. Angiographic images of the head were obtained using MRA technique without contrast. COMPARISON:  CT head May 07, 2014 at 2016 hours and MRI of the brain December 12, 2013 and MRA head December 01, 2009 FINDINGS: MRI HEAD FINDINGS No reduced diffusion to suggest acute ischemia. LEFT temporal parietal occipital encephalomalacia with faint susceptibility artifact compatible with mineralization. Nonspecific  punctate focus of susceptibility artifact RIGHT frontal lobe. Ex vacuo dilatation of LEFT occipital horn, ventricles and sulci are otherwise normal for patient's age. Patchy to confluent supratentorial white matter T2 hyperintensities stable from prior MRI. No midline shift, mass effect or mass lesions. Prominent perivascular spaces are unchanged. No abnormal extra-axial fluid collections. Ocular  globes and orbital contents are normal. Trace paranasal sinus mucosal thickening without air-fluid levels. The mastoid air cells are well aerated. No abnormal sellar expansion. No cerebellar tonsillar ectopia. No suspicious calvarial bone marrow signal. Patient appears edentulous. MRA HEAD FINDINGS Anterior circulation: Normal flow related enhancement of the included cervical, petrous, cavernous and supraclinoid internal carotid arteries. 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 Stable appearance of 5 mm superiorly directed LEFT para ophthalmic aneurysm. Patent anterior communicating artery. Normal flow related enhancement of the anterior cerebral arteries, including distal segments. Paucity of mid to distal LEFT MCA branches compatible with old infarct. No large vessel occlusion, high-grade stenosis, abnormal luminal irregularity. Posterior circulation: RIGHT vertebral artery is dominant. Basilar artery is patent, with normal flow related enhancement of the main branch vessels. Normal flow related enhancement of the posterior cerebral arteries. Fetal origin RIGHT posterior cerebral artery. No large vessel occlusion, high-grade stenosis, abnormal luminal irregularity, aneurysm. IMPRESSION: MRI HEAD: No acute intracranial process, specifically no acute ischemia. Old large LEFT MCA territory infarct. Moderate to severe chronic small vessel ischemic disease. MRA HEAD: No acute large vessel occlusion or high-grade stenosis. Paucity of mid to distal LEFT MCA branches  compatible with old infarct. 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 Electronically Signed   By: Awilda Metro M.D.   On: 05/09/2015 01:02   Dg Knee Complete 4 Views Right  05/22/2015  CLINICAL DATA:  Acute onset of left knee pain and bruising. Initial encounter. EXAM: RIGHT KNEE - COMPLETE 4+ VIEW COMPARISON:  None. FINDINGS: There is no evidence of fracture or dislocation. The joint spaces are preserved. Marginal osteophytes are seen arising at the medial compartment, and mild osteophyte formation is noted at the tibial spine. A small knee joint effusion is noted. The visualized soft tissues are otherwise unremarkable in appearance. IMPRESSION: 1. No evidence of fracture or dislocation. 2. 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 3. Small knee joint effusion noted. Electronically Signed   By: Roanna Raider M.D.   On: 05/22/2015 01:01   Dg Foot Complete Right  05/10/2015  CLINICAL DATA:  Right foot pain. Possible fall 3-4 days ago. Midfoot bruising changes. EXAM: RIGHT FOOT COMPLETE - 3+ VIEW COMPARISON:  None. FINDINGS: Moderate midfoot degenerative changes with possible erosive findings. No acute fracture is identified. Hallux valgus deformity and degenerative change at the first metatarsal phalangeal joint. IMPRESSION: Midfoot degenerative changes but no acute fracture. Electronically Signed   By: Rudie Meyer M.D.   On: 05/10/2015 09:44         Subjective: Patient denies fevers, chills, headache, chest pain, dyspnea, nausea, vomiting, diarrhea, abdominal pain, dysuria, hematuria   Objective: Filed Vitals:   05/22/15 1501 05/23/15 0607 05/23/15 2012 05/24/15 0449  BP: 115/52 123/51 107/56 117/46  Pulse:  70 69 72  Temp:  98.5 F (36.9 C) 98.6 F (37 C) 98.3 F (36.8 C)  TempSrc:  Oral Oral Oral  Resp:  18 18 18   SpO2:  95% 96% 91%   No intake or output data in the 24 hours ending 05/24/15 1156 Weight change:    Exam:   General:  Pt is alert, follows commands appropriately, not in acute distress  HEENT: No icterus, No thrush, No neck mass, [place]/AT  Cardiovascular: RRR, S1/S2, no rubs, no gallops  Respiratory: CTA bilaterally, no wheezing, no crackles, no rhonchi  Abdomen: Soft/+BS, non tender, non distended, no guarding  Extremities:  No lymphangitis, No petechiae, No rashes, no synovitis ; LLE edema--PT pulse present, calf soft.  Cap refill < 2 sec  Data Reviewed: Basic Metabolic Panel:  Recent Labs Lab 05/22/15 0006 05/23/15 0610  NA 140 141  K 4.0 4.0  CL 103 103  CO2 27 27  GLUCOSE 110* 103*  BUN 18 20  CREATININE 0.63 0.64  CALCIUM 9.0 9.2   Liver Function Tests: No results for input(s): AST, ALT, ALKPHOS, BILITOT, PROT, ALBUMIN in the last 168 hours. No results for input(s): LIPASE, AMYLASE in the last 168 hours. No results for input(s): AMMONIA in the last 168 hours. CBC:  Recent Labs Lab 05/22/15 0006 05/23/15 0610  WBC 9.8 9.1  HGB 13.3 13.0  HCT 38.8 38.2  MCV 96.3 96.5  PLT 178 208   Cardiac Enzymes: No results for input(s): CKTOTAL, CKMB, CKMBINDEX, TROPONINI in the last 168 hours. BNP: Invalid input(s): POCBNP CBG: No results for input(s): GLUCAP in the last 168 hours.  No results found for this or any previous visit (from the past 240 hour(s)).   Scheduled Meds: . apixaban  10 mg Oral BID   Followed by  . [START ON 05/29/2015] apixaban  5 mg Oral BID  . atorvastatin  40 mg Oral Daily  . clopidogrel  75 mg Oral Daily  . donepezil  10 mg Oral QHS  . lisinopril  10 mg Oral Daily  . sodium chloride  3 mL Intravenous Q12H   Continuous Infusions:    Kelvyn Schunk, DO  [facility] Pager [phone]  If 7PM-7AM, please contact night-coverage [email or web address] Password [email or web address] 05/24/2015, 11:56 AM

## 2015-05-25 ENCOUNTER — Non-Acute Institutional Stay (SKILLED_NURSING_FACILITY): Payer: Medicare Other | Admitting: Internal Medicine

## 2015-05-25 ENCOUNTER — Encounter: Payer: Self-pay | Admitting: Internal Medicine

## 2015-05-25 DIAGNOSIS — F03918 Unspecified dementia, unspecified severity, with other behavioral disturbance: Secondary | ICD-10-CM

## 2015-05-25 DIAGNOSIS — I671 Cerebral aneurysm, nonruptured: Secondary | ICD-10-CM | POA: Diagnosis not present

## 2015-05-25 DIAGNOSIS — I10 Essential (primary) hypertension: Secondary | ICD-10-CM

## 2015-05-25 DIAGNOSIS — Z8673 Personal history of transient ischemic attack (TIA), and cerebral infarction without residual deficits: Secondary | ICD-10-CM

## 2015-05-25 DIAGNOSIS — I82412 Acute embolism and thrombosis of left femoral vein: Secondary | ICD-10-CM

## 2015-05-25 DIAGNOSIS — E785 Hyperlipidemia, unspecified: Secondary | ICD-10-CM | POA: Diagnosis not present

## 2015-05-25 DIAGNOSIS — I25118 Atherosclerotic heart disease of native coronary artery with other forms of angina pectoris: Secondary | ICD-10-CM

## 2015-05-25 DIAGNOSIS — M1711 Unilateral primary osteoarthritis, right knee: Secondary | ICD-10-CM

## 2015-05-25 DIAGNOSIS — F0391 Unspecified dementia with behavioral disturbance: Secondary | ICD-10-CM | POA: Diagnosis not present

## 2015-05-25 NOTE — Progress Notes (Signed)
Patient ID: [name], female   DOB: 09/06/1940, 75 y.o.   MRN: [id number]    HISTORY AND PHYSICAL   DATE: 05/25/15  Location:  [facility]    Place of Service: SNF [phone])   Extended Emergency Contact Information Primary Emergency Contact: [place] Phone: [phone] Relation: Daughter  Advanced Directive information  FULL CODE  Chief Complaint  Patient presents with  . New Admit To SNF    HPI:  75 yo female seen today as a new admission into SNF following hospital stay for acute LLE DVT. LLE venous doppler US revealed extensive acute DVT involving saphenofemoral junction, common femoral, and proximal femoral veins. She was started on lovenox ---> eliquis. She was determined to not be a candidate for thrombolytics due to paraophthalmic aneurysms, dementia with previous stroke. Neurology consulted foe aneurysm tx options and family will discuss further.   She is a poor historian due to dementia. Hx obtained from chart. She is total care. No nursing issues. No falls.  HTN - BP stable on lisinopril  Hx CVA - stable on plavix  Dementia - stable on aricept  CAD - s/p stent. She takes plavix. No CP. Takes lipitor for cholesterol   Knee pain, right - she had xray during hospital stay which showed min OA and small effusion  Past Medical History  Diagnosis Date  . Hypertension   . Hyperlipidemia   . Coronary artery disease 2000    STATUS POT PTCA ND STENTING OF THE LAD   . CVA (cerebrovascular accident) ([place]) 2010    Past Surgical History  Procedure Laterality Date  . None      Patient Care Team: Donald Prose, MD as PCP - General (Family Medicine)  Social History   Social History  . Marital Status: Widowed    Spouse Name: N/A  . Number of Children: 4  . Years of Education: 12   Occupational History  . Not on file.   Social History Main Topics  . Smoking status: Former Smoker    Quit date: 05/01/2008  .  Smokeless tobacco: Never Used  . Alcohol Use: No  . Drug Use: No  . Sexual Activity: Not on file   Other Topics Concern  . Not on file   Social History Narrative   Patient is single, with 4 living children, and 2 deceased children   Patient is left handed   Patient has her GED   Patient drinks 2 cups daily     reports that she quit smoking about 7 years ago. She has never used smokeless tobacco. She reports that she does not drink alcohol or use illicit drugs.  Family History  Problem Relation Age of Onset  . Alzheimer's disease Mother    Family Status  Relation Status Death Age  . Mother Alive   . Father Deceased      There is no immunization history on file for this patient.  Allergies  Allergen Reactions  . Seroquel [Quetiapine Fumarate] Other (See Comments)    Next day after taking medication, patient was hard to wake up from her sleep    Medications: Patient's Medications  New Prescriptions   No medications on file  Previous Medications   APIXABAN (ELIQUIS) 5 MG TABS TABLET    Take 2 tablets (10 mg total) by mouth 2 (two) times daily.   APIXABAN (ELIQUIS) 5 MG TABS TABLET    Take 1 tablet (5 mg total) by mouth 2 (  two) times daily.   ARICEPT 10 MG TABLET    TAKE 1 TABLET BY MOUTH EVERY DAY AT BEDTIME   BISACODYL (DULCOLAX) 10 MG SUPPOSITORY    Place 10 mg rectally daily as needed for moderate constipation.   CLOPIDOGREL (PLAVIX) 75 MG TABLET    TAKE 1 TABLET BY MOUTH EVERY DAY   LIPITOR 40 MG TABLET    TAKE 1 TABLET BY MOUTH EVERY DAY   LISINOPRIL (PRINIVIL,ZESTRIL) 10 MG TABLET    TAKE 1 TABLET EVERY DAY   MAGNESIUM HYDROXIDE (MILK OF MAGNESIA) 400 MG/5ML SUSPENSION    Take 30 mLs by mouth daily as needed for mild constipation.   METOPROLOL TARTRATE (LOPRESSOR) 25 MG TABLET    Take 25 mg by mouth 2 (two) times daily.   NITROGLYCERIN (NITROSTAT) 0.4 MG SL TABLET    Place 1 tablet (0.4 mg total) under the tongue every 5 (five) minutes as needed for chest pain.    SODIUM PHOSPHATES (RA SALINE ENEMA RE)    Place 1 application rectally daily as needed (constipation).  Modified Medications   No medications on file  Discontinued Medications   No medications on file    Review of Systems  Unable to perform ROS: Dementia    Filed Vitals:   05/25/15 1222  BP: 122/53  Pulse: 70  Temp: 97.5 F (36.4 C)  Weight: 137 lb 1.6 oz (62.188 kg)  SpO2: 97%   Body mass index is 24.29 kg/(m^2).  Physical Exam  Constitutional: She appears well-developed.  Frail appearing in NAD  HENT:  Mouth/Throat: Oropharynx is clear and moist. No oropharyngeal exudate.  Eyes: Pupils are equal, round, and reactive to light. No scleral icterus.  Neck: Neck supple. Carotid bruit is not present. No tracheal deviation present.  Cardiovascular: Normal rate, regular rhythm and intact distal pulses.  Exam reveals no gallop and no friction rub.   Murmur (1/6 SEM) heard. Left calf swelling with palpable cords. B/l calf TTP. Reduced left DP pulse. Trace LLE swelling. No RLE edema.  Pulmonary/Chest: Effort normal and breath sounds normal. No stridor. No respiratory distress. She has no wheezes. She has no rales.  Abdominal: Soft. Bowel sounds are normal. She exhibits no distension and no mass. There is no hepatomegaly. There is no tenderness. There is no rebound and no guarding.  Musculoskeletal: She exhibits edema and tenderness.  Lymphadenopathy:    She has no cervical adenopathy.  Neurological: She is alert.  Skin: Skin is warm and dry. No rash noted.  Psychiatric: She has a normal mood and affect. Her behavior is normal.     Labs reviewed: Admission on 05/21/2015, Discharged on 05/24/2015  Component Date Value Ref Range Status  . WBC 05/22/2015 9.8  4.0 - 10.5 K/uL Final  . RBC 05/22/2015 4.03  3.87 - 5.11 MIL/uL Final  . Hemoglobin 05/22/2015 13.3  12.0 - 15.0 g/dL Final  . HCT 05/22/2015 38.8  36.0 - 46.0 % Final  . MCV 05/22/2015 96.3  78.0 - 100.0 fL Final  . MCH  05/22/2015 33.0  26.0 - 34.0 pg Final  . MCHC 05/22/2015 34.3  30.0 - 36.0 g/dL Final  . RDW 05/22/2015 12.7  11.5 - 15.5 % Final  . Platelets 05/22/2015 178  150 - 400 K/uL Final  . Sodium 05/22/2015 140  135 - 145 mmol/L Final  . Potassium 05/22/2015 4.0  3.5 - 5.1 mmol/L Final  . Chloride 05/22/2015 103  101 - 111 mmol/L Final  . CO2 05/22/2015 27  22 - 32 mmol/L Final  . Glucose, Bld 05/22/2015 110* 65 - 99 mg/dL Final  . BUN 52/70/7031 18  6 - 20 mg/dL Final  . Creatinine, Ser 05/22/2015 0.63  0.44 - 1.00 mg/dL Final  . Calcium 78/18/9485 9.0  8.9 - 10.3 mg/dL Final  . GFR calc non Af Amer 05/22/2015 >60  >60 mL/min Final  . GFR calc Af Amer 05/22/2015 >60  >60 mL/min Final   Comment: (NOTE) The eGFR has been calculated using the CKD EPI equation. This calculation has not been validated in all clinical situations. eGFR's persistently <60 mL/min signify possible Chronic Kidney Disease.   . Anion gap 05/22/2015 10  5 - 15 Final  . WBC 05/23/2015 9.1  4.0 - 10.5 K/uL Final  . RBC 05/23/2015 3.96  3.87 - 5.11 MIL/uL Final  . Hemoglobin 05/23/2015 13.0  12.0 - 15.0 g/dL Final  . HCT 39/03/3450 38.2  36.0 - 46.0 % Final  . MCV 05/23/2015 96.5  78.0 - 100.0 fL Final  . MCH 05/23/2015 32.8  26.0 - 34.0 pg Final  . MCHC 05/23/2015 34.0  30.0 - 36.0 g/dL Final  . RDW 88/59/4396 12.4  11.5 - 15.5 % Final  . Platelets 05/23/2015 208  150 - 400 K/uL Final  . Sodium 05/23/2015 141  135 - 145 mmol/L Final  . Potassium 05/23/2015 4.0  3.5 - 5.1 mmol/L Final  . Chloride 05/23/2015 103  101 - 111 mmol/L Final  . CO2 05/23/2015 27  22 - 32 mmol/L Final  . Glucose, Bld 05/23/2015 103* 65 - 99 mg/dL Final  . BUN 21/81/9682 20  6 - 20 mg/dL Final  . Creatinine, Ser 05/23/2015 0.64  0.44 - 1.00 mg/dL Final  . Calcium 79/96/5738 9.2  8.9 - 10.3 mg/dL Final  . GFR calc non Af Amer 05/23/2015 >60  >60 mL/min Final  . GFR calc Af Amer 05/23/2015 >60  >60 mL/min Final   Comment: (NOTE) The eGFR has  been calculated using the CKD EPI equation. This calculation has not been validated in all clinical situations. eGFR's persistently <60 mL/min signify possible Chronic Kidney Disease.   . Anion gap 05/23/2015 11  5 - 15 Final  Admission on 05/08/2015, Discharged on 05/10/2015  Component Date Value Ref Range Status  . Alcohol, Ethyl (B) 05/08/2015 <5  <5 mg/dL Final   Comment:        LOWEST DETECTABLE LIMIT FOR SERUM ALCOHOL IS 5 mg/dL FOR MEDICAL PURPOSES ONLY   . Prothrombin Time 05/08/2015 13.9  11.6 - 15.2 seconds Final  . INR 05/08/2015 1.05  0.00 - 1.49 Final  . aPTT 05/08/2015 28  24 - 37 seconds Final  . WBC 05/08/2015 10.2  4.0 - 10.5 K/uL Final  . RBC 05/08/2015 4.21  3.87 - 5.11 MIL/uL Final  . Hemoglobin 05/08/2015 13.9  12.0 - 15.0 g/dL Final  . HCT 05/30/1749 40.4  36.0 - 46.0 % Final  . MCV 05/08/2015 96.0  78.0 - 100.0 fL Final  . MCH 05/08/2015 33.0  26.0 - 34.0 pg Final  . MCHC 05/08/2015 34.4  30.0 - 36.0 g/dL Final  . RDW 20/47/3725 12.6  11.5 - 15.5 % Final  . Platelets 05/08/2015 180  150 - 400 K/uL Final  . Neutrophils Relative % 05/08/2015 74   Final  . Neutro Abs 05/08/2015 7.6  1.7 - 7.7 K/uL Final  . Lymphocytes Relative 05/08/2015 18   Final  . Lymphs Abs 05/08/2015 1.9  0.7 -  4.0 K/uL Final  . Monocytes Relative 05/08/2015 6   Final  . Monocytes Absolute 05/08/2015 0.6  0.1 - 1.0 K/uL Final  . Eosinophils Relative 05/08/2015 2   Final  . Eosinophils Absolute 05/08/2015 0.2  0.0 - 0.7 K/uL Final  . Basophils Relative 05/08/2015 0   Final  . Basophils Absolute 05/08/2015 0.0  0.0 - 0.1 K/uL Final  . Sodium 05/08/2015 143  135 - 145 mmol/L Final  . Potassium 05/08/2015 3.8  3.5 - 5.1 mmol/L Final  . Chloride 05/08/2015 109  101 - 111 mmol/L Final  . CO2 05/08/2015 24  22 - 32 mmol/L Final  . Glucose, Bld 05/08/2015 110* 65 - 99 mg/dL Final  . BUN 05/08/2015 15  6 - 20 mg/dL Final  . Creatinine, Ser 05/08/2015 0.64  0.44 - 1.00 mg/dL Final  . Calcium  05/08/2015 9.4  8.9 - 10.3 mg/dL Final  . Total Protein 05/08/2015 5.9* 6.5 - 8.1 g/dL Final  . Albumin 05/08/2015 3.7  3.5 - 5.0 g/dL Final  . AST 05/08/2015 29  15 - 41 U/L Final  . ALT 05/08/2015 20  14 - 54 U/L Final  . Alkaline Phosphatase 05/08/2015 70  38 - 126 U/L Final  . Total Bilirubin 05/08/2015 0.6  0.3 - 1.2 mg/dL Final  . GFR calc non Af Amer 05/08/2015 >60  >60 mL/min Final  . GFR calc Af Amer 05/08/2015 >60  >60 mL/min Final   Comment: (NOTE) The eGFR has been calculated using the CKD EPI equation. This calculation has not been validated in all clinical situations. eGFR's persistently <60 mL/min signify possible Chronic Kidney Disease.   . Anion gap 05/08/2015 10  5 - 15 Final  . Troponin i, poc 05/08/2015 0.00  0.00 - 0.08 ng/mL Final  . Comment 3 05/08/2015          Final   Comment: Due to the release kinetics of cTnI, a negative result within the first hours of the onset of symptoms does not rule out myocardial infarction with certainty. If myocardial infarction is still suspected, repeat the test at appropriate intervals.   . Opiates 05/09/2015 NONE DETECTED  NONE DETECTED Final  . Cocaine 05/09/2015 NONE DETECTED  NONE DETECTED Final  . Benzodiazepines 05/09/2015 NONE DETECTED  NONE DETECTED Final  . Amphetamines 05/09/2015 NONE DETECTED  NONE DETECTED Final  . Tetrahydrocannabinol 05/09/2015 NONE DETECTED  NONE DETECTED Final  . Barbiturates 05/09/2015 NONE DETECTED  NONE DETECTED Final   Comment:        DRUG SCREEN FOR MEDICAL PURPOSES ONLY.  IF CONFIRMATION IS NEEDED FOR ANY PURPOSE, NOTIFY LAB WITHIN 5 DAYS.        LOWEST DETECTABLE LIMITS FOR URINE DRUG SCREEN Drug Class       Cutoff (ng/mL) Amphetamine      1000 Barbiturate      200 Benzodiazepine   144 Tricyclics       818 Opiates          300 Cocaine          300 THC              50   . Color, Urine 05/09/2015 YELLOW  YELLOW Final  . APPearance 05/09/2015 CLOUDY* CLEAR Final  . Specific  Gravity, Urine 05/09/2015 1.023  1.005 - 1.030 Final  . pH 05/09/2015 5.0  5.0 - 8.0 Final  . Glucose, UA 05/09/2015 NEGATIVE  NEGATIVE mg/dL Final  . Hgb urine dipstick 05/09/2015 SMALL* NEGATIVE Final  .  Bilirubin Urine 05/09/2015 NEGATIVE  NEGATIVE Final  . Ketones, ur 05/09/2015 NEGATIVE  NEGATIVE mg/dL Final  . Protein, ur 05/09/2015 NEGATIVE  NEGATIVE mg/dL Final  . Nitrite 05/09/2015 POSITIVE* NEGATIVE Final  . Leukocytes, UA 05/09/2015 MODERATE* NEGATIVE Final  . Hgb A1c MFr Bld 05/08/2015 6.0* 4.8 - 5.6 % Final   Comment: (NOTE)         Pre-diabetes: 5.7 - 6.4         Diabetes: >6.4         Glycemic control for adults with diabetes: <7.0   . Mean Plasma Glucose 05/08/2015 126   Final   Comment: (NOTE) Performed At: [facility] [place], [place] [id number] Lindon Romp MD AX:[id number]   . Cholesterol 05/09/2015 140  0 - 200 mg/dL Final  . Triglycerides 05/09/2015 70  <150 mg/dL Final  . HDL 05/09/2015 60  >40 mg/dL Final  . Total CHOL/HDL Ratio 05/09/2015 2.3   Final  . VLDL 05/09/2015 14  0 - 40 mg/dL Final  . LDL Cholesterol 05/09/2015 66  0 - 99 mg/dL Final   Comment:        Total Cholesterol/HDL:CHD Risk Coronary Heart Disease Risk Table                     Men   Women  1/2 Average Risk   3.4   3.3  Average Risk       5.0   4.4  2 X Average Risk   9.6   7.1  3 X Average Risk  23.4   11.0        Use the calculated Patient Ratio above and the CHD Risk Table to determine the patient's CHD Risk.        ATP III CLASSIFICATION (LDL):  <100     mg/dL   Optimal  100-129  mg/dL   Near or Above                    Optimal  130-159  mg/dL   Borderline  160-189  mg/dL   High  >190     mg/dL   Very High   . Hgb A1c MFr Bld 05/09/2015 6.0* 4.8 - 5.6 % Final   Comment: (NOTE)         Pre-diabetes: 5.7 - 6.4         Diabetes: >6.4         Glycemic control for adults with diabetes: <7.0   . Mean Plasma Glucose 05/09/2015 126   Final    Comment: (NOTE) Performed At: [facility] [place], [place] [id number] Lindon Romp MD OP:[id number]   . Cholesterol 05/09/2015 139  0 - 200 mg/dL Final  . Triglycerides 05/09/2015 62  <150 mg/dL Final  . HDL 05/09/2015 61  >40 mg/dL Final  . Total CHOL/HDL Ratio 05/09/2015 2.3   Final  . VLDL 05/09/2015 12  0 - 40 mg/dL Final  . LDL Cholesterol 05/09/2015 66  0 - 99 mg/dL Final   Comment:        Total Cholesterol/HDL:CHD Risk Coronary Heart Disease Risk Table                     Men   Women  1/2 Average Risk   3.4   3.3  Average Risk       5.0   4.4  2 X Average Risk   9.6  7.1  3 X Average Risk  23.4   11.0        Use the calculated Patient Ratio above and the CHD Risk Table to determine the patient's CHD Risk.        ATP III CLASSIFICATION (LDL):  <100     mg/dL   Optimal  100-129  mg/dL   Near or Above                    Optimal  130-159  mg/dL   Borderline  160-189  mg/dL   High  >190     mg/dL   Very High   . Squamous Epithelial / LPF 05/09/2015 0-5* NONE SEEN Final  . WBC, UA 05/09/2015 6-30  0 - 5 WBC/hpf Final  . RBC / HPF 05/09/2015 NONE SEEN  0 - 5 RBC/hpf Final  . Bacteria, UA 05/09/2015 MANY* NONE SEEN Final  . TSH 05/09/2015 1.845  0.350 - 4.500 uIU/mL Final  . Glucose-Capillary 05/09/2015 83  65 - 99 mg/dL Final  . Comment 1 05/09/2015 Notify RN   Final  . Comment 2 05/09/2015 Document in Chart   Final  . Glucose-Capillary 05/10/2015 104* 65 - 99 mg/dL Final  . Comment 1 05/10/2015 Notify RN   Final  . Comment 2 05/10/2015 Document in Chart   Final    Ct Head Wo Contrast  05/08/2015  CLINICAL DATA:  Right-sided weakness and altered mental status. History of old infarct. EXAM: CT HEAD WITHOUT CONTRAST TECHNIQUE: Contiguous axial images were obtained from the base of the skull through the vertex without intravenous contrast. COMPARISON:  Brain MRI, 12/12/2013.  Head CT, 06/20/2011. FINDINGS: Ventricles are normal configuration.  There is ex vacuo dilation of the posterior left lateral ventricle due to an old left sided infarct that involves the majority of the left parietal lobe posterior left temporal lobe and portions of the left occipital lobe. This is stable from the prior exams. There is no hydrocephalus. There are no parenchymal masses or mass effect. There is no evidence of a recent cortical infarct. Other patchy areas of white matter hypoattenuation are noted consistent with moderate chronic microvascular ischemic change, also stable. There are no extra-axial masses or abnormal fluid collections. There is no intracranial hemorrhage. Visualized sinuses and mastoid air cells are clear. IMPRESSION: 1. No acute intracranial abnormalities. 2. Old left posterior MCA distribution infarct. 3. Moderate chronic microvascular ischemic change. 4. Stable appearance from the prior head CT and brain MRI. Electronically Signed   By: Lajean Manes M.D.   On: 05/08/2015 20:23   Mr Jodene Nam Head Wo Contrast  05/09/2015  CLINICAL DATA:  [place] twice, worsening RIGHT-sided weakness. Unable to walk. History of hypertension, hyperlipidemia, stroke 2010 with residual mild RIGHT hemi paresis. EXAM: MRI HEAD WITHOUT CONTRAST MRA HEAD WITHOUT CONTRAST TECHNIQUE: Multiplanar, multiecho pulse sequences of the brain and surrounding structures were obtained without intravenous contrast. Angiographic images of the head were obtained using MRA technique without contrast. COMPARISON:  CT head May 07, 2014 at 2016 hours and MRI of the brain December 12, 2013 and MRA head December 01, 2009 FINDINGS: MRI HEAD FINDINGS No reduced diffusion to suggest acute ischemia. LEFT temporal parietal occipital encephalomalacia with faint susceptibility artifact compatible with mineralization. Nonspecific punctate focus of susceptibility artifact RIGHT frontal lobe. Ex vacuo dilatation of LEFT occipital horn, ventricles and sulci are otherwise normal for patient's age. Patchy to confluent  supratentorial white matter T2 hyperintensities stable from prior MRI. No midline shift,  mass effect or mass lesions. Prominent perivascular spaces are unchanged. No abnormal extra-axial fluid collections. Ocular globes and orbital contents are normal. Trace paranasal sinus mucosal thickening without air-fluid levels. The mastoid air cells are well aerated. No abnormal sellar expansion. No cerebellar tonsillar ectopia. No suspicious calvarial bone marrow signal. Patient appears edentulous. MRA HEAD FINDINGS Anterior circulation: Normal flow related enhancement of the included cervical, petrous, cavernous and supraclinoid internal carotid arteries. 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 Stable appearance of 5 mm superiorly directed LEFT para ophthalmic aneurysm. Patent anterior communicating artery. Normal flow related enhancement of the anterior cerebral arteries, including distal segments. Paucity of mid to distal LEFT MCA branches compatible with old infarct. No large vessel occlusion, high-grade stenosis, abnormal luminal irregularity. Posterior circulation: RIGHT vertebral artery is dominant. Basilar artery is patent, with normal flow related enhancement of the main branch vessels. Normal flow related enhancement of the posterior cerebral arteries. Fetal origin RIGHT posterior cerebral artery. No large vessel occlusion, high-grade stenosis, abnormal luminal irregularity, aneurysm. IMPRESSION: MRI HEAD: No acute intracranial process, specifically no acute ischemia. Old large LEFT MCA territory infarct. Moderate to severe chronic small vessel ischemic disease. MRA HEAD: No acute large vessel occlusion or high-grade stenosis. Paucity of mid to distal LEFT MCA branches compatible with old infarct. 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 Electronically Signed   By: Elon Alas M.D.   On: 05/09/2015  01:02   Mr Brain Wo Contrast  05/09/2015  CLINICAL DATA:  [place] twice, worsening RIGHT-sided weakness. Unable to walk. History of hypertension, hyperlipidemia, stroke 2010 with residual mild RIGHT hemi paresis. EXAM: MRI HEAD WITHOUT CONTRAST MRA HEAD WITHOUT CONTRAST TECHNIQUE: Multiplanar, multiecho pulse sequences of the brain and surrounding structures were obtained without intravenous contrast. Angiographic images of the head were obtained using MRA technique without contrast. COMPARISON:  CT head May 07, 2014 at 2016 hours and MRI of the brain December 12, 2013 and MRA head December 01, 2009 FINDINGS: MRI HEAD FINDINGS No reduced diffusion to suggest acute ischemia. LEFT temporal parietal occipital encephalomalacia with faint susceptibility artifact compatible with mineralization. Nonspecific punctate focus of susceptibility artifact RIGHT frontal lobe. Ex vacuo dilatation of LEFT occipital horn, ventricles and sulci are otherwise normal for patient's age. Patchy to confluent supratentorial white matter T2 hyperintensities stable from prior MRI. No midline shift, mass effect or mass lesions. Prominent perivascular spaces are unchanged. No abnormal extra-axial fluid collections. Ocular globes and orbital contents are normal. Trace paranasal sinus mucosal thickening without air-fluid levels. The mastoid air cells are well aerated. No abnormal sellar expansion. No cerebellar tonsillar ectopia. No suspicious calvarial bone marrow signal. Patient appears edentulous. MRA HEAD FINDINGS Anterior circulation: Normal flow related enhancement of the included cervical, petrous, cavernous and supraclinoid internal carotid arteries. 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 Stable appearance of 5 mm superiorly directed LEFT para ophthalmic aneurysm. Patent anterior communicating artery. Normal flow related enhancement of the anterior cerebral arteries, including distal  segments. Paucity of mid to distal LEFT MCA branches compatible with old infarct. No large vessel occlusion, high-grade stenosis, abnormal luminal irregularity. Posterior circulation: RIGHT vertebral artery is dominant. Basilar artery is patent, with normal flow related enhancement of the main branch vessels. Normal flow related enhancement of the posterior cerebral arteries. Fetal origin RIGHT posterior cerebral artery. No large vessel occlusion, high-grade stenosis, abnormal luminal irregularity, aneurysm. IMPRESSION:  MRI HEAD: No acute intracranial process, specifically no acute ischemia. Old large LEFT MCA territory infarct. Moderate to severe chronic small vessel ischemic disease. MRA HEAD: No acute large vessel occlusion or high-grade stenosis. Paucity of mid to distal LEFT MCA branches compatible with old infarct. 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 Electronically Signed   By: Elon Alas M.D.   On: 05/09/2015 01:02   Dg Knee Complete 4 Views Right  05/22/2015  CLINICAL DATA:  Acute onset of left knee pain and bruising. Initial encounter. EXAM: RIGHT KNEE - COMPLETE 4+ VIEW COMPARISON:  None. FINDINGS: There is no evidence of fracture or dislocation. The joint spaces are preserved. Marginal osteophytes are seen arising at the medial compartment, and mild osteophyte formation is noted at the tibial spine. A small knee joint effusion is noted. The visualized soft tissues are otherwise unremarkable in appearance. IMPRESSION: 1. No evidence of fracture or dislocation. 2. 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 3. Small knee joint effusion noted. Electronically Signed   By: Garald Balding M.D.   On: 05/22/2015 01:01   Dg Foot Complete Right  05/10/2015  CLINICAL DATA:  Right foot pain. Possible fall 3-4 days ago. Midfoot bruising changes. EXAM: RIGHT FOOT COMPLETE - 3+ VIEW COMPARISON:  None. FINDINGS: Moderate midfoot degenerative  changes with possible erosive findings. No acute fracture is identified. Hallux valgus deformity and degenerative change at the first metatarsal phalangeal joint. IMPRESSION: Midfoot degenerative changes but no acute fracture. Electronically Signed   By: Marijo Sanes M.D.   On: 05/10/2015 09:44     Assessment/Plan   ICD-9-CM ICD-10-CM   1. Acute deep vein thrombosis (DVT) of femoral vein of left lower extremity (HCC) 453.41 I82.412   2. Dementia with behavioral disturbance 294.21 F03.91   3. Essential hypertension 401.9 I10   4. 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 Coronary artery disease involving native coronary artery of native heart with other form of angina pectoris ([place])  I25.118   6. Hyperlipidemia 272.4 E78.5   7. History of stroke V12.54 Z86.73   8. Primary osteoarthritis of right knee 715.16 M17.11     Cont current meds as ordered  repet BMP and CBC w diff  PT/OT as ordered. ST as indicated  GOAL: short term rehab with potential for long term care. Communicated with pt and nursing.  Will follow  Dreshawn Hendershott S. Perlie Gold  [facility] [address] [place], [place] [address] [phone] Cell (Monday-Friday 8 AM - 5 PM) [phone] After 5 PM and follow prompts

## 2015-05-26 NOTE — Telephone Encounter (Signed)
Left message for family to call the office to reschedule appointment

## 2015-05-27 ENCOUNTER — Non-Acute Institutional Stay (SKILLED_NURSING_FACILITY): Payer: Medicare Other | Admitting: Adult Health

## 2015-05-27 ENCOUNTER — Encounter: Payer: Self-pay | Admitting: Adult Health

## 2015-05-27 DIAGNOSIS — R4701 Aphasia: Secondary | ICD-10-CM | POA: Diagnosis not present

## 2015-05-27 DIAGNOSIS — I63312 Cerebral infarction due to thrombosis of left middle cerebral artery: Secondary | ICD-10-CM

## 2015-05-27 DIAGNOSIS — I82402 Acute embolism and thrombosis of unspecified deep veins of left lower extremity: Secondary | ICD-10-CM

## 2015-05-27 DIAGNOSIS — F0391 Unspecified dementia with behavioral disturbance: Secondary | ICD-10-CM

## 2015-05-27 DIAGNOSIS — F03918 Unspecified dementia, unspecified severity, with other behavioral disturbance: Secondary | ICD-10-CM

## 2015-05-27 NOTE — Progress Notes (Signed)
Patient ID: [name], female   DOB: 1940-12-08, 75 y.o.   MRN: [id number]    [facility]    PCP Leanor Rubenstein, MD    Allergies  Allergen Reactions  . Seroquel [Quetiapine Fumarate] Other (See Comments)    Next day after taking medication, patient was hard to wake up from her sleep    Chief Complaint  Patient presents with  . Discharge Note    HPI:  She is being discharged to assisted living for long term placement. She is a poor historian but tell me that she is feeling good. She will not need any dme or home health. The receiving facility will arrange for her therapy need and her medical needs. She will not need prescriptions to be written.  She does suffer from expressive aphasia   Past Medical History  Diagnosis Date  . Hypertension   . Hyperlipidemia   . Coronary artery disease 2000    STATUS POT PTCA ND STENTING OF THE LAD   . CVA (cerebrovascular accident) (HCC) 2010    Past Surgical History  Procedure Laterality Date  . None      VITAL SIGNS BP 103/50 mmHg  Pulse 77  Ht  (1.575 m)  Wt 136 lb 14.4 oz (62.097 kg)  BMI 25.03 kg/m2  Patient's Medications  New Prescriptions   No medications on file  Previous Medications   APIXABAN (ELIQUIS) 5 MG TABS TABLET    Take 2 tablets (10 mg total) by mouth 2 (two) times daily.   APIXABAN (ELIQUIS) 5 MG TABS TABLET    Take 1 tablet (5 mg total) by mouth 2 (two) times daily.   ARICEPT 10 MG TABLET    TAKE 1 TABLET BY MOUTH EVERY DAY AT BEDTIME   BISACODYL (DULCOLAX) 10 MG SUPPOSITORY    Place 10 mg rectally daily as needed for moderate constipation.   CLOPIDOGREL (PLAVIX) 75 MG TABLET    TAKE 1 TABLET BY MOUTH EVERY DAY   LIPITOR 40 MG TABLET    TAKE 1 TABLET BY MOUTH EVERY DAY   LISINOPRIL (PRINIVIL,ZESTRIL) 10 MG TABLET    TAKE 1 TABLET EVERY DAY   MAGNESIUM HYDROXIDE (MILK OF MAGNESIA) 400 MG/5ML SUSPENSION    Take 30 mLs by mouth daily as needed for mild constipation.   METOPROLOL TARTRATE  (LOPRESSOR) 25 MG TABLET    Take 25 mg by mouth 2 (two) times daily.   NITROGLYCERIN (NITROSTAT) 0.4 MG SL TABLET    Place 1 tablet (0.4 mg total) under the tongue every 5 (five) minutes as needed for chest pain.   SODIUM PHOSPHATES (RA SALINE ENEMA RE)    Place 1 application rectally daily as needed (constipation).  Modified Medications   No medications on file  Discontinued Medications   No medications on file     SIGNIFICANT DIAGNOSTIC EXAMS   LABS REVIEWED:   05-23-15: wbc 9.1; hgb 13.0; hct 38.2; plt 208; glucose 103; bun 20; creat 0.64; k+ 4.0; na++141   Review of Systems  Constitutional: Negative for malaise/fatigue.  Respiratory: Negative for cough.   Cardiovascular: Negative for chest pain.  Gastrointestinal: Negative for abdominal pain.  Musculoskeletal: Negative for myalgias.  Skin: Negative.   Psychiatric/Behavioral: The patient is not nervous/anxious.     Physical Exam  Constitutional: No distress.  Eyes: Conjunctivae are normal.  Neck: Neck supple. No JVD present. No thyromegaly present.  Cardiovascular: Normal rate, regular rhythm and intact distal pulses.   Respiratory: Effort normal and breath sounds  normal. No respiratory distress. She has no wheezes.  GI: Soft. Bowel sounds are normal. She exhibits no distension. There is no tenderness.  Musculoskeletal: She exhibits no edema.  Able to move all extremities   Lymphadenopathy:    She has no cervical adenopathy.  Neurological: She is alert.  Skin: Skin is warm and dry. She is not diaphoretic.  Psychiatric: She has a normal mood and affect.      ASSESSMENT/ PLAN:  Will discharge to assisted living for long term placement. She will not need home health or dme. She will not need prescriptions to be written. Her medical care will be provided at the receiving facility.    Time spent with patient 20  minutes >50% time spent counseling; reviewing medical record; tests; labs; and developing future plan of  care   Synthia Innocent NP [facility]  Contact [phone] Monday through Friday 8am- 5pm  After hours call [phone]

## 2015-05-28 ENCOUNTER — Ambulatory Visit: Payer: Medicare Other | Admitting: Cardiovascular Disease

## 2015-05-31 NOTE — Telephone Encounter (Signed)
Patient's daughter called to discuss follow-up appointment.  She states she will call back to schedule when patient is d/c'ed from short term rehab center.  She also wanted to make certain Dr. Elease Hashimoto was aware of her mother's current situation and I assured her that he is aware.  I advised her to call back with questions or concerns prior to next visit.  She verbalized understanding and agreement.

## 2015-06-02 DIAGNOSIS — R55 Syncope and collapse: Secondary | ICD-10-CM

## 2015-06-02 HISTORY — DX: Syncope and collapse: R55

## 2015-06-05 ENCOUNTER — Encounter (HOSPITAL_COMMUNITY): Payer: Self-pay | Admitting: Emergency Medicine

## 2015-06-05 ENCOUNTER — Observation Stay (HOSPITAL_COMMUNITY)
Admission: EM | Admit: 2015-06-05 | Discharge: 2015-06-08 | Disposition: A | Discharge status: Skilled Nursing Facility | Payer: Medicare Other | Attending: Internal Medicine | Admitting: Internal Medicine

## 2015-06-05 ENCOUNTER — Emergency Department (HOSPITAL_COMMUNITY): Payer: Medicare Other

## 2015-06-05 DIAGNOSIS — R627 Adult failure to thrive: Secondary | ICD-10-CM | POA: Insufficient documentation

## 2015-06-05 DIAGNOSIS — Z888 Allergy status to other drugs, medicaments and biological substances status: Secondary | ICD-10-CM | POA: Insufficient documentation

## 2015-06-05 DIAGNOSIS — I10 Essential (primary) hypertension: Secondary | ICD-10-CM | POA: Diagnosis not present

## 2015-06-05 DIAGNOSIS — Z86718 Personal history of other venous thrombosis and embolism: Secondary | ICD-10-CM | POA: Diagnosis not present

## 2015-06-05 DIAGNOSIS — R001 Bradycardia, unspecified: Secondary | ICD-10-CM | POA: Diagnosis not present

## 2015-06-05 DIAGNOSIS — R9401 Abnormal electroencephalogram [EEG]: Secondary | ICD-10-CM | POA: Insufficient documentation

## 2015-06-05 DIAGNOSIS — Z8673 Personal history of transient ischemic attack (TIA), and cerebral infarction without residual deficits: Secondary | ICD-10-CM | POA: Diagnosis not present

## 2015-06-05 DIAGNOSIS — R0789 Other chest pain: Secondary | ICD-10-CM | POA: Diagnosis not present

## 2015-06-05 DIAGNOSIS — F03918 Unspecified dementia, unspecified severity, with other behavioral disturbance: Secondary | ICD-10-CM | POA: Diagnosis present

## 2015-06-05 DIAGNOSIS — M7989 Other specified soft tissue disorders: Secondary | ICD-10-CM

## 2015-06-05 DIAGNOSIS — I671 Cerebral aneurysm, nonruptured: Secondary | ICD-10-CM | POA: Insufficient documentation

## 2015-06-05 DIAGNOSIS — R55 Syncope and collapse: Principal | ICD-10-CM | POA: Insufficient documentation

## 2015-06-05 DIAGNOSIS — Z7901 Long term (current) use of anticoagulants: Secondary | ICD-10-CM | POA: Insufficient documentation

## 2015-06-05 DIAGNOSIS — I2510 Atherosclerotic heart disease of native coronary artery without angina pectoris: Secondary | ICD-10-CM | POA: Diagnosis not present

## 2015-06-05 DIAGNOSIS — I82402 Acute embolism and thrombosis of unspecified deep veins of left lower extremity: Secondary | ICD-10-CM | POA: Diagnosis present

## 2015-06-05 DIAGNOSIS — F0391 Unspecified dementia with behavioral disturbance: Secondary | ICD-10-CM | POA: Insufficient documentation

## 2015-06-05 DIAGNOSIS — E785 Hyperlipidemia, unspecified: Secondary | ICD-10-CM | POA: Diagnosis not present

## 2015-06-05 HISTORY — DX: Syncope and collapse: R55

## 2015-06-05 HISTORY — DX: Constipation, unspecified: K59.00

## 2015-06-05 HISTORY — DX: Acute embolism and thrombosis of unspecified deep veins of unspecified lower extremity: I82.409

## 2015-06-05 HISTORY — DX: Unspecified urinary incontinence: R32

## 2015-06-05 HISTORY — DX: Unspecified dementia, unspecified severity, without behavioral disturbance, psychotic disturbance, mood disturbance, and anxiety: F03.90

## 2015-06-05 HISTORY — DX: Frequency of micturition: R35.0

## 2015-06-05 LAB — I-STAT TROPONIN, ED: Troponin i, poc: 0 ng/mL (ref 0.00–0.08)

## 2015-06-05 LAB — URINE MICROSCOPIC-ADD ON: WBC UA: NONE SEEN WBC/hpf (ref 0–5)

## 2015-06-05 LAB — BASIC METABOLIC PANEL
Anion gap: 12 (ref 5–15)
BUN: 17 mg/dL (ref 6–20)
CALCIUM: 9.5 mg/dL (ref 8.9–10.3)
CO2: 25 mmol/L (ref 22–32)
CREATININE: 0.62 mg/dL (ref 0.44–1.00)
Chloride: 104 mmol/L (ref 101–111)
GFR calc Af Amer: >60 mL/min (ref >60)
GLUCOSE: 134 mg/dL — AB (ref 65–99)
Potassium: 4.3 mmol/L (ref 3.5–5.1)
Sodium: 141 mmol/L (ref 135–145)

## 2015-06-05 LAB — CBC WITH DIFFERENTIAL/PLATELET
BASOS ABS: 0 10*3/uL (ref 0.0–0.1)
BASOS PCT: 0 %
EOS PCT: 0 %
Eosinophils Absolute: 0.1 10*3/uL (ref 0.0–0.7)
HCT: 38.8 % (ref 36.0–46.0)
Hemoglobin: 13.2 g/dL (ref 12.0–15.0)
Lymphocytes Relative: 11 %
Lymphs Abs: 1.2 10*3/uL (ref 0.7–4.0)
MCH: 32.7 pg (ref 26.0–34.0)
MCHC: 34 g/dL (ref 30.0–36.0)
MCV: 96 fL (ref 78.0–100.0)
MONO ABS: 0.3 10*3/uL (ref 0.1–1.0)
MONOS PCT: 3 %
Neutro Abs: 9.8 10*3/uL — ABNORMAL HIGH (ref 1.7–7.7)
Neutrophils Relative %: 86 %
PLATELETS: 240 10*3/uL (ref 150–400)
RBC: 4.04 MIL/uL (ref 3.87–5.11)
RDW: 12.7 % (ref 11.5–15.5)
WBC: 11.4 10*3/uL — ABNORMAL HIGH (ref 4.0–10.5)

## 2015-06-05 LAB — URINALYSIS, ROUTINE W REFLEX MICROSCOPIC
BILIRUBIN URINE: NEGATIVE
Glucose, UA: NEGATIVE mg/dL
Ketones, ur: NEGATIVE mg/dL
Leukocytes, UA: NEGATIVE
Nitrite: NEGATIVE
PH: 5 (ref 5.0–8.0)
Protein, ur: NEGATIVE mg/dL
SPECIFIC GRAVITY, URINE: 1.024 (ref 1.005–1.030)

## 2015-06-05 MED ORDER — SODIUM CHLORIDE 0.9 % IV BOLUS (SEPSIS)
500.0000 mL | Freq: Once | INTRAVENOUS | Status: AC
  Administered 2015-06-05: 500 mL via INTRAVENOUS

## 2015-06-05 NOTE — ED Notes (Signed)
Family at bedside, has been updated

## 2015-06-05 NOTE — ED Notes (Signed)
Pt arrives via EMS from [facility] with multiple complaints including nausea/diarrhea/malaise, syncopal episode and possible DVT in LLE. Pt was in hospital earlier this year with DVT and was on eliquis. LLE pulseless, cool to the touch and swollen. Pt experienced 20 second long syncopal episode with fire, and begin to experience tremors afterward. Pt has a hx of brain aneurysm, CAD, HTN, dementia.

## 2015-06-06 ENCOUNTER — Observation Stay (HOSPITAL_BASED_OUTPATIENT_CLINIC_OR_DEPARTMENT_OTHER): Payer: Medicare Other

## 2015-06-06 ENCOUNTER — Observation Stay (HOSPITAL_COMMUNITY): Payer: Medicare Other

## 2015-06-06 DIAGNOSIS — R55 Syncope and collapse: Secondary | ICD-10-CM | POA: Diagnosis not present

## 2015-06-06 DIAGNOSIS — M7989 Other specified soft tissue disorders: Secondary | ICD-10-CM | POA: Diagnosis not present

## 2015-06-06 DIAGNOSIS — R0789 Other chest pain: Secondary | ICD-10-CM | POA: Diagnosis present

## 2015-06-06 LAB — CBC WITH DIFFERENTIAL/PLATELET
Basophils Absolute: 0 10*3/uL (ref 0.0–0.1)
Basophils Relative: 1 %
Eosinophils Absolute: 0.1 10*3/uL (ref 0.0–0.7)
Eosinophils Relative: 2 %
HEMATOCRIT: 38.5 % (ref 36.0–46.0)
HEMOGLOBIN: 13.2 g/dL (ref 12.0–15.0)
LYMPHS ABS: 1.8 10*3/uL (ref 0.7–4.0)
Lymphocytes Relative: 28 %
MCH: 33 pg (ref 26.0–34.0)
MCHC: 34.3 g/dL (ref 30.0–36.0)
MCV: 96.3 fL (ref 78.0–100.0)
MONOS PCT: 4 %
Monocytes Absolute: 0.3 10*3/uL (ref 0.1–1.0)
NEUTROS ABS: 4.2 10*3/uL (ref 1.7–7.7)
NEUTROS PCT: 65 %
Platelets: 226 10*3/uL (ref 150–400)
RBC: 4 MIL/uL (ref 3.87–5.11)
RDW: 12.8 % (ref 11.5–15.5)
WBC: 6.4 10*3/uL (ref 4.0–10.5)

## 2015-06-06 LAB — TROPONIN I

## 2015-06-06 LAB — COMPREHENSIVE METABOLIC PANEL
ALT: 19 U/L (ref 14–54)
ANION GAP: 8 (ref 5–15)
AST: 25 U/L (ref 15–41)
Albumin: 3.1 g/dL — ABNORMAL LOW (ref 3.5–5.0)
Alkaline Phosphatase: 70 U/L (ref 38–126)
BUN: 12 mg/dL (ref 6–20)
CALCIUM: 9.1 mg/dL (ref 8.9–10.3)
CHLORIDE: 110 mmol/L (ref 101–111)
CO2: 27 mmol/L (ref 22–32)
CREATININE: 0.64 mg/dL (ref 0.44–1.00)
Glucose, Bld: 97 mg/dL (ref 65–99)
Potassium: 4.1 mmol/L (ref 3.5–5.1)
SODIUM: 145 mmol/L (ref 135–145)
Total Bilirubin: 1.2 mg/dL (ref 0.3–1.2)
Total Protein: 5.6 g/dL — ABNORMAL LOW (ref 6.5–8.1)

## 2015-06-06 LAB — MRSA PCR SCREENING: MRSA by PCR: NEGATIVE

## 2015-06-06 LAB — MAGNESIUM: MAGNESIUM: 1.8 mg/dL (ref 1.7–2.4)

## 2015-06-06 LAB — I-STAT TROPONIN, ED: TROPONIN I, POC: 0 ng/mL (ref 0.00–0.08)

## 2015-06-06 MED ORDER — NITROGLYCERIN 0.4 MG SL SUBL: 0.4000 mg | SUBLINGUAL_TABLET | SUBLINGUAL | Status: DC | PRN

## 2015-06-06 MED ORDER — ATORVASTATIN CALCIUM 40 MG PO TABS
40.0000 mg | ORAL_TABLET | Freq: Every day | ORAL | Status: DC
  Administered 2015-06-06 – 2015-06-08 (×3): 40 mg via ORAL
  Filled 2015-06-06 (×3): qty 1

## 2015-06-06 MED ORDER — ONDANSETRON HCL 4 MG/2ML IJ SOLN: 4.0000 mg | Freq: Four times a day (QID) | INTRAMUSCULAR | Status: DC | PRN

## 2015-06-06 MED ORDER — LOPERAMIDE HCL 2 MG PO CAPS: 2.0000 mg | ORAL_CAPSULE | Freq: Three times a day (TID) | ORAL | Status: DC | PRN

## 2015-06-06 MED ORDER — DONEPEZIL HCL 10 MG PO TABS
10.0000 mg | ORAL_TABLET | Freq: Every day | ORAL | Status: DC
  Administered 2015-06-06: 10 mg via ORAL
  Filled 2015-06-06: qty 1

## 2015-06-06 MED ORDER — ACETAMINOPHEN 325 MG PO TABS: 650.0000 mg | ORAL_TABLET | Freq: Four times a day (QID) | ORAL | Status: DC | PRN

## 2015-06-06 MED ORDER — METOPROLOL TARTRATE 25 MG PO TABS
25.0000 mg | ORAL_TABLET | Freq: Two times a day (BID) | ORAL | Status: DC
  Administered 2015-06-06: 25 mg via ORAL
  Filled 2015-06-06: qty 1

## 2015-06-06 MED ORDER — ONDANSETRON HCL 4 MG PO TABS: 4.0000 mg | ORAL_TABLET | Freq: Four times a day (QID) | ORAL | Status: DC | PRN

## 2015-06-06 MED ORDER — CLOPIDOGREL BISULFATE 75 MG PO TABS
75.0000 mg | ORAL_TABLET | Freq: Every day | ORAL | Status: DC
  Administered 2015-06-06 – 2015-06-08 (×3): 75 mg via ORAL
  Filled 2015-06-06 (×3): qty 1

## 2015-06-06 MED ORDER — SODIUM CHLORIDE 0.9 % IV SOLN
INTRAVENOUS | Status: DC
  Administered 2015-06-06 (×2): via INTRAVENOUS

## 2015-06-06 MED ORDER — METOPROLOL TARTRATE 12.5 MG HALF TABLET
12.5000 mg | ORAL_TABLET | Freq: Two times a day (BID) | ORAL | Status: DC
  Administered 2015-06-06 – 2015-06-07 (×3): 12.5 mg via ORAL
  Filled 2015-06-06 (×4): qty 1

## 2015-06-06 MED ORDER — SODIUM CHLORIDE 0.9 % IV SOLN
INTRAVENOUS | Status: DC
  Administered 2015-06-06: 01:00:00 via INTRAVENOUS

## 2015-06-06 MED ORDER — APIXABAN 5 MG PO TABS
5.0000 mg | ORAL_TABLET | Freq: Two times a day (BID) | ORAL | Status: DC
  Administered 2015-06-06 – 2015-06-08 (×5): 5 mg via ORAL
  Filled 2015-06-06 (×5): qty 1

## 2015-06-06 MED ORDER — LISINOPRIL 10 MG PO TABS: 10.0000 mg | ORAL_TABLET | Freq: Every day | ORAL | Status: DC

## 2015-06-06 MED ORDER — MAGNESIUM SULFATE 2 GM/50ML IV SOLN
2.0000 g | Freq: Once | INTRAVENOUS | Status: AC
  Administered 2015-06-06: 2 g via INTRAVENOUS
  Filled 2015-06-06: qty 50

## 2015-06-06 MED ORDER — SODIUM CHLORIDE 0.9% FLUSH
3.0000 mL | Freq: Two times a day (BID) | INTRAVENOUS | Status: DC
  Administered 2015-06-07 – 2015-06-08 (×2): 3 mL via INTRAVENOUS

## 2015-06-06 NOTE — H&P (Signed)
[facility] History and Physical  [name] ZOX:[id number] DOB: [birth year] DOA: 06/05/2015  Referring physician: Erroll Luna, MD PCP: Leanor Rubenstein, MD   History is provided by her daughter [name].  Chief Complaint: Syncope.  HPI: [name] is a 75 y.o. female  with a past medical history of hypertension, hyperlipidemia, brain aneurysm, CAD, CVA, dementia, who was recently diagnosed and admitted to this facility with a left lower extremity DVT and is coming today from [facility] for evaluation of a syncopal episode and other complaints.  Per patient's daughter, she has been having worsening fatigue, malaise, decreased appetite since yesterday. She has been complaining of nausea with intense dry heaves  and has had at least 2 episodes of loose stool since yesterday. Her appetite has been decreased, the patient denies having abdominal pain or vomiting. There have not been melena or hematochezia.  Her daughter states that today, while she was in the nursing home and on route to the hospital in the ambulance,the patient experienced several syncopal episodes lasting between 20 seconds to a minute or 2, followed by a few minutes of disorientation. After the last episode in the ambulance, she was having tremors, which have subsequently resolved.   Her daughter also states, that the patient felt short of breath and put her hands in her chest for a while in the nursing home, but the details of these symptoms are not totally clear. When asked, in the ED room, the patient denied having any chest pain or chest pressure at that time. She was in no acute distress and seemed to be doing better after she was given antiemetics and IV hydration. Workup in the ER so far has only show mild hyperglycemia and mild left shifted leukocytosis.  Review of Systems:  As above mentioned. History was provided by her daughter.  Past Medical History  Diagnosis Date  . Hypertension   .  Hyperlipidemia   . Coronary artery disease 2000    STATUS POT PTCA ND STENTING OF THE LAD   . CVA (cerebrovascular accident) (HCC) 2010  . Constipation   . Dementia   . DVT (deep venous thrombosis) (HCC)     LLE   Past Surgical History  Procedure Laterality Date  . None    . Coronary angioplasty with stent placement     Social History:  reports that she quit smoking about 7 years ago. She has never used smokeless tobacco. She reports that she does not drink alcohol or use illicit drugs.  Allergies  Allergen Reactions  . Seroquel [Quetiapine Fumarate] Other (See Comments)    Next day after taking medication, patient was hard to wake up from her sleep    Family History  Problem Relation Age of Onset  . Alzheimer's disease Mother      Prior to Admission medications   Medication Sig Start Date End Date Taking? Authorizing Provider  apixaban (ELIQUIS) 5 MG TABS tablet Take 1 tablet (5 mg total) by mouth 2 (two) times daily. 05/29/15  Yes Catarina Hartshorn, MD  ARICEPT 10 MG tablet TAKE 1 TABLET BY MOUTH EVERY DAY AT BEDTIME 04/29/15  Yes Micki Riley, MD  bisacodyl (DULCOLAX) 10 MG suppository Place 10 mg rectally daily as needed for moderate constipation.   Yes Historical Provider, MD  clopidogrel (PLAVIX) 75 MG tablet TAKE 1 TABLET BY MOUTH EVERY DAY 03/10/15  Yes Vesta Mixer, MD  LIPITOR 40 MG tablet TAKE 1 TABLET BY MOUTH EVERY DAY 05/10/15  Yes  Vesta Mixer, MD  lisinopril (PRINIVIL,ZESTRIL) 10 MG tablet TAKE 1 TABLET EVERY DAY 03/10/15  Yes Vesta Mixer, MD  loperamide (IMODIUM A-D) 2 MG tablet Take 2 mg by mouth 3 (three) times daily as needed for diarrhea or loose stools.   Yes Historical Provider, MD  magnesium hydroxide (MILK OF MAGNESIA) 400 MG/5ML suspension Take 30 mLs by mouth daily as needed for mild constipation.   Yes Historical Provider, MD  metoprolol tartrate (LOPRESSOR) 25 MG tablet Take 25 mg by mouth 2 (two) times daily.   Yes Historical Provider, MD    nitroGLYCERIN (NITROSTAT) 0.4 MG SL tablet Place 1 tablet (0.4 mg total) under the tongue every 5 (five) minutes as needed for chest pain. 03/12/14  Yes Vesta Mixer, MD  Sodium Phosphates (RA SALINE ENEMA RE) Place 1 application rectally daily as needed (constipation).   Yes Historical Provider, MD  apixaban (ELIQUIS) 5 MG TABS tablet Take 2 tablets (10 mg total) by mouth 2 (two) times daily. 05/23/15   Catarina Hartshorn, MD   Physical Exam: Daiva Eves:   06/05/15 2108 06/05/15 2115 06/05/15 2230 06/06/15 0104  BP: 117/51 106/52 115/41 103/47  Pulse: 62 60 55 55  Temp: 97.6 F (36.4 C)   98 F (36.7 C)  TempSrc: Oral   Oral  Resp: Height:     (1.575 m)  Weight:    64.229 kg (141 lb 9.6 oz)  SpO2: 96% 96% 93% 97%    Wt Readings from Last 3 Encounters:  06/06/15 64.229 kg (141 lb 9.6 oz)  05/27/15 62.097 kg (136 lb 14.4 oz)  05/25/15 62.188 kg (137 lb 1.6 oz)    General:  Appears calm and comfortable Eyes: PERRL, normal lids, irises & conjunctiva ENT: grossly normal hearing, lips and oral mucosa are moist. Neck: no LAD, masses or thyromegaly Cardiovascular: RRR, no m/r/g. + LLE edema. Telemetry: SR, no arrhythmias  Respiratory: CTA bilaterally, no w/r/r. Normal respiratory effort. Abdomen: soft, ntnd Skin: no rash or induration seen on limited exam Musculoskeletal: grossly normal tone BUE/BLE Psychiatric: grossly normal mood and affect, speech is fluent, occasionally mildly confused. Neurologic: Awake, alert, oriented x2, grossly non-focal.          Labs on Admission:  Basic Metabolic Panel:  Recent Labs Lab 06/05/15 2139  NA 141  K 4.3  CL 104  CO2 25  GLUCOSE 134*  BUN 17  CREATININE 0.62  CALCIUM 9.5   CBC:  Recent Labs Lab 06/05/15 2139  WBC 11.4*  NEUTROABS 9.8*  HGB 13.2  HCT 38.8  MCV 96.0  PLT 240    Radiological Exams on Admission: Dg Chest 2 View  06/05/2015  CLINICAL DATA:  Syncope EXAM: CHEST  2 VIEW COMPARISON:   06/21/2011 FINDINGS: The heart size and mediastinal contours are within normal limits. Both lungs are clear. The visualized skeletal structures are unremarkable. IMPRESSION: No active cardiopulmonary disease. Electronically Signed   By: Marlan Palau M.D.   On: 06/05/2015 22:38   Echocardiogram 05/09/2015  ------------------------------------------------------------------- LV EF: 60% -  65%  ------------------------------------------------------------------- Indications:   CVA 436.  ------------------------------------------------------------------- History:  PMH: Former Smoker. Coronary artery disease. Stroke. Transient ischemic attack. Risk factors: Hypertension. Dyslipidemia.  ------------------------------------------------------------------- Study Conclusions  - Left ventricle: The cavity size was normal. There was moderate concentric hypertrophy. Systolic function was normal. The estimated ejection fraction was in the range of 60% to 65%. Wall motion was normal; there were no regional wall motion abnormalities.  There was an increased relative contribution of atrial contraction to ventricular filling. Doppler parameters are consistent with abnormal left ventricular relaxation (grade 1 diastolic dysfunction). - Mitral valve: Calcified annulus.   EKG: Independently reviewed. Vent. rate 61 BPM PR interval 181 ms QRS duration 99 ms QT/QTc 509/513 ms P-R-T axes 46 -21 155 Sinus rhythm Borderline left axis deviation Prolonged QT interval No significant changes when compared to previous EKG.  Assessment/Plan Principal Problem:   Syncope Admit to telemetry for observation. Trend troponin levels. The patient has had a recent echocardiogram and carotid Doppler.  Active Problems:   Chest pressure   History of CAD Continue cardiac telemetry. Follow cardiac biomarkers trend. Continue Plavix, Eliquis and metoprolol.    Hypertension Continue  lisinopril 10 mg by mouth daily Continue metoprolol 25 mg by mouth twice a day. Monitor blood pressure periodically.    Hyperlipidemia Continue atorvastatin 40 mg by mouth daily. Monitor LFTs and lipid panel periodically.    Dementia with behavioral disturbance Continue Iressa 10 mg by mouth at bedtime.    Left leg DVT (HCC) Worsening edema is reported by the patient's daughter. Continue Apixaban 5 mg by mouth twice a day. Recheck venous duplex of LLE ordered earlier by the ED, will follow results.     Code Status: Full code. DVT Prophylaxis: SCDs. Family Communication: Her daughter [name] was by bedside. Disposition Plan: Admit to telemetry, trend cardiac biomarkers hydrate overnight with 1 L of normal saline.  Time spent: Over 70 minutes were spent in the process of this admission.  Bobette Mo, M.D. [facility] Pager [phone].

## 2015-06-06 NOTE — Progress Notes (Signed)
Found pt with her IV out, holding IV tubing and bloody gauze in her hand.  Dressing applied to site [facility]), placed IV team consult to placed another IV. Pt needs IVF and IV mag sulfate.

## 2015-06-06 NOTE — ED Notes (Signed)
Hospitalist bedside 

## 2015-06-06 NOTE — Progress Notes (Signed)
VASCULAR LAB PRELIMINARY  PRELIMINARY  PRELIMINARY  PRELIMINARY  Left lower extremity venous duplex completed.    Preliminary report:  Left:  Previous DVT has resolved except for some non occlusive residual thrombus in the proximal  CFV. No acute DVT or superficial thrombosis.   Right:  No DVT in the CFV.  Damarri Rampy, RVT 06/06/2015, 1:02 PM

## 2015-06-06 NOTE — Progress Notes (Addendum)
[facility] PROGRESS NOTE  [name] ZOX:[id number] DOB: 06/08/1940 DOA: 06/05/2015 PCP: Leanor Rubenstein, MD  Assessment/Plan: 1-Syncope:  Unclear etiology.  Troponin negative.  Had echo recently/ 05-09-2015 Will check Orthostatic vitals.  Hold lisinopril. Check CT head and EEG EKG with prolong QT, will check Mg level. Holder parameter for metoprolol.  Hold aricept.  IV fluids.   2-Left LE DVT; Continue with eliquis.  Repeated doppler with improved Left LE DVT/   3-FTT;  Patient with fatigue, malaise. Loose stool.  No further diarrhea. Will order ensure.   CAD, Chest pressure. ; resolved. Troponin negative.  Patient on metoprolol, statin, eliquis.   Hyperlipidemia Continue atorvastatin 40 mg by mouth daily.   Dementia with behavioral disturbance Hold Aricept due to bradyxcardia   Left leg DVT (HCC) Worsening edema is reported by the patient's daughter. Continue Apixaban 5 mg by mouth twice a day. Recheck venous duplex of LLE ordered earlier by the ED, will follow results.  Code Status: Full Code.  Family Communication: try to contact daughter  Disposition Plan: SNF in 24 hours.    Consultants:  none  Procedures:  Doppler;   Antibiotics:  none  HPI/Subjective: Patient is alert, pleasantly confused.    Objective: Filed Vitals:   06/06/15 0104 06/06/15 0508  BP: 103/47 133/52  Pulse: 55 53  Temp: 98 F (36.7 C) 97.8 F (36.6 C)  Resp: 16 16   No intake or output data in the 24 hours ending 06/06/15 0727 Filed Weights   06/06/15 0104  Weight: 64.229 kg (141 lb 9.6 oz)    Exam:   General:  NAD  Cardiovascular: S 1, S 2 RRR  Respiratory: CTA  Abdomen: BS present, soft, nt  Musculoskeletal: trace edema   Data Reviewed: Basic Metabolic Panel:  Recent Labs Lab 06/05/15 2139  NA 141  K 4.3  CL 104  CO2 25  GLUCOSE 134*  BUN 17  CREATININE 0.62  CALCIUM 9.5   Liver Function Tests: No results for input(s): AST, ALT,  ALKPHOS, BILITOT, PROT, ALBUMIN in the last 168 hours. No results for input(s): LIPASE, AMYLASE in the last 168 hours. No results for input(s): AMMONIA in the last 168 hours. CBC:  Recent Labs Lab 06/05/15 2139  WBC 11.4*  NEUTROABS 9.8*  HGB 13.2  HCT 38.8  MCV 96.0  PLT 240   Cardiac Enzymes:  Recent Labs Lab 06/06/15 0333  TROPONINI <0.03   BNP (last 3 results) No results for input(s): BNP in the last 8760 hours.  ProBNP (last 3 results) No results for input(s): PROBNP in the last 8760 hours.  CBG: No results for input(s): GLUCAP in the last 168 hours.  Recent Results (from the past 240 hour(s))  MRSA PCR Screening     Status: None   Collection Time: 06/06/15  1:45 AM  Result Value Ref Range Status   MRSA by PCR NEGATIVE NEGATIVE Final    Comment:        The GeneXpert MRSA Assay (FDA approved for NASAL specimens only), is one component of a comprehensive MRSA colonization surveillance program. It is not intended to diagnose MRSA infection nor to guide or monitor treatment for MRSA infections.      Studies: Dg Chest 2 View  06/05/2015  CLINICAL DATA:  Syncope EXAM: CHEST  2 VIEW COMPARISON:  06/21/2011 FINDINGS: The heart size and mediastinal contours are within normal limits. Both lungs are clear. The visualized skeletal structures are unremarkable. IMPRESSION: No active cardiopulmonary disease. Electronically Signed  By: Marlan Palau M.D.   On: 06/05/2015 22:38    Scheduled Meds: . apixaban  5 mg Oral BID  . atorvastatin  40 mg Oral Daily  . clopidogrel  75 mg Oral Daily  . donepezil  10 mg Oral QHS  . metoprolol tartrate  25 mg Oral BID  . sodium chloride flush  3 mL Intravenous Q12H   Continuous Infusions: . sodium chloride 100 mL/hr at 06/06/15 0117    Principal Problem:   Syncope Active Problems:   Hypertension   Hyperlipidemia   Dementia with behavioral disturbance   Left leg DVT (HCC)   Chest pressure    Time spent: 25 minutes.      Hartley Barefoot A  [facility] Pager [phone]. If 7PM-7AM, please contact night-coverage at [email or web address], password [facility] 06/06/2015, 7:27 AM

## 2015-06-06 NOTE — Discharge Instructions (Signed)
°  Information on my medicine - ELIQUIS (apixaban)  Why was Eliquis prescribed for you? Eliquis was prescribed to treat blood clots previously found in the veins of your legs (deep vein thrombosis) and to reduce the risk of them occurring again.  What do You need to know about Eliquis ? Take ONE 5 mg tablet taken TWICE daily.  Eliquis may be taken with or without food.   Try to take the dose about the same time in the morning and in the evening. If you have difficulty swallowing the tablet whole please discuss with your pharmacist how to take the medication safely.  Take Eliquis exactly as prescribed and DO NOT stop taking Eliquis without talking to the doctor who prescribed the medication.  Stopping may increase your risk of developing a new blood clot.  Refill your prescription before you run out.  After discharge, you should have regular check-up appointments with your healthcare provider that is prescribing your Eliquis.    What do you do if you miss a dose? If a dose of ELIQUIS is not taken at the scheduled time, take it as soon as possible on the same day and twice-daily administration should be resumed. The dose should not be doubled to make up for a missed dose.  Important Safety Information A possible side effect of Eliquis is bleeding. You should call your healthcare provider right away if you experience any of the following: ? Bleeding from an injury or your nose that does not stop. ? Unusual colored urine (red or dark brown) or unusual colored stools (red or black). ? Unusual bruising for unknown reasons. ? A serious fall or if you hit your head (even if there is no bleeding).  Some medicines may interact with Eliquis and might increase your risk of bleeding or clotting while on Eliquis. To help avoid this, consult your healthcare provider or pharmacist prior to using any new prescription or non-prescription medications, including herbals, vitamins, non-steroidal  anti-inflammatory drugs (NSAIDs) and supplements.  This website has more information on Eliquis (apixaban): http://www.eliquis.com/eliquis/home

## 2015-06-06 NOTE — Care Management Obs Status (Signed)
MEDICARE OBSERVATION STATUS NOTIFICATION   Patient Details  Name: [name] MRN: [id number] Date of Birth: November 05, 1940   Medicare Observation Status Notification Given:  Yes    Antony Haste, RN 06/06/2015, 10:14 AM

## 2015-06-07 ENCOUNTER — Observation Stay (HOSPITAL_COMMUNITY): Payer: Medicare Other

## 2015-06-07 DIAGNOSIS — R55 Syncope and collapse: Secondary | ICD-10-CM

## 2015-06-07 DIAGNOSIS — I10 Essential (primary) hypertension: Secondary | ICD-10-CM | POA: Diagnosis not present

## 2015-06-07 DIAGNOSIS — R4182 Altered mental status, unspecified: Secondary | ICD-10-CM | POA: Diagnosis not present

## 2015-06-07 DIAGNOSIS — F0391 Unspecified dementia with behavioral disturbance: Secondary | ICD-10-CM | POA: Diagnosis not present

## 2015-06-07 DIAGNOSIS — E785 Hyperlipidemia, unspecified: Secondary | ICD-10-CM | POA: Diagnosis not present

## 2015-06-07 DIAGNOSIS — R0789 Other chest pain: Secondary | ICD-10-CM | POA: Diagnosis not present

## 2015-06-07 DIAGNOSIS — F488 Other specified nonpsychotic mental disorders: Secondary | ICD-10-CM | POA: Diagnosis not present

## 2015-06-07 MED ORDER — LISINOPRIL 10 MG PO TABS
10.0000 mg | ORAL_TABLET | Freq: Every day | ORAL | Status: DC
  Administered 2015-06-07: 10 mg via ORAL
  Filled 2015-06-07: qty 1

## 2015-06-07 NOTE — Procedures (Signed)
HPI:  75 y.o. female with a past medical history of hypertension, hyperlipidemia, brain aneurysm, CAD, CVA, dementia, who was recently diagnosed and admitted to this facility with a left lower extremity DVT and is coming today from [facility] for evaluation of a syncopal episode  TECHNICAL SUMMARY:  A multichannel referential and bipolar montage EEG using the standard international 10-20 system was performed on the patient described as confused and not oriented.  The dominant background activity consists of 8.5 hertz activity seen most prominantly over the posterior head region.  3-5 hertz activity can be seen overriding intermittently in the frontal regions.  The backgound activity is reactive to eye opening and closing procedures.  Low voltage fast (beta) activity is distributed symmetrically and maximally over the anterior head regions.  ACTIVATION:  Hyperventilation and photic stimulation were not performed.  EPILEPTIFORM ACTIVITY:  There were no spikes, sharp waves or paroxysmal activity.  SLEEP:  Physiologic drowsiness as well as stage II sleep were noted.  CARDIAC:  The EKG lead was not recorded  IMPRESSION:  This is an abnormal EEG demonstrating a mild diffuse slowing of electrocerebral activity.  This can be seen in a wide variety of encephalopathic state including those of a toxic, metabolic, or degenerative nature.  There were no focal, hemispheric, or lateralizing features.  No epileptiform activity was recorded.

## 2015-06-07 NOTE — Consult Note (Addendum)
CARDIOLOGY CONSULT NOTE   Patient ID: [name] MRN: [id number], DOB/AGE: [id number]   Admit date: 06/05/2015 Date of Consult: 06/07/2015   Primary Physician: Leanor Rubenstein, MD Primary Cardiologist: Dr. Elease Hashimoto  Pt. Profile  [name] is a 75 year old Caucasian female with past medical history of dementia, CAD s/p PCI to LAD 2000, hypertension, hyperlipidemia, CVA in 2010, moderate dementia, recently diagnosed brain aneurysm and DVT presented with syncope  Problem List  Past Medical History  Diagnosis Date  . Hypertension   . Hyperlipidemia   . Coronary artery disease 2000    STATUS POT [facility] ND STENTING OF THE LAD   . CVA (cerebrovascular accident) (HCC) 2010  . Constipation   . Dementia   . DVT (deep venous thrombosis) (HCC)     LLE    Past Surgical History  Procedure Laterality Date  . None    . Coronary angioplasty with stent placement       Allergies  Allergies  Allergen Reactions  . Seroquel [Quetiapine Fumarate] Other (See Comments)    Next day after taking medication, patient was hard to wake up from her sleep    HPI   [name] is a 75 year old Caucasian female with past medical history of dementia, CAD s/p PCI to LAD 2000, hypertension, hyperlipidemia, CVA in 2010, moderate dementia, recently diagnosed brain aneurysm and DVT. She has been followed by Dr. Elease Hashimoto, the last follow-up and confined in Epic system dated back to November 2015, at which time patient was doing well from a cardiovascular perspective. She was admitted from 1/7-1/9 with TIA-like symptoms. 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 Echocardiogram obtained on 1/80/2017 showed EF 60-65%, no regional wall motion abnormality, grade 1 diastolic dysfunction. She was readmitted around 1/20 with left lower extremity swelling and diagnosed with DVT. She was started on eliquis along with her plavix. She was not deemed a candidate  for thrombolytic therapy by interventional radiology given her dementia and bilateral ophthalmic artery aneurysm.   According to the patient, she did not eat any food on Saturday 06/05/2015. She had an episode of syncope on Sunday 2/5. She cannot give me any more details regarding the syncope except she had some dizziness prior to that. She could not tell me whether or not she had chest pain or shortness of breath. When asked about chest pain, she states she might have that before however could not specify the time. She has chronically abnormal EKG with T-wave inversion in anterolateral leads. This has not changed. She came to [facility] on the same day. According to record, patient had several more episode of syncope in the EMS truck. We are currently obtaining the EMS strips to see if there is any correlated arrhythmia. EEG obtained showed mild diffuse slowing of his electrocerebral activity, this is seen in wide variety of encephalopathic state including toxic, metabolic, and a degenerative nature. There is no focal feature or any sign of seizure. Given her moderate dementia, she could not give any meaningful history. It was unclear whether she received any food or glucose prior to arrival, first lab after arrival shows glucose of 134. Patient think she syncopized because of not eating the day before. Initial laboratory finding also significant for white blood cell count of 11.4. Serial troponin negative. CT of the head showed no acute intracranial process, old large left MCA territory infarct. Left lower 70 DVT has largely resolved except for nonocclusive residual thrombus  in the proximal CFV on repeat venous Doppler on file 2/5. Cardiology has been consulted for syncope.    Inpatient Medications  . apixaban  5 mg Oral BID  . atorvastatin  40 mg Oral Daily  . clopidogrel  75 mg Oral Daily  . lisinopril  10 mg Oral Daily  . metoprolol tartrate  12.5 mg Oral BID  . sodium chloride flush  3 mL  Intravenous Q12H    Family History Family History  Problem Relation Age of Onset  . Alzheimer's disease Mother      Social History Social History   Social History  . Marital Status: Widowed    Spouse Name: N/A  . Number of Children: 4  . Years of Education: 12   Occupational History  . Not on file.   Social History Main Topics  . Smoking status: Former Smoker    Quit date: 05/01/2008  . Smokeless tobacco: Never Used  . Alcohol Use: No  . Drug Use: No  . Sexual Activity: Not on file   Other Topics Concern  . Not on file   Social History Narrative   Patient is single, with 4 living children, and 2 deceased children   Patient is left handed   Patient has her GED   Patient drinks 2 cups daily     Review of Systems  General:  No chills, fever Cardiovascular:  No dyspnea on exertion, edema, orthopnea, palpitations, paroxysmal nocturnal dyspnea. Dermatological: No rash, lesions/masses Respiratory: No cough, dyspnea Urologic: No hematuria, dysuria Abdominal:   No nausea, vomiting, diarrhea, bright red blood per rectum, melena, or hematemesis Neurologic:  "i passed out because I have not been eating the day before. Food was bad" All other systems reviewed and are otherwise negative except as noted above.  Physical Exam  Blood pressure 134/55, pulse 88, temperature 98.7 F (37.1 C), temperature source Oral, resp. rate 17, height 5\' 2"  (1.575 m), weight 141 lb 9.6 oz (64.229 kg), SpO2 95 %.  General: Pleasant, NAD Psych: Normal affect. Neuro: Alert. Moderately demented. Moves all extremities spontaneously. HEENT: Normal  Neck: Supple without bruits or JVD. Lungs:  Resp regular and unlabored, CTA. Heart: RRR no s3, s4, or murmurs. Abdomen: Soft, non-tender, non-distended, BS + x 4.  Extremities: No clubbing, cyanosis or edema. DP/PT/Radials 2+ and equal bilaterally.  Labs   Recent Labs  06/06/15 0333 06/06/15 0956  TROPONINI <0.03 <0.03   Lab Results    Component Value Date   WBC 6.4 06/06/2015   HGB 13.2 06/06/2015   HCT 38.5 06/06/2015   MCV 96.3 06/06/2015   PLT 226 06/06/2015    Recent Labs Lab 06/06/15 0956  NA 145  K 4.1  CL 110  CO2 27  BUN 12  CREATININE 0.64  CALCIUM 9.1  PROT 5.6*  BILITOT 1.2  ALKPHOS 70  ALT 19  AST 25  GLUCOSE 97   Lab Results  Component Value Date   CHOL 139 05/09/2015   HDL 61 05/09/2015   LDLCALC 66 05/09/2015   TRIG 62 05/09/2015   No results found for: DDIMER  Radiology/Studies  Dg Chest 2 View  06/05/2015  CLINICAL DATA:  Syncope EXAM: CHEST  2 VIEW COMPARISON:  06/21/2011 FINDINGS: The heart size and mediastinal contours are within normal limits. Both lungs are clear. The visualized skeletal structures are unremarkable. IMPRESSION: No active cardiopulmonary disease. Electronically Signed   By: Marlan Palau M.D.   On: 06/05/2015 22:38   Ct Head Wo Contrast  06/06/2015  CLINICAL DATA:  Syncopal episode today at skilled nursing facility. Recent LEFT lower extremity deep vein thrombosis. History of hypertension, hyperlipidemia, dementia, stroke, brain aneurysm. EXAM: CT HEAD WITHOUT CONTRAST TECHNIQUE: Contiguous axial images were obtained from the base of the skull through the vertex without intravenous contrast. COMPARISON:  MRI of the brain May 08, 2015 FINDINGS: LEFT temporal parietal encephalomalacia with ex vacuo dilatation of LEFT ventricle atrium and occipital horn, unchanged. Ventricles and sulci are otherwise normal for patient's age. Patchy to confluent supratentorial white matter hypodensities without midline shift, mass effect, intraparenchymal hemorrhage or acute large vascular territory infarcts. No abnormal extra-axial fluid collections. Basal cisterns are patent. Mild calcific atherosclerosis of the carotid siphons and included vertebral arteries. Old LEFT depressed nasal bone fracture. Paranasal sinuses and mastoid air cells are well aerated. No skull fracture.  IMPRESSION: No acute intracranial process. Old large LEFT MCA territory infarct and moderate to severe chronic small vessel ischemic disease. Electronically Signed   By: Awilda Metro M.D.   On: 06/06/2015 23:40   Ct Head Wo Contrast  05/08/2015  CLINICAL DATA:  Right-sided weakness and altered mental status. History of old infarct. EXAM: CT HEAD WITHOUT CONTRAST TECHNIQUE: Contiguous axial images were obtained from the base of the skull through the vertex without intravenous contrast. COMPARISON:  Brain MRI, 12/12/2013.  Head CT, 06/20/2011. FINDINGS: Ventricles are normal configuration. There is ex vacuo dilation of the posterior left lateral ventricle due to an old left sided infarct that involves the majority of the left parietal lobe posterior left temporal lobe and portions of the left occipital lobe. This is stable from the prior exams. There is no hydrocephalus. There are no parenchymal masses or mass effect. There is no evidence of a recent cortical infarct. Other patchy areas of white matter hypoattenuation are noted consistent with moderate chronic microvascular ischemic change, also stable. There are no extra-axial masses or abnormal fluid collections. There is no intracranial hemorrhage. Visualized sinuses and mastoid air cells are clear. IMPRESSION: 1. No acute intracranial abnormalities. 2. Old left posterior MCA distribution infarct. 3. Moderate chronic microvascular ischemic change. 4. Stable appearance from the prior head CT and brain MRI. Electronically Signed   By: Amie Portland M.D.   On: 05/08/2015 20:23   Mr Maxine Glenn Head Wo Contrast  05/09/2015  CLINICAL DATA:  Larey Seat twice, worsening RIGHT-sided weakness. Unable to walk. History of hypertension, hyperlipidemia, stroke 2010 with residual mild RIGHT hemi paresis. EXAM: MRI HEAD WITHOUT CONTRAST MRA HEAD WITHOUT CONTRAST TECHNIQUE: Multiplanar, multiecho pulse sequences of the brain and surrounding structures were obtained without intravenous  contrast. Angiographic images of the head were obtained using MRA technique without contrast. COMPARISON:  CT head May 07, 2014 at 2016 hours and MRI of the brain December 12, 2013 and MRA head December 01, 2009 FINDINGS: MRI HEAD FINDINGS No reduced diffusion to suggest acute ischemia. LEFT temporal parietal occipital encephalomalacia with faint susceptibility artifact compatible with mineralization. Nonspecific punctate focus of susceptibility artifact RIGHT frontal lobe. Ex vacuo dilatation of LEFT occipital horn, ventricles and sulci are otherwise normal for patient's age. Patchy to confluent supratentorial white matter T2 hyperintensities stable from prior MRI. No midline shift, mass effect or mass lesions. Prominent perivascular spaces are unchanged. No abnormal extra-axial fluid collections. Ocular globes and orbital contents are normal. Trace paranasal sinus mucosal thickening without air-fluid levels. The mastoid air cells are well aerated. No abnormal sellar expansion. No cerebellar tonsillar ectopia. No suspicious calvarial bone marrow signal. Patient appears edentulous. MRA  HEAD FINDINGS Anterior circulation: Normal flow related enhancement of the included cervical, petrous, cavernous and supraclinoid internal carotid arteries. 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 Stable appearance of 5 mm superiorly directed LEFT para ophthalmic aneurysm. Patent anterior communicating artery. Normal flow related enhancement of the anterior cerebral arteries, including distal segments. Paucity of mid to distal LEFT MCA branches compatible with old infarct. No large vessel occlusion, high-grade stenosis, abnormal luminal irregularity. Posterior circulation: RIGHT vertebral artery is dominant. Basilar artery is patent, with normal flow related enhancement of the main branch vessels. Normal flow related enhancement of the posterior cerebral arteries. Fetal origin RIGHT  posterior cerebral artery. No large vessel occlusion, high-grade stenosis, abnormal luminal irregularity, aneurysm. IMPRESSION: MRI HEAD: No acute intracranial process, specifically no acute ischemia. Old large LEFT MCA territory infarct. Moderate to severe chronic small vessel ischemic disease. MRA HEAD: No acute large vessel occlusion or high-grade stenosis. Paucity of mid to distal LEFT MCA branches compatible with old infarct. 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 Electronically Signed   By: Awilda Metro M.D.   On: 05/09/2015 01:02   Mr Brain Wo Contrast  05/09/2015  CLINICAL DATA:  Larey Seat twice, worsening RIGHT-sided weakness. Unable to walk. History of hypertension, hyperlipidemia, stroke 2010 with residual mild RIGHT hemi paresis. EXAM: MRI HEAD WITHOUT CONTRAST MRA HEAD WITHOUT CONTRAST TECHNIQUE: Multiplanar, multiecho pulse sequences of the brain and surrounding structures were obtained without intravenous contrast. Angiographic images of the head were obtained using MRA technique without contrast. COMPARISON:  CT head May 07, 2014 at 2016 hours and MRI of the brain December 12, 2013 and MRA head December 01, 2009 FINDINGS: MRI HEAD FINDINGS No reduced diffusion to suggest acute ischemia. LEFT temporal parietal occipital encephalomalacia with faint susceptibility artifact compatible with mineralization. Nonspecific punctate focus of susceptibility artifact RIGHT frontal lobe. Ex vacuo dilatation of LEFT occipital horn, ventricles and sulci are otherwise normal for patient's age. Patchy to confluent supratentorial white matter T2 hyperintensities stable from prior MRI. No midline shift, mass effect or mass lesions. Prominent perivascular spaces are unchanged. No abnormal extra-axial fluid collections. Ocular globes and orbital contents are normal. Trace paranasal sinus mucosal thickening without air-fluid levels. The mastoid air cells are well  aerated. No abnormal sellar expansion. No cerebellar tonsillar ectopia. No suspicious calvarial bone marrow signal. Patient appears edentulous. MRA HEAD FINDINGS Anterior circulation: Normal flow related enhancement of the included cervical, petrous, cavernous and supraclinoid internal carotid arteries. 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 Stable appearance of 5 mm superiorly directed LEFT para ophthalmic aneurysm. Patent anterior communicating artery. Normal flow related enhancement of the anterior cerebral arteries, including distal segments. Paucity of mid to distal LEFT MCA branches compatible with old infarct. No large vessel occlusion, high-grade stenosis, abnormal luminal irregularity. Posterior circulation: RIGHT vertebral artery is dominant. Basilar artery is patent, with normal flow related enhancement of the main branch vessels. Normal flow related enhancement of the posterior cerebral arteries. Fetal origin RIGHT posterior cerebral artery. No large vessel occlusion, high-grade stenosis, abnormal luminal irregularity, aneurysm. IMPRESSION: MRI HEAD: No acute intracranial process, specifically no acute ischemia. Old large LEFT MCA territory infarct. Moderate to severe chronic small vessel ischemic disease. MRA HEAD: No acute large vessel occlusion or high-grade stenosis. Paucity of mid to distal LEFT MCA branches compatible with old infarct. Stable appearance of 6 mm RIGHT para ophthalmic aneurysm  and 5 mm LEFT para ophthalmic aneurysm. Electronically Signed   By: Awilda Metro M.D.   On: 05/09/2015 01:02   Dg Knee Complete 4 Views Right  05/22/2015  CLINICAL DATA:  Acute onset of left knee pain and bruising. Initial encounter. EXAM: RIGHT KNEE - COMPLETE 4+ VIEW COMPARISON:  None. FINDINGS: There is no evidence of fracture or dislocation. The joint spaces are preserved. Marginal osteophytes are seen arising at the medial compartment, and mild  osteophyte formation is noted at the tibial spine. A small knee joint effusion is noted. The visualized soft tissues are otherwise unremarkable in appearance. IMPRESSION: 1. No evidence of fracture or dislocation. 2. 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 3. Small knee joint effusion noted. Electronically Signed   By: Roanna Raider M.D.   On: 05/22/2015 01:01   Dg Foot Complete Right  05/10/2015  CLINICAL DATA:  Right foot pain. Possible fall 3-4 days ago. Midfoot bruising changes. EXAM: RIGHT FOOT COMPLETE - 3+ VIEW COMPARISON:  None. FINDINGS: Moderate midfoot degenerative changes with possible erosive findings. No acute fracture is identified. Hallux valgus deformity and degenerative change at the first metatarsal phalangeal joint. IMPRESSION: Midfoot degenerative changes but no acute fracture. Electronically Signed   By: Rudie Meyer M.D.   On: 05/10/2015 09:44   Ir Radiologist Eval & Mgmt  05/28/2015  EXAM: NEW PATIENT OFFICE VISIT CHIEF COMPLAINT: History of intracranial aneurysms. Current Pain Level: 1-10 HISTORY OF PRESENT ILLNESS: The patient is a 75 year old, right-handed lady referred for evaluation of treatment of intracranial aneurysms, which were seen on a recent MRI/MRA of the brain during her recent admission. The patient herself could not physically be present. However the patient's daughter, Carlyle Basques, was available for consultation. The patient's daughter apparently is aware of the 2 aneurysms in the brain in her mom. The presence of aneurysm apparently dates to her previous MRI/MRAs performed. Apparently there was reportedly a slight increase in the size of the aneurysms. The patient's daughter reports that her mother suffers from Alzheimer's dementia with her mother requiring constant supervision. However she reports this is only gotten recently worse. The patient's daughter appeared to be somewhat disturbed by the fact that these aneurysms have apparently been known to be  present with no action having been taken up until now. The natural history of unruptured intracranial aneurysms was reviewed with the patient's daughter. 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 Risk of rupture of 1-2% per year per aneurysm with attendant risks of severe mortality and morbidity were reviewed. Risk factors of increased risks of rupture and or enlargement or discussed, namely hypertension, smoking. Also discussed were strong family history especially females with intracranial aneurysms ruptured or unruptured. According to the patient's daughter there is no known family history of intracranial aneurysms. Options regarding management were those of continued medical management given the patient's general medical condition versus consideration of occlusion of the aneurysms from the circulations namely by endovascular means. The procedures were discussed with attendant risks and benefits. These procedures will require the patient to be compliant with at least of two medicines for a period of about 3-4 months and also would have to be undertaken under general anesthesia. Also since the aneurysms were in two separate arterial territories, this may require 2 separate interventions under general anesthesia. Risks of the procedure were all reviewed with patient's daughter. Questions were answered to her satisfaction in regards intracranial aneurysm  management. It was felt that further discussions with the rest of the family members would be extremely important in regards the direction of future management of these intracranial aneurysms given her overall medical condition. The patient's daughter was going to review this with the rest of the family and come to a final decision. In the meantime she was asked to call should she have any concerns or questions. PHYSICAL EXAMINATION: Problem Focused, System of  Complaint ASSESSMENT AND PLAN: Straight-forward Decision Making Electronically Signed   By: Julieanne Cotton M.D.   On: 05/24/2015 16:29    ECG  Sinus brady with TWI in V3-V6  ASSESSMENT AND PLAN  1. Recurrent syncope, orthostatic pressure is negative. She has baseline bradycardia on 25 twice a day of metoprolol  - Agree with cut back metoprolol dose to 12.5 mg twice a day to help with heart rate.  - Will obtain EMS strip tomorrow to see if there is any correlation between symptoms and rhythm  - Note patient had recent normal echo, chance of ventricular arrhythmia is low. She is a poor historian, no indication to pursue ischemic workup unless clear indication. I doubt   2. CAD s/p PCI to LAD 2000 3. Hypertension 4. Hyperlipidemia 5. CVA in 2010 6. recently diagnosed brain aneurysm  7. DVT on eliquis: repeat LE venous doppler in a month, consider stop eliquis if no further clot since risk of bleeding is high with plavix at the same time. Consider PT/OT   Signed, Azalee Course, PA-C 06/07/2015, 4:51 PM  Agree with note by Azalee Course PA-C  Pt is VERY poor historian. Hx as outlined. We are asked to see for ? Syncope which the pt doesn't really remember. Nl LV fxn. NSR. Nl carotid US. Recent DVT on NOAC with subsequent improvement. Exam benign. Doubt that we will determine etiology of presumed syncopal episode. May benefit from 30 day event monitor. Can probably be D/Cd home in AM.   Runell Gess, M.D., FACP, [facility], Kathryne Eriksson [facility] [address] [place], [place]  [address]  [phone] 06/07/2015 5:44 PM

## 2015-06-07 NOTE — Progress Notes (Signed)
Chaplain visited with patient while providing spiritual care rounds to the Unit.  The patient welcomed the visit as she had a opportunity to have someone to talk with as she reports her family is not able to visit as often as she desires due to work/ obligations. Chaplain offered ministry of presence, encouragement, and encouragement.  A follow up visit was welcomed for spiritual care support. Chaplain Janell Quiet [phone]

## 2015-06-07 NOTE — ED Provider Notes (Signed)
CSN: [id number]     Arrival date & time 06/05/15  2050 History   First MD Initiated Contact with Patient 06/05/15 2104     Chief Complaint  Patient presents with  . DVT  . Loss of Consciousness     (Consider location/radiation/quality/duration/timing/severity/associated sxs/prior Treatment) Patient is a 74 y.o. female presenting with syncope.  Loss of Consciousness Associated symptoms: no chest pain, no fever, no nausea, no shortness of breath, no vomiting and no weakness      [name] is a 75 y.o. female with a past medical history of hypertension, hyperlipidemia, brain aneurysm, CAD, CVA, dementia, who was recently diagnosed and admitted to this facility with a left lower extremity DVT and is coming today from [facility] for evaluation of a syncopal episode  Pt was sitting up today when daughter noticed her to repetetively syncopize.  The daughter also notes the pt appeared more pale than usual today.  Pt is poor history provider.  Patient is unable to offer any additional hx.  Past Medical History  Diagnosis Date  . Hypertension   . Hyperlipidemia   . Coronary artery disease 2000    STATUS POT [facility] [place] STENTING OF THE LAD   . CVA (cerebrovascular accident) (HCC) 2010  . Constipation   . Dementia   . DVT (deep venous thrombosis) (HCC)     LLE   Past Surgical History  Procedure Laterality Date  . None    . Coronary angioplasty with stent placement     Family History  Problem Relation Age of Onset  . Alzheimer's disease Mother    Social History  Substance Use Topics  . Smoking status: Former Smoker    Quit date: 05/01/2008  . Smokeless tobacco: Never Used  . Alcohol Use: No   OB History    No data available     Review of Systems  Constitutional: Negative for fever and chills.  HENT: Negative for nosebleeds.   Eyes: Negative for visual disturbance.  Respiratory: Negative for cough and shortness of breath.   Cardiovascular: Positive for syncope.  Negative for chest pain.  Gastrointestinal: Negative for nausea, vomiting, abdominal pain, diarrhea and constipation.  Genitourinary: Negative for dysuria.  Skin: Negative for rash.  Neurological: Positive for syncope. Negative for weakness.  All other systems reviewed and are negative.     Allergies  Seroquel  Home Medications   Prior to Admission medications   Medication Sig Start Date End Date Taking? Authorizing Provider  apixaban (ELIQUIS) 5 MG TABS tablet Take 1 tablet (5 mg total) by mouth 2 (two) times daily. 05/29/15  Yes Catarina Hartshorn, MD  ARICEPT 10 MG tablet TAKE 1 TABLET BY MOUTH EVERY DAY AT BEDTIME 04/29/15  Yes Micki Riley, MD  bisacodyl (DULCOLAX) 10 MG suppository Place 10 mg rectally daily as needed for moderate constipation.   Yes Historical Provider, MD  clopidogrel (PLAVIX) 75 MG tablet TAKE 1 TABLET BY MOUTH EVERY DAY 03/10/15  Yes Vesta Mixer, MD  LIPITOR 40 MG tablet TAKE 1 TABLET BY MOUTH EVERY DAY 05/10/15  Yes Vesta Mixer, MD  lisinopril (PRINIVIL,ZESTRIL) 10 MG tablet TAKE 1 TABLET EVERY DAY 03/10/15  Yes Vesta Mixer, MD  loperamide (IMODIUM A-D) 2 MG tablet Take 2 mg by mouth 3 (three) times daily as needed for diarrhea or loose stools.   Yes Historical Provider, MD  magnesium hydroxide (MILK OF MAGNESIA) 400 MG/5ML suspension Take 30 mLs by mouth daily as needed for mild constipation.  Yes Historical Provider, MD  metoprolol tartrate (LOPRESSOR) 25 MG tablet Take 25 mg by mouth 2 (two) times daily.   Yes Historical Provider, MD  nitroGLYCERIN (NITROSTAT) 0.4 MG SL tablet Place 1 tablet (0.4 mg total) under the tongue every 5 (five) minutes as needed for chest pain. 03/12/14  Yes Vesta Mixer, MD  Sodium Phosphates (RA SALINE ENEMA RE) Place 1 application rectally daily as needed (constipation).   Yes Historical Provider, MD  apixaban (ELIQUIS) 5 MG TABS tablet Take 2 tablets (10 mg total) by mouth 2 (two) times daily. 05/23/15   Catarina Hartshorn, MD   BP  119/37 mmHg  Pulse 57  Temp(Src) 98.1 F (36.7 C) (Oral)  Resp 16  Ht 5\' 2"  (1.575 m)  Wt 64.229 kg  BMI 25.89 kg/m2  SpO2 96% Physical Exam  Constitutional: No distress.  HENT:  Head: Normocephalic and atraumatic.  Eyes: EOM are normal. Pupils are equal, round, and reactive to light.  Neck: Normal range of motion. Neck supple.  Cardiovascular: Normal rate and intact distal pulses.   Pulmonary/Chest: No respiratory distress.  Abdominal: Soft. There is no tenderness.  Musculoskeletal: Normal range of motion. She exhibits edema (LLE appears swollen.  dopplerable L dp pulse).  Neurological: She is alert. She has normal strength. No cranial nerve deficit or sensory deficit. Coordination normal. GCS eye subscore is 4. GCS verbal subscore is 5. GCS motor subscore is 6.  Skin: No rash noted. She is not diaphoretic.  Psychiatric: She has a normal mood and affect.    ED Course  Procedures (including critical care time) Labs Review Labs Reviewed  CBC WITH DIFFERENTIAL/PLATELET - Abnormal; Notable for the following:    WBC 11.4 (*)    Neutro Abs 9.8 (*)    All other components within normal limits  BASIC METABOLIC PANEL - Abnormal; Notable for the following:    Glucose, Bld 134 (*)    All other components within normal limits  URINALYSIS, ROUTINE W REFLEX MICROSCOPIC (NOT AT [facility]) - Abnormal; Notable for the following:    APPearance CLOUDY (*)    Hgb urine dipstick SMALL (*)    All other components within normal limits  URINE MICROSCOPIC-ADD ON - Abnormal; Notable for the following:    Squamous Epithelial / LPF 0-5 (*)    Bacteria, UA FEW (*)    All other components within normal limits  COMPREHENSIVE METABOLIC PANEL - Abnormal; Notable for the following:    Total Protein 5.6 (*)    Albumin 3.1 (*)    All other components within normal limits  MRSA PCR SCREENING  TROPONIN I  TROPONIN I  CBC WITH DIFFERENTIAL/PLATELET  MAGNESIUM  I-STAT TROPOININ, ED  Rosezena Sensor, ED     Imaging Review Dg Chest 2 View  06/05/2015  CLINICAL DATA:  Syncope EXAM: CHEST  2 VIEW COMPARISON:  06/21/2011 FINDINGS: The heart size and mediastinal contours are within normal limits. Both lungs are clear. The visualized skeletal structures are unremarkable. IMPRESSION: No active cardiopulmonary disease. Electronically Signed   By: Marlan Palau M.D.   On: 06/05/2015 22:38   Ct Head Wo Contrast  06/06/2015  CLINICAL DATA:  Syncopal episode today at skilled nursing facility. Recent LEFT lower extremity deep vein thrombosis. History of hypertension, hyperlipidemia, dementia, stroke, brain aneurysm. EXAM: CT HEAD WITHOUT CONTRAST TECHNIQUE: Contiguous axial images were obtained from the base of the skull through the vertex without intravenous contrast. COMPARISON:  MRI of the brain May 08, 2015 FINDINGS: LEFT temporal parietal  encephalomalacia with ex vacuo dilatation of LEFT ventricle atrium and occipital horn, unchanged. Ventricles and sulci are otherwise normal for patient's age. Patchy to confluent supratentorial white matter hypodensities without midline shift, mass effect, intraparenchymal hemorrhage or acute large vascular territory infarcts. No abnormal extra-axial fluid collections. Basal cisterns are patent. Mild calcific atherosclerosis of the carotid siphons and included vertebral arteries. Old LEFT depressed nasal bone fracture. Paranasal sinuses and mastoid air cells are well aerated. No skull fracture. IMPRESSION: No acute intracranial process. Old large LEFT MCA territory infarct and moderate to severe chronic small vessel ischemic disease. Electronically Signed   By: Awilda Metro M.D.   On: 06/06/2015 23:40   I have personally reviewed and evaluated these images and lab results as part of my medical decision-making.   EKG Interpretation   Date/Time:  Saturday June 05 2015 21:18:48 EST Ventricular Rate:  61 PR Interval:  181 QRS Duration: 99 QT Interval:  509 QTC  Calculation: 513 R Axis:   -21 Text Interpretation:  Sinus rhythm Borderline left axis deviation overall  similar to previous Prolonged QT interval Confirmed by Jodi Mourning  MD, JOSHUA  ([id number]) on 06/05/2015 9:27:45 PM      MDM   Final diagnoses:  Left leg swelling  Syncope, unspecified syncope type     [name] is a 75 y.o. female with a past medical history of hypertension, hyperlipidemia, brain aneurysm, CAD, CVA, dementia, who was recently diagnosed and admitted to this facility with a left lower extremity DVT and is coming today from [facility] for evaluation of a syncopal episode  AFVSS.  Exam unremarkable.  No recent illnesses, no chest pain  Will pursue broad work up including cbc/bmp/ekg/trop/cxr/ua.  Cbc/bmp unremarkable.  ekg w/o ischemic changes.  Trop neg.  cxr wnl.  ua wnl Unknown etiology of syncope, will admit for syncope obs.  Patient has hx of previous dvt and is on blood thinners, doubt pe given normal vitals.    Silas Flood, MD 06/07/15 0230  Blane Ohara, MD 06/10/15 [phone]

## 2015-06-07 NOTE — Progress Notes (Addendum)
[facility] PROGRESS NOTE  Berdella Bacot HQI:[id number] DOB: Feb 22, 1941 DOA: 06/05/2015 PCP: Leanor Rubenstein, MD  Assessment/Plan: 1-Syncope:  Unclear etiology.  Troponin negative.  Had echo recently/ 05-09-2015 Will check Orthostatic vitals.  Ct head showed old stroke, EEG negative for seizure.  EKG with prolong QT, mg replaced.  Holder parameter for metoprolol.  Hold aricept.  Cardiology consulted/   2-Left LE DVT; Continue with eliquis.  Repeated doppler with improved Left LE DVT/   3-FTT;  Patient with fatigue, malaise. Loose stool.  No further diarrhea. Will order ensure.   CAD, Chest pressure. ; resolved. Troponin negative.  Patient on metoprolol, statin, eliquis.   Hyperlipidemia Continue atorvastatin 40 mg by mouth daily.   Dementia with behavioral disturbance Hold Aricept due to bradyxcardia   Left leg DVT (HCC) Worsening edema is reported by the patient's daughter. Continue Apixaban 5 mg by mouth twice a day. Recheck venous duplex of LLE ordered earlier by the ED, will follow results.  Code Status: Full Code.  Family Communication: try to contact daughter  Disposition Plan: SNF in 24 hours.    Consultants:  none  Procedures:  Doppler;   Antibiotics:  none  HPI/Subjective: Patient is alert, pleasantly confused.    Objective: Filed Vitals:   06/07/15 1010 06/07/15 1406  BP:  134/55  Pulse: 61 88  Temp:  98.7 F (37.1 C)  Resp:  17    Intake/Output Summary (Last 24 hours) at 06/07/15 1731 Last data filed at 06/06/15 1848  Gross per 24 hour  Intake    320 ml  Output      0 ml  Net    320 ml   Filed Weights   06/06/15 0104  Weight: 64.229 kg (141 lb 9.6 oz)    Exam:   General:  NAD  Cardiovascular: S 1, S 2 RRR  Respiratory: CTA  Abdomen: BS present, soft, nt  Musculoskeletal: trace edema   Data Reviewed: Basic Metabolic Panel:  Recent Labs Lab 06/05/15 2139 06/06/15 0956 06/06/15 1341  NA 141 145  --   K 4.3  4.1  --   CL 104 110  --   CO2 25 27  --   GLUCOSE 134* 97  --   BUN 17 12  --   CREATININE 0.62 0.64  --   CALCIUM 9.5 9.1  --   MG  --   --  1.8   Liver Function Tests:  Recent Labs Lab 06/06/15 0956  AST 25  ALT 19  ALKPHOS 70  BILITOT 1.2  PROT 5.6*  ALBUMIN 3.1*   No results for input(s): LIPASE, AMYLASE in the last 168 hours. No results for input(s): AMMONIA in the last 168 hours. CBC:  Recent Labs Lab 06/05/15 2139 06/06/15 0956  WBC 11.4* 6.4  NEUTROABS 9.8* 4.2  HGB 13.2 13.2  HCT 38.8 38.5  MCV 96.0 96.3  PLT 240 226   Cardiac Enzymes:  Recent Labs Lab 06/06/15 0333 06/06/15 0956  TROPONINI <0.03 <0.03   BNP (last 3 results) No results for input(s): BNP in the last 8760 hours.  ProBNP (last 3 results) No results for input(s): PROBNP in the last 8760 hours.  CBG: No results for input(s): GLUCAP in the last 168 hours.  Recent Results (from the past 240 hour(s))  MRSA PCR Screening     Status: None   Collection Time: 06/06/15  1:45 AM  Result Value Ref Range Status   MRSA by PCR NEGATIVE NEGATIVE Final    Comment:  The GeneXpert MRSA Assay (FDA approved for NASAL specimens only), is one component of a comprehensive MRSA colonization surveillance program. It is not intended to diagnose MRSA infection nor to guide or monitor treatment for MRSA infections.      Studies: Dg Chest 2 View  06/05/2015  CLINICAL DATA:  Syncope EXAM: CHEST  2 VIEW COMPARISON:  06/21/2011 FINDINGS: The heart size and mediastinal contours are within normal limits. Both lungs are clear. The visualized skeletal structures are unremarkable. IMPRESSION: No active cardiopulmonary disease. Electronically Signed   By: Marlan Palau M.D.   On: 06/05/2015 22:38   Ct Head Wo Contrast  06/06/2015  CLINICAL DATA:  Syncopal episode today at skilled nursing facility. Recent LEFT lower extremity deep vein thrombosis. History of hypertension, hyperlipidemia, dementia,  stroke, brain aneurysm. EXAM: CT HEAD WITHOUT CONTRAST TECHNIQUE: Contiguous axial images were obtained from the base of the skull through the vertex without intravenous contrast. COMPARISON:  MRI of the brain May 08, 2015 FINDINGS: LEFT temporal parietal encephalomalacia with ex vacuo dilatation of LEFT ventricle atrium and occipital horn, unchanged. Ventricles and sulci are otherwise normal for patient's age. Patchy to confluent supratentorial white matter hypodensities without midline shift, mass effect, intraparenchymal hemorrhage or acute large vascular territory infarcts. No abnormal extra-axial fluid collections. Basal cisterns are patent. Mild calcific atherosclerosis of the carotid siphons and included vertebral arteries. Old LEFT depressed nasal bone fracture. Paranasal sinuses and mastoid air cells are well aerated. No skull fracture. IMPRESSION: No acute intracranial process. Old large LEFT MCA territory infarct and moderate to severe chronic small vessel ischemic disease. Electronically Signed   By: Awilda Metro M.D.   On: 06/06/2015 23:40    Scheduled Meds: . apixaban  5 mg Oral BID  . atorvastatin  40 mg Oral Daily  . clopidogrel  75 mg Oral Daily  . lisinopril  10 mg Oral Daily  . metoprolol tartrate  12.5 mg Oral BID  . sodium chloride flush  3 mL Intravenous Q12H   Continuous Infusions:    Principal Problem:   Syncope Active Problems:   Hypertension   Hyperlipidemia   Dementia with behavioral disturbance   Left leg DVT (HCC)   Chest pressure    Time spent: 25 minutes.     Hartley Barefoot A  [facility] Pager [phone]. If 7PM-7AM, please contact night-coverage at [email or web address], password [facility] 06/07/2015, 5:31 PM

## 2015-06-07 NOTE — Clinical Social Work Note (Signed)
Clinical Social Work Assessment  Patient Details  Name: [name] MRN: [id number] Date of Birth: February 23, 1941  Date of referral:  06/07/15               Reason for consult:  Facility Placement                Permission sought to share information with:  Facility Medical sales representative, Family Supports Permission granted to share information::  Yes, Verbal Permission Granted  Name::     Jerral Bonito, [phone]  Agency::  SNF admissions  Relationship::     Contact Information:     Housing/Transportation Living arrangements for the past 2 months:  Skilled Nursing Facility Source of Information:  Patient Patient Interpreter Needed:  None Criminal Activity/Legal Involvement Pertinent to Current Situation/Hospitalization:  No - Comment as needed Significant Relationships:  Adult Children Lives with:  Facility Resident Do you feel safe going back to the place where you live?  Yes Need for family participation in patient care:  No (Coment)  Care giving concerns: Patient did not express any concerns with her care at [facility]   Social Worker assessment / plan:  Patient is a 75 year old female who is from [facility]  Patient was receiving rehab and then had to be readmitted to hospital.  Patient stated she hopes to continue to improve with physical therapy so she can return back home.  Patient states she is looking forward to discharging back to [facility] so she can continue her therapy.  Patient expressed that she has been at [facility] and did not have any questions about returning back to SNF.  Employment status:  Retired Database administrator PT Recommendations:  Skilled Nursing Facility Information / Referral to community resources:  Skilled Nursing Facility  Patient/Family's Response to care:  Patient in agreement to returning to SNF  Patient/Family's Understanding of and Emotional Response to Diagnosis, Current Treatment, and  Prognosis:  Patient is aware of current treatment and prognosis.    Emotional Assessment Appearance:  Appears stated age Attitude/Demeanor/Rapport:    Affect (typically observed):  Appropriate, Calm, Stable Orientation:  Oriented to Self, Oriented to Place, Oriented to  Time Alcohol / Substance use:  Not Applicable Psych involvement (Current and /or in the community):  No (Comment)  Discharge Needs  Concerns to be addressed:  No discharge needs identified Readmission within the last 30 days:  Yes (05/24/2015 [name]) Current discharge risk:  None Barriers to Discharge:  No Barriers Identified   Darleene Cleaver, LCSWA 06/07/2015, 5:53 PM

## 2015-06-07 NOTE — Clinical Social Work Note (Signed)
CSW spoke to patient's daughter [name] to confirm that patient will be returning to [facility] once she is medically ready for discharge and orders have been received.  CSW to continue to follow patient's progress, updated clinicals were sent to SNF, [facility] awaiting signature by physician.  Ervin Knack. Charlottie Peragine, MSW, Theresia Majors [phone] 06/07/2015 6:08 PM

## 2015-06-07 NOTE — Discharge Summary (Signed)
Physician Discharge Summary  [name] ZOX:[id number] DOB: 16-May-1940 DOA: 06/05/2015  PCP: Leanor Rubenstein, MD  Admit date: 06/05/2015 Discharge date: 06/08/2015  Time spent: 35 minutes  Recommendations for Outpatient Follow-up:  Follow up with neurology out patient.  Follow up with primary neurology , might need prolong EEG  Discharge Diagnoses:    Syncope   Hypertension   Hyperlipidemia   Dementia with behavioral disturbance   Left leg DVT (HCC)   Chest pressure   Discharge Condition: stable  Diet recommendation: heart healthy   Filed Weights   06/06/15 0104  Weight: 64.229 kg (141 lb 9.6 oz)    History of present illness:  [name] is a 75 y.o. female with a past medical history of hypertension, hyperlipidemia, brain aneurysm, CAD, CVA, dementia, who was recently diagnosed and admitted to this facility with a left lower extremity DVT and is coming today from [facility] for evaluation of a syncopal episode and other complaints.  Per patient's daughter, she has been having worsening fatigue, malaise, decreased appetite since yesterday. She has been complaining of nausea with intense dry heaves and has had at least 2 episodes of loose stool since yesterday. Her appetite has been decreased, the patient denies having abdominal pain or vomiting. There have not been melena or hematochezia.  Her daughter states that today, while she was in the nursing home and on route to the hospital in the ambulance,the patient experienced several syncopal episodes lasting between 20 seconds to a minute or 2, followed by a few minutes of disorientation. After the last episode in the ambulance, she was having tremors, which have subsequently resolved.   Her daughter also states, that the patient felt short of breath and put her hands in her chest for a while in the nursing home, but the details of these symptoms are not totally clear. When asked, in the ED room, the patient denied having  any chest pain or chest pressure at that time. She was in no acute distress and seemed to be doing better after she was given antiemetics and IV hydration. Workup in the ER so far has only show mild hyperglycemia and mild left shifted leukocytosis.  Hospital Course:  1-Syncope: multiple episodes per daughter. Unclear etiology.  Troponin negative. Had echo recently/ 05-09-2015 Orthostatic vitals negative.   CT head only showed old stroke, and EEG negative for seizures.  Cardiology consulted, EMS strips reviewed, per cardiology no further cardiac work, no evidence of arrhythmia.  Discussed case with neurology, no real evidence of seizure, patient to follow up with primary neurology   2-Left LE DVT; Continue with eliquis.  Repeated doppler with improved Left LE DVT/   3-FTT;  Patient with fatigue, malaise. Loose stool.  No further diarrhea. Continue with  ensure.   CAD, Chest pressure. ; resolved. Troponin negative.  Patient on metoprolol, statin, eliquis.  Resume lisinopril SBP increasing.   Bradycardia; metoprolol decrease to 12.5 mg BID. Hold  Aricept.   Hyperlipidemia Continue atorvastatin 40 mg by mouth daily.   Dementia with behavioral disturbance Hold Aricept due to bradyxcardia   Left leg DVT (HCC) Worsening edema is reported by the patient's daughter. Continue Apixaban 5 mg by mouth twice a day. Doppler stable, only small persistent clot.   Procedures:  EEG; This is an abnormal EEG demonstrating a mild diffuse slowing of electrocerebral activity. This can be seen in a wide variety of encephalopathic state including those of a toxic, metabolic, or degenerative nature. There were no focal, hemispheric,  or lateralizing features. No epileptiform activity was recorded.  Consultations:  Cardiology   Discharge Exam: Filed Vitals:   06/07/15 2024 06/08/15 0408  BP: 113/53 114/55  Pulse: 61 50  Temp: 98.3 F (36.8 C) 98 F (36.7 C)  Resp: 18 18    General:  NAD Cardiovascular: S 1, S 2 RRR Respiratory: CTA  Discharge Instructions   Discharge Instructions    Diet - low sodium heart healthy    Complete by:  As directed      Increase activity slowly    Complete by:  As directed           Current Discharge Medication List    CONTINUE these medications which have CHANGED   Details  metoprolol tartrate (LOPRESSOR) 25 MG tablet Take 0.5 tablets (12.5 mg total) by mouth 2 (two) times daily. Qty: 30 tablet, Refills: 0      CONTINUE these medications which have NOT CHANGED   Details  apixaban (ELIQUIS) 5 MG TABS tablet Take 1 tablet (5 mg total) by mouth 2 (two) times daily. Qty: 60 tablet, Refills: 0    bisacodyl (DULCOLAX) 10 MG suppository Place 10 mg rectally daily as needed for moderate constipation.    clopidogrel (PLAVIX) 75 MG tablet TAKE 1 TABLET BY MOUTH EVERY DAY Qty: 15 tablet, Refills: 0    LIPITOR 40 MG tablet TAKE 1 TABLET BY MOUTH EVERY DAY Qty: 30 tablet, Refills: 6    lisinopril (PRINIVIL,ZESTRIL) 10 MG tablet TAKE 1 TABLET EVERY DAY Qty: 15 tablet, Refills: 0    magnesium hydroxide (MILK OF MAGNESIA) 400 MG/5ML suspension Take 30 mLs by mouth daily as needed for mild constipation.    nitroGLYCERIN (NITROSTAT) 0.4 MG SL tablet Place 1 tablet (0.4 mg total) under the tongue every 5 (five) minutes as needed for chest pain. Qty: 25 tablet, Refills: 12      STOP taking these medications     ARICEPT 10 MG tablet      loperamide (IMODIUM A-D) 2 MG tablet      Sodium Phosphates (RA SALINE ENEMA RE)        Allergies  Allergen Reactions  . Seroquel [Quetiapine Fumarate] Other (See Comments)    Next day after taking medication, patient was hard to wake up from her sleep   Follow-up Information    Schedule an appointment as soon as possible for a visit with Leanor Rubenstein, MD.   Specialty:  Family Medicine   Contact information:   [address] [place] [address] [phone]       Follow  up with Nahser, Deloris Ping, MD On 07/07/2015.   Specialty:  Cardiology   Why:  9:00AM. Cardiology followup   Contact information:   [address] [place] [address] [phone]       Follow up with SETHI,PRAMOD, MD In 1 week.   Specialties:  Neurology, Radiology   Contact information:   [address] [place] [address] [phone]        The results of significant diagnostics from this hospitalization (including imaging, microbiology, ancillary and laboratory) are listed below for reference.    Significant Diagnostic Studies: Dg Chest 2 View  06/05/2015  CLINICAL DATA:  Syncope EXAM: CHEST  2 VIEW COMPARISON:  06/21/2011 FINDINGS: The heart size and mediastinal contours are within normal limits. Both lungs are clear. The visualized skeletal structures are unremarkable. IMPRESSION: No active cardiopulmonary disease. Electronically Signed   By: Marlan Palau  M.D.   On: 06/05/2015 22:38   Ct Head Wo Contrast  06/06/2015  CLINICAL DATA:  Syncopal episode today at skilled nursing facility. Recent LEFT lower extremity deep vein thrombosis. History of hypertension, hyperlipidemia, dementia, stroke, brain aneurysm. EXAM: CT HEAD WITHOUT CONTRAST TECHNIQUE: Contiguous axial images were obtained from the base of the skull through the vertex without intravenous contrast. COMPARISON:  MRI of the brain May 08, 2015 FINDINGS: LEFT temporal parietal encephalomalacia with ex vacuo dilatation of LEFT ventricle atrium and occipital horn, unchanged. Ventricles and sulci are otherwise normal for patient's age. Patchy to confluent supratentorial white matter hypodensities without midline shift, mass effect, intraparenchymal hemorrhage or acute large vascular territory infarcts. No abnormal extra-axial fluid collections. Basal cisterns are patent. Mild calcific atherosclerosis of the carotid siphons and included vertebral arteries. Old LEFT depressed nasal bone fracture.  Paranasal sinuses and mastoid air cells are well aerated. No skull fracture. IMPRESSION: No acute intracranial process. Old large LEFT MCA territory infarct and moderate to severe chronic small vessel ischemic disease. Electronically Signed   By: Awilda Metro M.D.   On: 06/06/2015 23:40   Dg Knee Complete 4 Views Right  05/22/2015  CLINICAL DATA:  Acute onset of left knee pain and bruising. Initial encounter. EXAM: RIGHT KNEE - COMPLETE 4+ VIEW COMPARISON:  None. FINDINGS: There is no evidence of fracture or dislocation. The joint spaces are preserved. Marginal osteophytes are seen arising at the medial compartment, and mild osteophyte formation is noted at the tibial spine. A small knee joint effusion is noted. The visualized soft tissues are otherwise unremarkable in appearance. IMPRESSION: 1. No evidence of fracture or dislocation. 2. 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 3. Small knee joint effusion noted. Electronically Signed   By: Roanna Raider M.D.   On: 05/22/2015 01:01   Dg Foot Complete Right  05/10/2015  CLINICAL DATA:  Right foot pain. Possible fall 3-4 days ago. Midfoot bruising changes. EXAM: RIGHT FOOT COMPLETE - 3+ VIEW COMPARISON:  None. FINDINGS: Moderate midfoot degenerative changes with possible erosive findings. No acute fracture is identified. Hallux valgus deformity and degenerative change at the first metatarsal phalangeal joint. IMPRESSION: Midfoot degenerative changes but no acute fracture. Electronically Signed   By: Rudie Meyer M.D.   On: 05/10/2015 09:44   Ir Radiologist Eval & Mgmt  05/28/2015  EXAM: NEW PATIENT OFFICE VISIT CHIEF COMPLAINT: History of intracranial aneurysms. Current Pain Level: 1-10 HISTORY OF PRESENT ILLNESS: The patient is a 75 year old, right-handed lady referred for evaluation of treatment of intracranial aneurysms, which were seen on a recent MRI/MRA of the brain during her recent admission. The patient herself could not physically be  present. However the patient's daughter, Carlyle Basques, was available for consultation. The patient's daughter apparently is aware of the 2 aneurysms in the brain in her mom. The presence of aneurysm apparently dates to her previous MRI/MRAs performed. Apparently there was reportedly a slight increase in the size of the aneurysms. The patient's daughter reports that her mother suffers from Alzheimer's dementia with her mother requiring constant supervision. However she reports this is only gotten recently worse. The patient's daughter appeared to be somewhat disturbed by the fact that these aneurysms have apparently been known to be present with no action having been taken up until now. The natural history of unruptured intracranial aneurysms was reviewed with the patient's daughter. 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 Risk of  rupture of 1-2% per year per aneurysm with attendant risks of severe mortality and morbidity were reviewed. Risk factors of increased risks of rupture and or enlargement or discussed, namely hypertension, smoking. Also discussed were strong family history especially females with intracranial aneurysms ruptured or unruptured. According to the patient's daughter there is no known family history of intracranial aneurysms. Options regarding management were those of continued medical management given the patient's general medical condition versus consideration of occlusion of the aneurysms from the circulations namely by endovascular means. The procedures were discussed with attendant risks and benefits. These procedures will require the patient to be compliant with at least of two medicines for a period of about 3-4 months and also would have to be undertaken under general anesthesia. Also since the aneurysms were in two separate arterial territories, this may require 2 separate  interventions under general anesthesia. Risks of the procedure were all reviewed with patient's daughter. Questions were answered to her satisfaction in regards intracranial aneurysm management. It was felt that further discussions with the rest of the family members would be extremely important in regards the direction of future management of these intracranial aneurysms given her overall medical condition. The patient's daughter was going to review this with the rest of the family and come to a final decision. In the meantime she was asked to call should she have any concerns or questions. PHYSICAL EXAMINATION: Problem Focused, System of Complaint ASSESSMENT AND PLAN: Straight-forward Decision Making Electronically Signed   By: Julieanne Cotton M.D.   On: 05/24/2015 16:29    Microbiology: Recent Results (from the past 240 hour(s))  MRSA PCR Screening     Status: None   Collection Time: 06/06/15  1:45 AM  Result Value Ref Range Status   MRSA by PCR NEGATIVE NEGATIVE Final    Comment:        The GeneXpert MRSA Assay (FDA approved for NASAL specimens only), is one component of a comprehensive MRSA colonization surveillance program. It is not intended to diagnose MRSA infection nor to guide or monitor treatment for MRSA infections.      Labs: Basic Metabolic Panel:  Recent Labs Lab 06/05/15 2139 06/06/15 0956 06/06/15 1341  NA 141 145  --   K 4.3 4.1  --   CL 104 110  --   CO2 25 27  --   GLUCOSE 134* 97  --   BUN 17 12  --   CREATININE 0.62 0.64  --   CALCIUM 9.5 9.1  --   MG  --   --  1.8   Liver Function Tests:  Recent Labs Lab 06/06/15 0956  AST 25  ALT 19  ALKPHOS 70  BILITOT 1.2  PROT 5.6*  ALBUMIN 3.1*   No results for input(s): LIPASE, AMYLASE in the last 168 hours. No results for input(s): AMMONIA in the last 168 hours. CBC:  Recent Labs Lab 06/05/15 2139 06/06/15 0956  WBC 11.4* 6.4  NEUTROABS 9.8* 4.2  HGB 13.2 13.2  HCT 38.8 38.5  MCV 96.0  96.3  PLT 240 226   Cardiac Enzymes:  Recent Labs Lab 06/06/15 0333 06/06/15 0956  TROPONINI <0.03 <0.03   BNP: BNP (last 3 results) No results for input(s): BNP in the last 8760 hours.  ProBNP (last 3 results) No results for input(s): PROBNP in the last 8760 hours.  CBG: No results for input(s): GLUCAP in the last 168 hours.     Signed:  Alba Cory MD.  [facility]  06/08/2015, 10:07 AM

## 2015-06-07 NOTE — NC FL2 (Signed)
[place] MEDICAID FL2 LEVEL OF CARE SCREENING TOOL     IDENTIFICATION  Patient Name: [name] Birthdate: November 20, 1940 Sex: female Admission Date (Current Location): 06/05/2015  [facility] and [place] Number:      Facility and Address:  [facility], [address], [place], [place] [address]      Provider Number: [id number]  Attending Physician Name and Address:  Alba Cory, MD  Relative Name and Phone Number:      Jerral Bonito, Daughter [phone]           Current Level of Care: Hospital Recommended Level of Care: Skilled Nursing Facility Prior Approval Number:    Date Approved/Denied:   PASRR Number: [id number]  Discharge Plan: SNF    Current Diagnoses: Patient Active Problem List   Diagnosis Date Noted  . Chest pressure 06/06/2015  . Syncope 06/05/2015  . 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 DVT (deep venous thrombosis), left 05/22/2015  . Left leg DVT (HCC) 05/22/2015  . Bradycardia 05/16/2015  . UTI (urinary tract infection) 05/11/2015  . 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 Stroke with cerebral ischemia (HCC)   . Weakness 05/08/2015  . TIA (transient ischemic attack) 05/08/2015  . Dementia with behavioral disturbance 11/20/2013  . Agitation 11/20/2013  . Hypertension   . Hyperlipidemia   . 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 Coronary artery disease     Orientation RESPIRATION BLADDER Height & Weight     Self, Time, Situation, Place  Normal Incontinent Weight: 141 lb 9.6 oz (64.229 kg) Height:   (157.5 cm)  BEHAVIORAL SYMPTOMS/MOOD NEUROLOGICAL BOWEL NUTRITION STATUS      Continent Diet (Regular)  AMBULATORY STATUS COMMUNICATION OF NEEDS Skin   Limited Assist Verbally Normal                       Personal Care Assistance Level of Assistance  Dressing Bathing Assistance: Limited assistance   Dressing Assistance: Limited assistance      Functional Limitations Info    Sight Info: Adequate Hearing Info: Adequate Speech Info: Adequate    SPECIAL CARE FACTORS FREQUENCY  PT (By licensed PT)     PT Frequency: 5x a week              Contractures      Additional Factors Info  Code Status, Allergies Code Status Info: Full Code Allergies Info: Seroquel           Current Medications (06/07/2015):  This is the current hospital active medication list Current Facility-Administered Medications  Medication Dose Route Frequency Provider Last Rate Last Dose  . acetaminophen (TYLENOL) tablet 650 mg  650 mg Oral Q6H PRN Bobette Mo, MD      . apixaban Everlene Balls) tablet 5 mg  5 mg Oral BID Bobette Mo, MD   5 mg at 06/07/15 1006  . atorvastatin (LIPITOR) tablet 40 mg  40 mg Oral Daily Bobette Mo, MD   40 mg at 06/07/15 1007  . clopidogrel (PLAVIX) tablet 75 mg  75 mg Oral Daily Bobette Mo, MD   75 mg at 06/07/15 1006  . lisinopril (PRINIVIL,ZESTRIL) tablet 10 mg  10 mg Oral Daily Belkys A Regalado, MD   10 mg at 06/07/15 1348  . metoprolol tartrate (LOPRESSOR) tablet 12.5 mg  12.5 mg Oral BID Belkys A Regalado, MD   12.5 mg at 06/07/15 1010  . nitroGLYCERIN (NITROSTAT)  SL tablet 0.4 mg  0.4 mg Sublingual Q5 min PRN Bobette Mo, MD      . ondansetron [facility]) tablet 4 mg  4 mg Oral Q6H PRN Bobette Mo, MD       Or  . ondansetron [facility]) injection 4 mg  4 mg Intravenous Q6H PRN Bobette Mo, MD      . sodium chloride flush (NS) 0.9 % injection 3 mL  3 mL Intravenous Q12H Bobette Mo, MD   3 mL at 06/06/15 0115     Discharge Medications: Please see discharge summary for a list of discharge medications.  Relevant Imaging Results:  Relevant Lab Results:   Additional Information SSN [id number]  [place]

## 2015-06-07 NOTE — Progress Notes (Signed)
EEG Completed; Results Pending  

## 2015-06-08 ENCOUNTER — Encounter (HOSPITAL_COMMUNITY): Payer: Self-pay | Admitting: General Practice

## 2015-06-08 DIAGNOSIS — R0789 Other chest pain: Secondary | ICD-10-CM | POA: Diagnosis not present

## 2015-06-08 DIAGNOSIS — F0391 Unspecified dementia with behavioral disturbance: Secondary | ICD-10-CM | POA: Diagnosis not present

## 2015-06-08 DIAGNOSIS — F488 Other specified nonpsychotic mental disorders: Secondary | ICD-10-CM | POA: Diagnosis not present

## 2015-06-08 MED ORDER — METOPROLOL TARTRATE 25 MG PO TABS: 12.5000 mg | ORAL_TABLET | Freq: Two times a day (BID) | ORAL | Status: DC

## 2015-06-08 NOTE — Progress Notes (Addendum)
Patient in stable condition Cardiac monitor dc ccmd notified, IV taken out.  Pt discharged and  [facility] Tarri Glenn, RN

## 2015-06-08 NOTE — Progress Notes (Signed)
Reported called to [facility]. Tarri Glenn, RN

## 2015-06-08 NOTE — Progress Notes (Signed)
EMS EKG reviewed which showed NST without any arrhythmia or prolonged pauses. No further cardiac workup. There is no evidence that her syncope was caused by cardiac issue. No need to event monitor.  Ramond Dial PA Pager: [phone]

## 2015-06-08 NOTE — Discharge Summary (Signed)
Physician Discharge Summary  [name] ZOX:[id number] DOB: 1941-04-07 DOA: 06/05/2015  PCP: Leanor Rubenstein, MD  Admit date: 06/05/2015 Discharge date: 06/08/2015  Time spent: 35 minutes  Recommendations for Outpatient Follow-up:  Follow up with neurology out patient.  Follow up with primary neurology , might need prolong EEG  Discharge Diagnoses:    Syncope   Hypertension   Hyperlipidemia   Dementia with behavioral disturbance   Left leg DVT (HCC)   Chest pressure   Discharge Condition: stable  Diet recommendation: heart healthy   Filed Weights   06/06/15 0104  Weight: 64.229 kg (141 lb 9.6 oz)    History of present illness:  [name] is a 75 y.o. female with a past medical history of hypertension, hyperlipidemia, brain aneurysm, CAD, CVA, dementia, who was recently diagnosed and admitted to this facility with a left lower extremity DVT and is coming today from [facility] for evaluation of a syncopal episode and other complaints.  Per patient's daughter, she has been having worsening fatigue, malaise, decreased appetite since yesterday. She has been complaining of nausea with intense dry heaves and has had at least 2 episodes of loose stool since yesterday. Her appetite has been decreased, the patient denies having abdominal pain or vomiting. There have not been melena or hematochezia.  Her daughter states that today, while she was in the nursing home and on route to the hospital in the ambulance,the patient experienced several syncopal episodes lasting between 20 seconds to a minute or 2, followed by a few minutes of disorientation. After the last episode in the ambulance, she was having tremors, which have subsequently resolved.   Her daughter also states, that the patient felt short of breath and put her hands in her chest for a while in the nursing home, but the details of these symptoms are not totally clear. When asked, in the ED room, the patient denied having  any chest pain or chest pressure at that time. She was in no acute distress and seemed to be doing better after she was given antiemetics and IV hydration. Workup in the ER so far has only show mild hyperglycemia and mild left shifted leukocytosis.  Hospital Course:  1-Syncope: multiple episodes per daughter. Unclear etiology.  Troponin negative. Had echo recently/ 05-09-2015 Orthostatic vitals negative.   CT head only showed old stroke, and EEG negative for seizures.  Cardiology consulted, EMS strips reviewed, per cardiology no further cardiac work, no evidence of arrhythmia.  Discussed case with neurology, no real evidence of seizure, patient to follow up with primary neurology  Will hold lisinopril , SBP in the 110 range.   2-Left LE DVT; Continue with eliquis.  Repeated doppler with improved Left LE DVT/   3-FTT;  Patient with fatigue, malaise. Loose stool.  No further diarrhea. Continue with  ensure.   CAD, Chest pressure. ; resolved. Troponin negative.  Patient on metoprolol, statin, eliquis.  Hold lisinopril SP in the 100   Bradycardia; metoprolol decrease to 12.5 mg BID. Hold  Aricept.   Hyperlipidemia Continue atorvastatin 40 mg by mouth daily.   Dementia with behavioral disturbance Hold Aricept due to bradyxcardia   Left leg DVT (HCC) Worsening edema is reported by the patient's daughter. Continue Apixaban 5 mg by mouth twice a day. Doppler stable, only small persistent clot.   Procedures:  EEG; This is an abnormal EEG demonstrating a mild diffuse slowing of electrocerebral activity. This can be seen in a wide variety of encephalopathic state including those  of a toxic, metabolic, or degenerative nature. There were no focal, hemispheric, or lateralizing features. No epileptiform activity was recorded.  Consultations:  Cardiology   Discharge Exam: Filed Vitals:   06/07/15 2024 06/08/15 0408  BP: 113/53 114/55  Pulse: 61 50  Temp: 98.3 F (36.8 C) 98  F (36.7 C)  Resp: 18 18    General: NAD Cardiovascular: S 1, S 2 RRR Respiratory: CTA  Discharge Instructions   Discharge Instructions    Diet - low sodium heart healthy    Complete by:  As directed      Increase activity slowly    Complete by:  As directed           Current Discharge Medication List    CONTINUE these medications which have CHANGED   Details  metoprolol tartrate (LOPRESSOR) 25 MG tablet Take 0.5 tablets (12.5 mg total) by mouth 2 (two) times daily. Qty: 30 tablet, Refills: 0      CONTINUE these medications which have NOT CHANGED   Details  apixaban (ELIQUIS) 5 MG TABS tablet Take 1 tablet (5 mg total) by mouth 2 (two) times daily. Qty: 60 tablet, Refills: 0    bisacodyl (DULCOLAX) 10 MG suppository Place 10 mg rectally daily as needed for moderate constipation.    clopidogrel (PLAVIX) 75 MG tablet TAKE 1 TABLET BY MOUTH EVERY DAY Qty: 15 tablet, Refills: 0    LIPITOR 40 MG tablet TAKE 1 TABLET BY MOUTH EVERY DAY Qty: 30 tablet, Refills: 6    magnesium hydroxide (MILK OF MAGNESIA) 400 MG/5ML suspension Take 30 mLs by mouth daily as needed for mild constipation.    nitroGLYCERIN (NITROSTAT) 0.4 MG SL tablet Place 1 tablet (0.4 mg total) under the tongue every 5 (five) minutes as needed for chest pain. Qty: 25 tablet, Refills: 12      STOP taking these medications     ARICEPT 10 MG tablet      lisinopril (PRINIVIL,ZESTRIL) 10 MG tablet      loperamide (IMODIUM A-D) 2 MG tablet      Sodium Phosphates (RA SALINE ENEMA RE)        Allergies  Allergen Reactions  . Seroquel [Quetiapine Fumarate] Other (See Comments)    Next day after taking medication, patient was hard to wake up from her sleep   Follow-up Information    Schedule an appointment as soon as possible for a visit with Leanor Rubenstein, MD.   Specialty:  Family Medicine   Contact information:   [address] [place] [address] [phone]       Follow up with  Nahser, Deloris Ping, MD On 07/07/2015.   Specialty:  Cardiology   Why:  9:00AM. Cardiology followup   Contact information:   [address] [place] [address] [phone]       Follow up with SETHI,PRAMOD, MD In 1 week.   Specialties:  Neurology, Radiology   Contact information:   [address] [place] [address] [phone]       Follow up with [facility] .   Specialty:  Skilled Nursing Facility   Contact information:   [address] [name] [address] [phone]       The results of significant diagnostics from this hospitalization (including imaging, microbiology, ancillary and laboratory) are listed below for reference.    Significant Diagnostic Studies: Dg Chest 2 View  06/05/2015  CLINICAL DATA:  Syncope EXAM: CHEST  2 VIEW COMPARISON:  06/21/2011 FINDINGS: The heart size and mediastinal contours are within normal limits. Both lungs are clear. The visualized skeletal structures are unremarkable. IMPRESSION: No active cardiopulmonary disease. Electronically Signed   By: Marlan Palau M.D.   On: 06/05/2015 22:38   Ct Head Wo Contrast  06/06/2015  CLINICAL DATA:  Syncopal episode today at skilled nursing facility. Recent LEFT lower extremity deep vein thrombosis. History of hypertension, hyperlipidemia, dementia, stroke, brain aneurysm. EXAM: CT HEAD WITHOUT CONTRAST TECHNIQUE: Contiguous axial images were obtained from the base of the skull through the vertex without intravenous contrast. COMPARISON:  MRI of the brain May 08, 2015 FINDINGS: LEFT temporal parietal encephalomalacia with ex vacuo dilatation of LEFT ventricle atrium and occipital horn, unchanged. Ventricles and sulci are otherwise normal for patient's age. Patchy to confluent supratentorial white matter hypodensities without midline shift, mass effect, intraparenchymal hemorrhage or acute large vascular territory infarcts. No abnormal extra-axial  fluid collections. Basal cisterns are patent. Mild calcific atherosclerosis of the carotid siphons and included vertebral arteries. Old LEFT depressed nasal bone fracture. Paranasal sinuses and mastoid air cells are well aerated. No skull fracture. IMPRESSION: No acute intracranial process. Old large LEFT MCA territory infarct and moderate to severe chronic small vessel ischemic disease. Electronically Signed   By: Awilda Metro M.D.   On: 06/06/2015 23:40   Dg Knee Complete 4 Views Right  05/22/2015  CLINICAL DATA:  Acute onset of left knee pain and bruising. Initial encounter. EXAM: RIGHT KNEE - COMPLETE 4+ VIEW COMPARISON:  None. FINDINGS: There is no evidence of fracture or dislocation. The joint spaces are preserved. Marginal osteophytes are seen arising at the medial compartment, and mild osteophyte formation is noted at the tibial spine. A small knee joint effusion is noted. The visualized soft tissues are otherwise unremarkable in appearance. IMPRESSION: 1. No evidence of fracture or dislocation. 2. 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 3. Small knee joint effusion noted. Electronically Signed   By: Roanna Raider M.D.   On: 05/22/2015 01:01   Dg Foot Complete Right  05/10/2015  CLINICAL DATA:  Right foot pain. Possible fall 3-4 days ago. Midfoot bruising changes. EXAM: RIGHT FOOT COMPLETE - 3+ VIEW COMPARISON:  None. FINDINGS: Moderate midfoot degenerative changes with possible erosive findings. No acute fracture is identified. Hallux valgus deformity and degenerative change at the first metatarsal phalangeal joint. IMPRESSION: Midfoot degenerative changes but no acute fracture. Electronically Signed   By: Rudie Meyer M.D.   On: 05/10/2015 09:44   Ir Radiologist Eval & Mgmt  05/28/2015  EXAM: NEW PATIENT OFFICE VISIT CHIEF COMPLAINT: History of intracranial aneurysms. Current Pain Level: 1-10 HISTORY OF PRESENT ILLNESS: The patient is a 75 year old, right-handed lady referred for  evaluation of treatment of intracranial aneurysms, which were seen on a recent MRI/MRA of the brain during her recent admission. The patient herself could not physically be present. However the patient's daughter, Carlyle Basques, was available for consultation. The patient's daughter apparently is aware of the 2 aneurysms in the brain in her mom. The presence of aneurysm apparently dates to her previous MRI/MRAs performed. Apparently there was reportedly a slight increase in the size of the aneurysms. The patient's daughter reports that her mother suffers from Alzheimer's dementia with her mother requiring constant supervision. However she reports this is only gotten recently worse. The patient's daughter appeared to be somewhat disturbed by the fact that these aneurysms have apparently been known to be present with no action having been taken  up until now. The natural history of unruptured intracranial aneurysms was reviewed with the patient's daughter. 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 Risk of rupture of 1-2% per year per aneurysm with attendant risks of severe mortality and morbidity were reviewed. Risk factors of increased risks of rupture and or enlargement or discussed, namely hypertension, smoking. Also discussed were strong family history especially females with intracranial aneurysms ruptured or unruptured. According to the patient's daughter there is no known family history of intracranial aneurysms. Options regarding management were those of continued medical management given the patient's general medical condition versus consideration of occlusion of the aneurysms from the circulations namely by endovascular means. The procedures were discussed with attendant risks and benefits. These procedures will require the patient to be compliant with at least of two medicines for a period of about 3-4  months and also would have to be undertaken under general anesthesia. Also since the aneurysms were in two separate arterial territories, this may require 2 separate interventions under general anesthesia. Risks of the procedure were all reviewed with patient's daughter. Questions were answered to her satisfaction in regards intracranial aneurysm management. It was felt that further discussions with the rest of the family members would be extremely important in regards the direction of future management of these intracranial aneurysms given her overall medical condition. The patient's daughter was going to review this with the rest of the family and come to a final decision. In the meantime she was asked to call should she have any concerns or questions. PHYSICAL EXAMINATION: Problem Focused, System of Complaint ASSESSMENT AND PLAN: Straight-forward Decision Making Electronically Signed   By: Julieanne Cotton M.D.   On: 05/24/2015 16:29    Microbiology: Recent Results (from the past 240 hour(s))  MRSA PCR Screening     Status: None   Collection Time: 06/06/15  1:45 AM  Result Value Ref Range Status   MRSA by PCR NEGATIVE NEGATIVE Final    Comment:        The GeneXpert MRSA Assay (FDA approved for NASAL specimens only), is one component of a comprehensive MRSA colonization surveillance program. It is not intended to diagnose MRSA infection nor to guide or monitor treatment for MRSA infections.      Labs: Basic Metabolic Panel:  Recent Labs Lab 06/05/15 2139 06/06/15 0956 06/06/15 1341  NA 141 145  --   K 4.3 4.1  --   CL 104 110  --   CO2 25 27  --   GLUCOSE 134* 97  --   BUN 17 12  --   CREATININE 0.62 0.64  --   CALCIUM 9.5 9.1  --   MG  --   --  1.8   Liver Function Tests:  Recent Labs Lab 06/06/15 0956  AST 25  ALT 19  ALKPHOS 70  BILITOT 1.2  PROT 5.6*  ALBUMIN 3.1*   No results for input(s): LIPASE, AMYLASE in the last 168 hours. No results for input(s):  AMMONIA in the last 168 hours. CBC:  Recent Labs Lab 06/05/15 2139 06/06/15 0956  WBC 11.4* 6.4  NEUTROABS 9.8* 4.2  HGB 13.2 13.2  HCT 38.8 38.5  MCV 96.0 96.3  PLT 240 226   Cardiac Enzymes:  Recent Labs Lab 06/06/15 0333 06/06/15 0956  TROPONINI <0.03 <0.03   BNP: BNP (last 3 results) No results for input(s): BNP in the last 8760 hours.  ProBNP (last 3 results) No results for input(s): PROBNP in the last 8760 hours.  CBG: No results for input(s): GLUCAP in the last 168 hours.     Signed:  Alba Cory MD.  [facility] 06/08/2015, 10:10 AM

## 2015-06-08 NOTE — Progress Notes (Addendum)
Patient will discharge to [facility] Anticipated discharge date:2/7 Family notified: pt dtr, Andre Lefort by [employer]- scheduled for 1:30pm  CSW signing off.  Merlyn Lot, LCSWA Clinical Social Worker [phone]

## 2015-06-09 ENCOUNTER — Telehealth: Payer: Self-pay | Admitting: Neurology

## 2015-06-09 NOTE — Telephone Encounter (Signed)
Pt's daughter Jerral Bonito called said she doesn't want mother to see Dr Pearlean Brownie anymore and requesting she see Dr Roda Shutters. She said Dr Pearlean Brownie referred pt Dr Corliss Skains about 6 weeks ago. Dr Corliss Skains compared all MRI's Dr Pearlean Brownie ordered since 2010 and said pt has had 2 aneurysm that showed on MRI in 2010. Daughter said Dr Pearlean Brownie never said anything about any type of aneurysm over the years and her mother has been at risk. She said Dr Corliss Skains told her Dr Pearlean Brownie referred pt to him because "aneurysm were growing" and she has never been told pt had any aneurysm. She feels her mother has been at risk for years and Dr Corliss Skains said she and her sister are at risk also. Daughter said she is very disappointed with the treatment her mother did not receive from Dr Pearlean Brownie as a neurologist. Daughter has appt with Dyanne Carrel 06/25/15 to discuss this.

## 2015-06-14 ENCOUNTER — Institutional Professional Consult (permissible substitution): Payer: Self-pay | Admitting: Neurology

## 2015-06-22 NOTE — Telephone Encounter (Signed)
Per Marylu Lund, Dr Marjory Lies said pt could be scheduled with Dr Roda Shutters. On 06/10/15 I left message on [name] Stump VM that pt could see Dr Roda Shutters. She did not return the call. On 06/11/15 I left another VM.

## 2015-07-07 ENCOUNTER — Encounter: Payer: Self-pay | Admitting: Cardiovascular Disease

## 2015-07-07 ENCOUNTER — Ambulatory Visit (INDEPENDENT_AMBULATORY_CARE_PROVIDER_SITE_OTHER): Payer: Medicare Other | Admitting: Cardiovascular Disease

## 2015-07-07 VITALS — BP 120/70 | HR 61 | Ht 60.0 in | Wt 136.1 lb

## 2015-07-07 DIAGNOSIS — I25118 Atherosclerotic heart disease of native coronary artery with other forms of angina pectoris: Secondary | ICD-10-CM | POA: Diagnosis not present

## 2015-07-07 NOTE — Patient Instructions (Signed)
Medication Instructions:  Your physician recommends that you continue on your current medications as directed. Please refer to the Current Medication list given to you today.   Labwork: None Ordered   Testing/Procedures: None Ordered   Follow-Up: Your physician wants you to follow-up in: 1 year with Dr. Nahser.  You will receive a reminder letter in the mail two months in advance. If you don't receive a letter, please call our office to schedule the follow-up appointment.   If you need a refill on your cardiac medications before your next appointment, please call your pharmacy.   Thank you for choosing [facility]! Jizelle Conkey, RN [phone]    

## 2015-07-07 NOTE — Progress Notes (Signed)
[name] Date of Birth  [birth year] [facility]     [place] Office  [address]    [address]   [address] [place], [place]  [address]    [place], [place]  [address] [phone]  Fax  [phone]  [phone]  Fax [phone]  Problems: 1. Coronary artery disease-status post PTCA and stenting of her left anterior descending artery 2. Stroke 3. Hypertension 4. Hyperlipidemia 5. Left leg DVT.   January, 2017 6. Brain aneurisms - conservative therapy   History of Present Illness:  [name] is doing very well. She has a history of coronary artery disease. She's also had a major stroke. Her speech has gradually improved.  She's walking some.  she's not had any episodes of angina.  October 25, 2012:  [name]  Is doing well.  She is staying inside because of the heat.    Nov. 12, 2015:  [name] is doing well. He's not having episodes of chest pain or shortness of breath. She gets some exercise. Her memory is gradually getting slightly worse. She quit smoking several years ago at the same time she had her stroke.   July 07, 2015: Is out at [facility] .    Seen with daughter , [name] . Has generally declined in health. Developed a left leg DVT in Jan. Was hospitalized in Feb. With respiratory distress.   Has not smoked since her stroke    Current Outpatient Prescriptions on File Prior to Visit  Medication Sig Dispense Refill  . apixaban (ELIQUIS) 5 MG TABS tablet Take 1 tablet (5 mg total) by mouth 2 (two) times daily. 60 tablet 0  . bisacodyl (DULCOLAX) 10 MG suppository Place 10 mg rectally daily as needed for moderate constipation.    . clopidogrel (PLAVIX) 75 MG tablet TAKE 1 TABLET BY MOUTH EVERY DAY 15 tablet 0  . LIPITOR 40 MG tablet TAKE 1 TABLET BY MOUTH EVERY DAY 30 tablet 6  . magnesium hydroxide (MILK OF MAGNESIA) 400 MG/5ML suspension Take 30 mLs by mouth daily as needed for mild constipation.    . metoprolol tartrate (LOPRESSOR) 25 MG tablet  Take 0.5 tablets (12.5 mg total) by mouth 2 (two) times daily. 30 tablet 0  . nitroGLYCERIN (NITROSTAT) 0.4 MG SL tablet Place 1 tablet (0.4 mg total) under the tongue every 5 (five) minutes as needed for chest pain. 25 tablet 12   No current facility-administered medications on file prior to visit.    Allergies  Allergen Reactions  . Seroquel [Quetiapine Fumarate] Other (See Comments)    Next day after taking medication, patient was hard to wake up from her sleep    Past Medical History  Diagnosis Date  . Hypertension   . Hyperlipidemia   . Coronary artery disease 2000    STATUS POT PTCA ND STENTING OF THE LAD   . CVA (cerebrovascular accident) (HCC) 2010  . Constipation   . Dementia   . DVT (deep venous thrombosis) (HCC)     LLE  . Syncope 06/2015  . Urine frequency   . Incontinence of urine     Past Surgical History  Procedure Laterality Date  . None    . Coronary angioplasty with stent placement      History  Smoking status  . Former Smoker  . Quit date: 05/01/2008  Smokeless tobacco  . Never Used    History  Alcohol Use No    Family History  Problem Relation Age of Onset  .  Alzheimer's disease Mother     Reviw of Systems:  Reviewed in the HPI.  All other systems are negative.  Physical Exam: There were no vitals taken for this visit. General: Well developed, well nourished, in no acute distress.  Head: Normocephalic, atraumatic, sclera non-icteric, mucus membranes are moist,   Neck: Supple. Carotids are 2 + without bruits. No JVD  Lungs: Clear bilaterally to auscultation.  Heart: regular rate.  normal  S1 S2. No murmurs, gallops or rubs.  Abdomen: Soft, non-tender, non-distended with normal bowel sounds. No hepatomegaly. No rebound/guarding. No masses.  Msk:  Strength and tone are normal  Extremities: No clubbing or cyanosis. No edema.  Distal pedal pulses are 2+ and equal bilaterally.  Neuro: Alert and oriented X 3. Moves all extremities  spontaneously.  Psych:  Responds to questions appropriately with a normal affect.  ECG: Nov. 12, 2015: Sinus brady at 50, NS T wave abn.    Assessment / Plan:   1. Coronary artery disease-status post PTCA and stenting of her left anterior descending artery No symptoms.   2. Stroke 3. Hypertension- BP is well controlled.   4. Hyperlipidemia 5. Left leg DVT.   January, 2017 - on Eliquis .   Will see her in 1 year    Nahser, Deloris PingPhilip J, MD  07/07/2015 9:43 AM    [facility] [address],  [address] [place], [place]  [address] Pager [phone] Phone: [phone](336) [phone]; Fax: [phone](336) [phone]   [facility] Office  [address] [place], [place]  [address] [phone]) [phone]   Fax [phone]

## 2017-11-28 IMAGING — DX DG CHEST 2V
2 series · 2 of 2 positions shown · non-contrast
Comparison: 06/21/2011

CLINICAL DATA: Syncope

EXAM:
CHEST  2 VIEW

[chest lat]
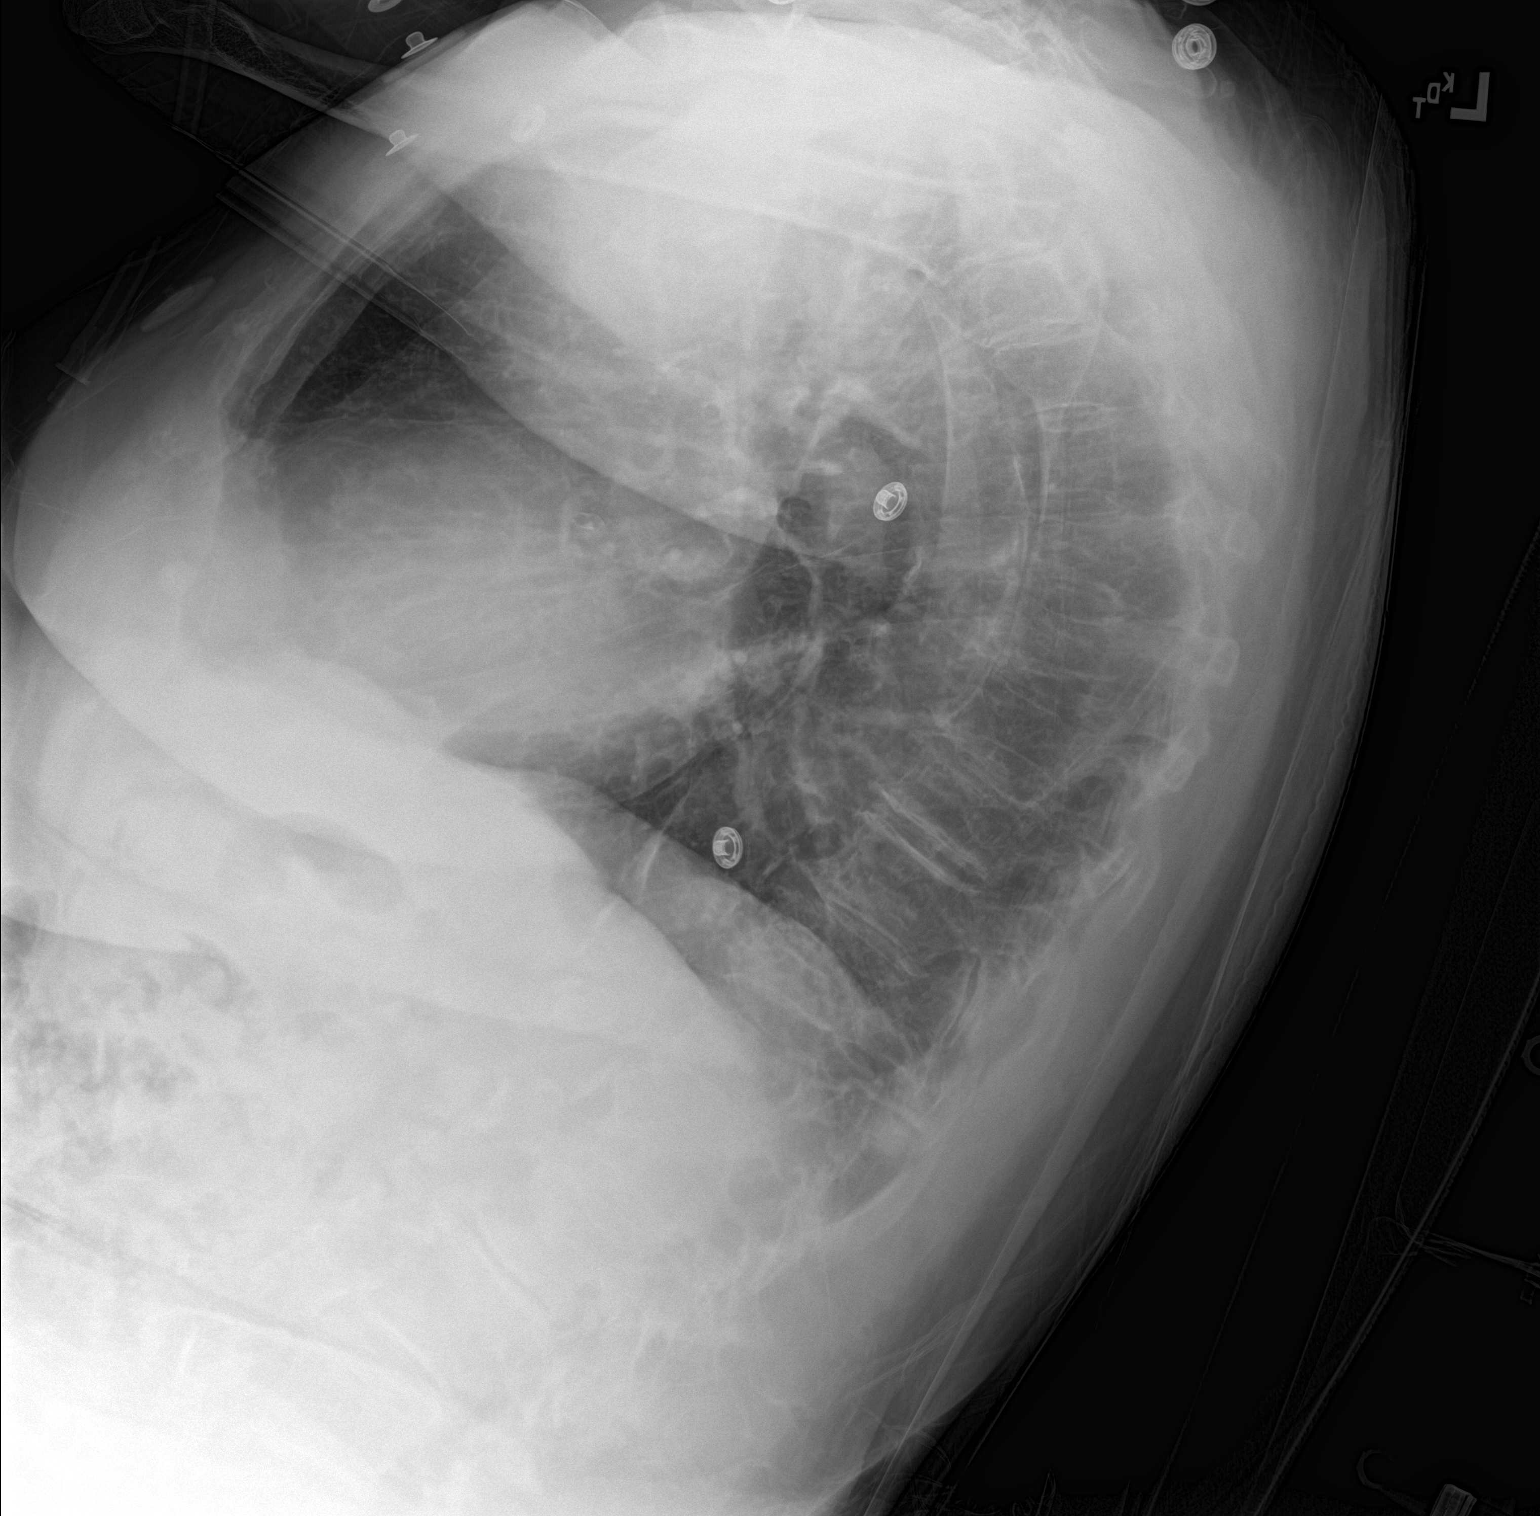

[chest ap]
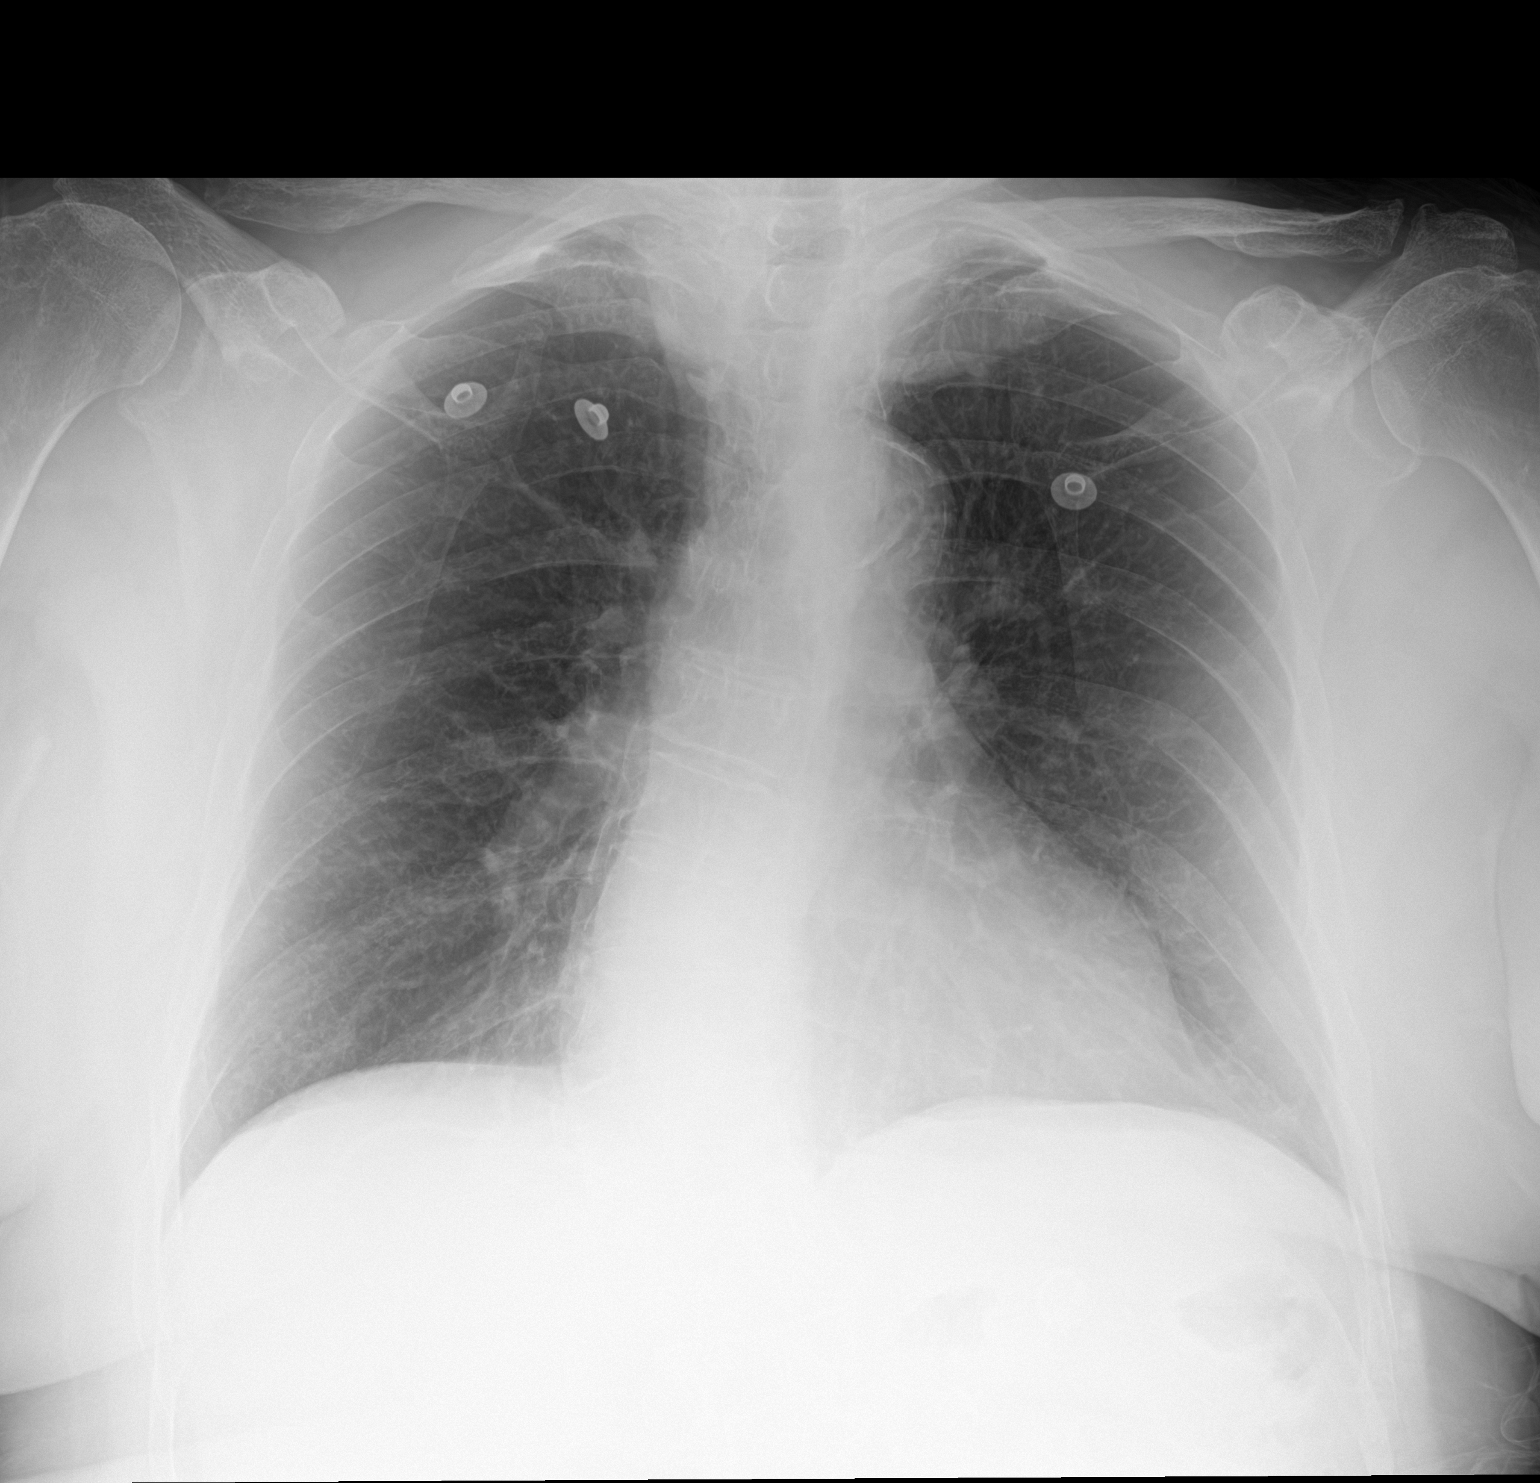

[2 of 2 positions shown; findings below may reference images not displayed]

FINDINGS: The heart size and mediastinal contours are within normal limits.
Both lungs are clear. The visualized skeletal structures are
unremarkable.
IMPRESSION: No active cardiopulmonary disease.

## 2019-05-30 ENCOUNTER — Encounter (HOSPITAL_COMMUNITY): Payer: Self-pay

## 2019-05-30 ENCOUNTER — Emergency Department (HOSPITAL_COMMUNITY): Payer: Medicare Other

## 2019-05-30 ENCOUNTER — Other Ambulatory Visit: Payer: Self-pay

## 2019-05-30 ENCOUNTER — Inpatient Hospital Stay (HOSPITAL_COMMUNITY): Payer: Medicare Other

## 2019-05-30 ENCOUNTER — Inpatient Hospital Stay (HOSPITAL_COMMUNITY)
Admission: EM | Admit: 2019-05-30 | Discharge: 2019-06-30 | DRG: 871 | Disposition: E | Payer: Medicare Other | Attending: Internal Medicine | Admitting: Internal Medicine

## 2019-05-30 DIAGNOSIS — L89154 Pressure ulcer of sacral region, stage 4: Secondary | ICD-10-CM | POA: Diagnosis present

## 2019-05-30 DIAGNOSIS — Z955 Presence of coronary angioplasty implant and graft: Secondary | ICD-10-CM

## 2019-05-30 DIAGNOSIS — G9341 Metabolic encephalopathy: Secondary | ICD-10-CM | POA: Diagnosis present

## 2019-05-30 DIAGNOSIS — E872 Acidosis, unspecified: Secondary | ICD-10-CM

## 2019-05-30 DIAGNOSIS — I69320 Aphasia following cerebral infarction: Secondary | ICD-10-CM | POA: Diagnosis not present

## 2019-05-30 DIAGNOSIS — I10 Essential (primary) hypertension: Secondary | ICD-10-CM | POA: Diagnosis present

## 2019-05-30 DIAGNOSIS — J80 Acute respiratory distress syndrome: Secondary | ICD-10-CM | POA: Diagnosis present

## 2019-05-30 DIAGNOSIS — R7881 Bacteremia: Secondary | ICD-10-CM | POA: Diagnosis not present

## 2019-05-30 DIAGNOSIS — B952 Enterococcus as the cause of diseases classified elsewhere: Secondary | ICD-10-CM

## 2019-05-30 DIAGNOSIS — A4159 Other Gram-negative sepsis: Principal | ICD-10-CM | POA: Diagnosis present

## 2019-05-30 DIAGNOSIS — F05 Delirium due to known physiological condition: Secondary | ICD-10-CM | POA: Diagnosis present

## 2019-05-30 DIAGNOSIS — I2510 Atherosclerotic heart disease of native coronary artery without angina pectoris: Secondary | ICD-10-CM | POA: Diagnosis present

## 2019-05-30 DIAGNOSIS — Z7901 Long term (current) use of anticoagulants: Secondary | ICD-10-CM | POA: Diagnosis not present

## 2019-05-30 DIAGNOSIS — I63312 Cerebral infarction due to thrombosis of left middle cerebral artery: Secondary | ICD-10-CM | POA: Diagnosis present

## 2019-05-30 DIAGNOSIS — A419 Sepsis, unspecified organism: Secondary | ICD-10-CM

## 2019-05-30 DIAGNOSIS — E860 Dehydration: Secondary | ICD-10-CM

## 2019-05-30 DIAGNOSIS — F03918 Unspecified dementia, unspecified severity, with other behavioral disturbance: Secondary | ICD-10-CM | POA: Diagnosis present

## 2019-05-30 DIAGNOSIS — J1282 Pneumonia due to coronavirus disease 2019: Secondary | ICD-10-CM | POA: Diagnosis present

## 2019-05-30 DIAGNOSIS — E785 Hyperlipidemia, unspecified: Secondary | ICD-10-CM | POA: Diagnosis present

## 2019-05-30 DIAGNOSIS — Z515 Encounter for palliative care: Secondary | ICD-10-CM | POA: Diagnosis not present

## 2019-05-30 DIAGNOSIS — J9601 Acute respiratory failure with hypoxia: Secondary | ICD-10-CM | POA: Diagnosis not present

## 2019-05-30 DIAGNOSIS — Z86718 Personal history of other venous thrombosis and embolism: Secondary | ICD-10-CM | POA: Diagnosis not present

## 2019-05-30 DIAGNOSIS — Z87891 Personal history of nicotine dependence: Secondary | ICD-10-CM | POA: Diagnosis not present

## 2019-05-30 DIAGNOSIS — E861 Hypovolemia: Secondary | ICD-10-CM | POA: Diagnosis present

## 2019-05-30 DIAGNOSIS — A409 Streptococcal sepsis, unspecified: Secondary | ICD-10-CM | POA: Diagnosis not present

## 2019-05-30 DIAGNOSIS — F0391 Unspecified dementia with behavioral disturbance: Secondary | ICD-10-CM | POA: Diagnosis present

## 2019-05-30 DIAGNOSIS — E870 Hyperosmolality and hypernatremia: Secondary | ICD-10-CM | POA: Diagnosis present

## 2019-05-30 DIAGNOSIS — Z7401 Bed confinement status: Secondary | ICD-10-CM

## 2019-05-30 DIAGNOSIS — U071 COVID-19: Secondary | ICD-10-CM | POA: Diagnosis present

## 2019-05-30 DIAGNOSIS — R0602 Shortness of breath: Secondary | ICD-10-CM | POA: Diagnosis present

## 2019-05-30 DIAGNOSIS — Z66 Do not resuscitate: Secondary | ICD-10-CM | POA: Diagnosis present

## 2019-05-30 DIAGNOSIS — N179 Acute kidney failure, unspecified: Secondary | ICD-10-CM | POA: Diagnosis present

## 2019-05-30 DIAGNOSIS — L8990 Pressure ulcer of unspecified site, unspecified stage: Secondary | ICD-10-CM | POA: Diagnosis present

## 2019-05-30 DIAGNOSIS — R6521 Severe sepsis with septic shock: Secondary | ICD-10-CM | POA: Diagnosis not present

## 2019-05-30 DIAGNOSIS — I82402 Acute embolism and thrombosis of unspecified deep veins of left lower extremity: Secondary | ICD-10-CM | POA: Diagnosis present

## 2019-05-30 DIAGNOSIS — E876 Hypokalemia: Secondary | ICD-10-CM | POA: Diagnosis present

## 2019-05-30 LAB — COMPREHENSIVE METABOLIC PANEL
ALT: 64 U/L — ABNORMAL HIGH (ref 0–44)
AST: 69 U/L — ABNORMAL HIGH (ref 15–41)
Albumin: 2.9 g/dL — ABNORMAL LOW (ref 3.5–5.0)
Alkaline Phosphatase: 86 U/L (ref 38–126)
Anion gap: 16 — ABNORMAL HIGH (ref 5–15)
BUN: 58 mg/dL — ABNORMAL HIGH (ref 8–23)
CO2: 27 mmol/L (ref 22–32)
Calcium: 9.5 mg/dL (ref 8.9–10.3)
Chloride: 128 mmol/L — ABNORMAL HIGH (ref 98–111)
Creatinine, Ser: 1.65 mg/dL — ABNORMAL HIGH (ref 0.44–1.00)
GFR calc Af Amer: 34 mL/min — ABNORMAL LOW (ref >60)
GFR calc non Af Amer: 29 mL/min — ABNORMAL LOW (ref >60)
Glucose, Bld: 153 mg/dL — ABNORMAL HIGH (ref 70–99)
Potassium: 3.9 mmol/L (ref 3.5–5.1)
Sodium: 171 mmol/L (ref 135–145)
Total Bilirubin: 1 mg/dL (ref 0.3–1.2)
Total Protein: 8.2 g/dL — ABNORMAL HIGH (ref 6.5–8.1)

## 2019-05-30 LAB — CBC WITH DIFFERENTIAL/PLATELET
Abs Immature Granulocytes: 0.26 10*3/uL — ABNORMAL HIGH (ref 0.00–0.07)
Basophils Absolute: 0.1 10*3/uL (ref 0.0–0.1)
Basophils Relative: 0 %
Eosinophils Absolute: 0 10*3/uL (ref 0.0–0.5)
Eosinophils Relative: 0 %
HCT: 52.3 % — ABNORMAL HIGH (ref 36.0–46.0)
Hemoglobin: 15.1 g/dL — ABNORMAL HIGH (ref 12.0–15.0)
Immature Granulocytes: 1 %
Lymphocytes Relative: 11 %
Lymphs Abs: 2.6 10*3/uL (ref 0.7–4.0)
MCH: 28.7 pg (ref 26.0–34.0)
MCHC: 28.9 g/dL — ABNORMAL LOW (ref 30.0–36.0)
MCV: 99.4 fL (ref 80.0–100.0)
Monocytes Absolute: 1.2 10*3/uL — ABNORMAL HIGH (ref 0.1–1.0)
Monocytes Relative: 5 %
Neutro Abs: 19 10*3/uL — ABNORMAL HIGH (ref 1.7–7.7)
Neutrophils Relative %: 83 %
Platelets: 402 10*3/uL — ABNORMAL HIGH (ref 150–400)
RBC: 5.26 MIL/uL — ABNORMAL HIGH (ref 3.87–5.11)
RDW: 14.6 % (ref 11.5–15.5)
WBC: 23.2 10*3/uL — ABNORMAL HIGH (ref 4.0–10.5)
nRBC: 0 % (ref 0.0–0.2)

## 2019-05-30 LAB — C-REACTIVE PROTEIN: CRP: 18.9 mg/dL — ABNORMAL HIGH (ref <1.0)

## 2019-05-30 LAB — LACTIC ACID, PLASMA: Lactic Acid, Venous: 6.3 mmol/L (ref 0.5–1.9)

## 2019-05-30 LAB — FIBRINOGEN: Fibrinogen: >800 mg/dL — ABNORMAL HIGH (ref 210–475)

## 2019-05-30 LAB — TRIGLYCERIDES: Triglycerides: 127 mg/dL (ref <150)

## 2019-05-30 LAB — FERRITIN: Ferritin: 228 ng/mL (ref 11–307)

## 2019-05-30 LAB — LACTATE DEHYDROGENASE: LDH: 303 U/L — ABNORMAL HIGH (ref 98–192)

## 2019-05-30 LAB — D-DIMER, QUANTITATIVE: D-Dimer, Quant: 1.26 ug/mL-FEU — ABNORMAL HIGH (ref 0.00–0.50)

## 2019-05-30 LAB — GLUCOSE, CAPILLARY: Glucose-Capillary: 118 mg/dL — ABNORMAL HIGH (ref 70–99)

## 2019-05-30 LAB — PROCALCITONIN: Procalcitonin: 0.2 ng/mL

## 2019-05-30 MED ORDER — DEXTROSE 5 % IV SOLN: INTRAVENOUS | Status: DC

## 2019-05-30 MED ORDER — INSULIN ASPART 100 UNIT/ML ~~LOC~~ SOLN
0.0000 [IU] | Freq: Three times a day (TID) | SUBCUTANEOUS | Status: DC
  Administered 2019-05-31: 08:00:00 3 [IU] via SUBCUTANEOUS
  Administered 2019-05-31: 1 [IU] via SUBCUTANEOUS
  Administered 2019-06-01: 2 [IU] via SUBCUTANEOUS
  Filled 2019-05-30: qty 0.09

## 2019-05-30 MED ORDER — GUAIFENESIN-DM 100-10 MG/5ML PO SYRP: 10.0000 mL | ORAL_SOLUTION | ORAL | Status: DC | PRN

## 2019-05-30 MED ORDER — APIXABAN 5 MG PO TABS
5.0000 mg | ORAL_TABLET | Freq: Two times a day (BID) | ORAL | Status: DC
  Administered 2019-05-31: 5 mg via ORAL
  Filled 2019-05-30: qty 1

## 2019-05-30 MED ORDER — FREE WATER: 400.0000 mL | Freq: Three times a day (TID) | Status: DC

## 2019-05-30 MED ORDER — ATORVASTATIN CALCIUM 40 MG PO TABS: 40.0000 mg | ORAL_TABLET | Freq: Every day | ORAL | Status: DC

## 2019-05-30 MED ORDER — LORAZEPAM 2 MG/ML IJ SOLN
0.5000 mg | Freq: Three times a day (TID) | INTRAMUSCULAR | Status: DC | PRN
  Administered 2019-05-31 – 2019-06-01 (×2): 0.5 mg via INTRAVENOUS
  Filled 2019-05-30 (×3): qty 1

## 2019-05-30 MED ORDER — METHYLPREDNISOLONE SODIUM SUCC 40 MG IJ SOLR
40.0000 mg | Freq: Two times a day (BID) | INTRAMUSCULAR | Status: DC
  Administered 2019-05-30 – 2019-06-01 (×5): 40 mg via INTRAVENOUS
  Filled 2019-05-30 (×5): qty 1

## 2019-05-30 MED ORDER — ZINC SULFATE 220 (50 ZN) MG PO CAPS: 220.0000 mg | ORAL_CAPSULE | Freq: Every day | ORAL | Status: DC

## 2019-05-30 MED ORDER — SODIUM CHLORIDE 0.9 % IV BOLUS
1000.0000 mL | Freq: Once | INTRAVENOUS | Status: AC
  Administered 2019-05-30: 1000 mL via INTRAVENOUS

## 2019-05-30 MED ORDER — APIXABAN 5 MG PO TABS: 5.0000 mg | ORAL_TABLET | Freq: Two times a day (BID) | ORAL | Status: DC

## 2019-05-30 MED ORDER — SODIUM CHLORIDE 0.9 % IV SOLN
200.0000 mg | Freq: Once | INTRAVENOUS | Status: AC
  Administered 2019-05-30: 200 mg via INTRAVENOUS
  Filled 2019-05-30 (×2): qty 40

## 2019-05-30 MED ORDER — SODIUM CHLORIDE 0.9 % IV SOLN
100.0000 mg | Freq: Every day | INTRAVENOUS | Status: DC
  Administered 2019-05-31 – 2019-06-01 (×2): 100 mg via INTRAVENOUS
  Filled 2019-05-30 (×2): qty 20

## 2019-05-30 MED ORDER — ASCORBIC ACID 500 MG PO TABS: 500.0000 mg | ORAL_TABLET | Freq: Two times a day (BID) | ORAL | Status: DC

## 2019-05-30 MED ORDER — BOOST / RESOURCE BREEZE PO LIQD CUSTOM
1.0000 | Freq: Three times a day (TID) | ORAL | Status: DC
  Filled 2019-05-30 (×8): qty 1

## 2019-05-30 MED ORDER — SODIUM CHLORIDE 0.9 % IV SOLN: INTRAVENOUS | Status: DC

## 2019-05-30 MED ORDER — ASCORBIC ACID 500 MG PO TABS
500.0000 mg | ORAL_TABLET | Freq: Two times a day (BID) | ORAL | Status: DC
  Administered 2019-05-31: 500 mg via ORAL
  Filled 2019-05-30: qty 1

## 2019-05-30 MED ORDER — DEXAMETHASONE SODIUM PHOSPHATE 10 MG/ML IJ SOLN: 6.0000 mg | Freq: Every day | INTRAMUSCULAR | Status: DC

## 2019-05-30 NOTE — ED Notes (Signed)
[facility] bedside.  

## 2019-05-30 NOTE — Progress Notes (Addendum)
Patient arrived to room 140 via EMS. SPO2 100% with 15 LPM HFNC and decreased to 5LPM Leon with SPO2 97-100%. Patient has stage 3 PU to coccyx/sacral area. Covered with bordered foam until MD can evaluate. Odor and drainage noted. Patient confused and does not follow commands and thrashes when moved or VS attempted to be gotten by staff. Notified MD of arrival to floor. Report given to shift RN Zollie Scale. BLE Ruddy. No other skin issues noted.

## 2019-05-30 NOTE — ED Notes (Signed)
[facility] called. 

## 2019-05-30 NOTE — Progress Notes (Signed)
Dr. Antionette Char paged and currently waiting on call back/orders. Patient has several PO meds ordered. However, she does not open her mouth to obtain food/water. She needs to have her medications changed to a different route in order for her to receive them. She has a hx of dementia and does not converse/follow commands. Soft mittens applied to patients hands due to pulling at IV.

## 2019-05-30 NOTE — Plan of Care (Signed)
Plan of care 

## 2019-05-30 NOTE — ED Provider Notes (Signed)
[name] Provider Note   CSN: [id number] Arrival date & time: 06/03/19  1140     History Chief Complaint  Patient presents with  . COVID +    [name] is a 79 y.o. female. Level 5 caveat secondary to dementia HPI  83 female presents from nursing home with reports of hypoxia and Covid infection.  Patient is demented and unable to give any history.  All history is obtained from EMS and records from nursing home.Covid test on January 17.  EMS reported that she was 66% saturated at the nursing home.  Nursing home attempted nasal cannula up to 10 L were unable to get her sats up above the 80s.  Patient was placed on nonrebreather by EMS and sats are currently 100%.   Discussed with daughter Carlyle Basques.  Patient is full code at this time, but she is discussing code status with siblings.  Past Medical History:  Diagnosis Date  . Constipation   . Coronary artery disease 2000   STATUS POT PTCA ND STENTING OF THE LAD   . CVA (cerebrovascular accident) (HCC) 2010  . Dementia (HCC)   . DVT (deep venous thrombosis) (HCC)    LLE  . Hyperlipidemia   . Hypertension   . Incontinence of urine   . Syncope 06/2015  . Urine frequency     Patient Active Problem List   Diagnosis Date Noted  . Chest pressure 06/06/2015  . Syncope 06/05/2015  . 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 DVT (deep venous thrombosis), left 05/22/2015  . Left leg DVT (HCC) 05/22/2015  . Bradycardia 05/16/2015  . UTI (urinary tract infection) 05/11/2015  . 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 Stroke with cerebral ischemia (HCC)   . Weakness 05/08/2015  . TIA (transient ischemic attack) 05/08/2015  . Dementia with behavioral disturbance (HCC) 11/20/2013  . Agitation 11/20/2013  . Hypertension   . Hyperlipidemia   . 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 Coronary artery disease     Past Surgical History:  Procedure  Laterality Date  . CORONARY ANGIOPLASTY WITH STENT PLACEMENT    . none       OB History   No obstetric history on file.     Family History  Problem Relation Age of Onset  . Alzheimer's disease Mother     Social History   Tobacco Use  . Smoking status: Former Smoker    Quit date: 05/01/2008    Years since quitting: 11.0  . Smokeless tobacco: Never Used  Substance Use Topics  . Alcohol use: No    Alcohol/week: 0.0 standard drinks  . Drug use: No    Home Medications Prior to Admission medications   Medication Sig Start Date End Date Taking? Authorizing Provider  acetaminophen (TYLENOL) 500 MG tablet Take 500 mg by mouth every 6 (six) hours as needed for mild pain or moderate pain.    [provider]  apixaban (ELIQUIS) 5 MG TABS tablet Take 1 tablet (5 mg total) by mouth 2 (two) times daily. 05/29/15   Catarina Hartshorn, MD  bisacodyl (DULCOLAX) 10 MG suppository Place 10 mg rectally daily as needed for moderate constipation.    [provider]  LIPITOR 40 MG tablet TAKE 1 TABLET BY MOUTH EVERY DAY 05/10/15   Nahser, Deloris Ping, MD  magnesium hydroxide (MILK OF MAGNESIA) 400 MG/5ML suspension Take 30 mLs by mouth daily as needed for mild constipation.  [provider]  metoprolol tartrate (LOPRESSOR) 25 MG tablet Take 0.5 tablets (12.5 mg total) by mouth 2 (two) times daily. 06/08/15   Regalado, Belkys A, MD  nitroGLYCERIN (NITROSTAT) 0.4 MG SL tablet Place 1 tablet (0.4 mg total) under the tongue every 5 (five) minutes as needed for chest pain. 03/12/14   Nahser, Deloris Ping, MD    Allergies    Seroquel [quetiapine fumarate]  Review of Systems   Review of Systems  Unable to perform ROS: Dementia    Physical Exam Updated Vital Signs BP (!) 151/104 (BP Location: Right Arm)   Pulse 99   Temp 99.2 F (37.3 C) (Rectal)   Resp (!) 23   SpO2 100%   Physical Exam Vitals and nursing note reviewed.  Constitutional:      General: She is not in acute  distress.    Comments: Patient appears chronically ill  HENT:     Head: Normocephalic.     Right Ear: External ear normal.     Left Ear: External ear normal.     Nose: Nose normal.     Mouth/Throat:     Mouth: Mucous membranes are moist.  Eyes:     Extraocular Movements: Extraocular movements intact.     Pupils: Pupils are equal, round, and reactive to light.  Cardiovascular:     Rate and Rhythm: Normal rate and regular rhythm.     Pulses: Normal pulses.  Pulmonary:     Effort: Pulmonary effort is normal.     Breath sounds: Normal breath sounds.  Abdominal:     General: Abdomen is flat.     Palpations: Abdomen is soft.  Musculoskeletal:     Cervical back: Normal range of motion.     Comments: Patient's lower extremities are stiff and she refuses to allow flexion or movement of knee or hip on either side Sacral decubitus ulcer noted  Skin:    General: Skin is warm.     Comments: Skin is mottled over bilateral knees and feet  Neurological:     Mental Status: She is alert.     Comments: Patient is mumbling with eyes open but she is not forming comprehensible words     ED Results / Procedures / Treatments   Labs (all labs ordered are listed, but only abnormal results are displayed) Labs Reviewed - No data to display  EKG EKG Interpretation  Date/Time:  Friday 06/15/19 12:06:40 EST Ventricular Rate:  99 PR Interval:    QRS Duration: 72 QT Interval:  421 QTC Calculation: 541 R Axis:   -38 Text Interpretation: Normal sinus rhythm Poor R wave progression Prolonged QT SINCE LAST TRACING HEART RATE HAS INCREASED Confirmed by Margarita Grizzle [phone]) on 15-Jun-2019 1:32:22 PM   Radiology DG Chest Port 1 View  Result Date: 06/15/2019 CLINICAL DATA:  COVID positive, hypoxia EXAM: PORTABLE CHEST 1 VIEW COMPARISON:  06/15/2015 FINDINGS: There is no focal consolidation. There is no pleural effusion or pneumothorax. The heart and mediastinal contours are unremarkable. There is  thoracic aortic atherosclerosis. There is no acute osseous abnormality. IMPRESSION: No active disease. Electronically Signed   By: Elige Ko   On: 2019/06/15 13:10    Procedures Procedures (including critical care time)  Medications Ordered in ED Medications - No data to display  ED Course  I have reviewed the triage vital signs and the nursing notes.  Pertinent labs & imaging results that were available during my care of the patient were reviewed by me  and considered in my medical decision making (see chart for details). 1- covid infection 2-hypernatremia- iv fluids infusing, suspect significant volume depletion 3-aki- likely secondary to #1 4- mild transaminitis 5- leukocytosis- no obvious source of bacterial etiology at this time, although urine pending, will hold abx for now.     MDM Rules/Calculators/A&P                      Discussed with Dr. Tawanna Solo and will see for admission Final Clinical Impression(s) / ED Diagnoses Final diagnoses:  COVID-19  Hypernatremia    Rx / DC Orders ED Discharge Orders    None       Pattricia Boss, MD 05/31/2019 1525

## 2019-05-30 NOTE — ED Notes (Signed)
ED TO INPATIENT HANDOFF REPORT  ED Nurse Name and Phone #:   S Name/Age/Gender [name] 79 y.o. female Room/Bed: WA17/WA17  Code Status   Code Status: DNR  Home/SNF/Other Nursing Home    Triage Complete: Triage complete  Chief Complaint COVID-19 [U07.1]  Triage Note COVID + since 1/15 Did rapid today and tested positive again Via EMS from [facility]  Alzheimer's, dementia patient  FULL CODE Likes to grab at things Hasn't eaten or drank in 16+ hours Has not taken any medications  Nurse from facility wanted patient sent over  From EMS:  110/74 91 HR 16 R 66% RA 84% 8L [place] 100% 12L NRB     Allergies Allergies  Allergen Reactions  . Seroquel [Quetiapine Fumarate] Other (See Comments)    Next day after taking medication, patient was hard to wake up from her sleep    Level of Care/Admitting Diagnosis ED Disposition    ED Disposition Condition Comment   Admit  Hospital [facility] [[id number]]  Level of Care: Telemetry [5]  Covid Evaluation: Confirmed COVID Positive  Diagnosis: COVID-19 [[id number]]  Admitting Physician: Burnadette Pop [[id number]]  Attending Physician: Burnadette Pop [[id number]]  Estimated length of stay: past midnight tomorrow  Certification:: I certify this patient will need inpatient services for at least 2 midnights       B Medical/Surgery History Past Medical History:  Diagnosis Date  . Constipation   . Coronary artery disease 2000   STATUS POT PTCA ND STENTING OF THE LAD   . CVA (cerebrovascular accident) (HCC) 2010  . Dementia (HCC)   . DVT (deep venous thrombosis) (HCC)    LLE  . Hyperlipidemia   . Hypertension   . Incontinence of urine   . Syncope 06/2015  . Urine frequency    Past Surgical History:  Procedure Laterality Date  . CORONARY ANGIOPLASTY WITH STENT PLACEMENT    . none       A IV Location/Drains/Wounds Patient Lines/Drains/Airways Status   Active Line/Drains/Airways     Name:   Placement date:   Placement time:   Site:   Days:   Peripheral IV 06/14/19 Left Antecubital   2019-06-14    1233    Antecubital   less than 1          Intake/Output Last 24 hours No intake or output data in the 24 hours ending 06-14-19 1736  Labs/Imaging Results for orders placed or performed during the hospital encounter of 06/14/2019 (from the past 48 hour(s))  Lactic acid, plasma     Status: Abnormal   Collection Time: 06/14/2019 12:43 PM  Result Value Ref Range   Lactic Acid, Venous 6.3 (HH) 0.5 - 1.9 mmol/L    Comment: CRITICAL RESULT CALLED TO, READ BACK BY AND VERIFIED WITH: LEDWELL,A. RN AT 1345 2019/06/14 MULLINS,T Performed at [facility], [address], [place], [place] [address]   CBC WITH DIFFERENTIAL     Status: Abnormal   Collection Time: 06-14-2019 12:43 PM  Result Value Ref Range   WBC 23.2 (H) 4.0 - 10.5 K/uL   RBC 5.26 (H) 3.87 - 5.11 MIL/uL   Hemoglobin 15.1 (H) 12.0 - 15.0 g/dL   HCT 26.7 (H) 12.4 - 58.0 %   MCV 99.4 80.0 - 100.0 fL   MCH 28.7 26.0 - 34.0 pg   MCHC 28.9 (L) 30.0 - 36.0 g/dL   RDW 99.8 33.8 - 25.0 %   Platelets 402 (H) 150 - 400 K/uL  nRBC 0.0 0.0 - 0.2 %   Neutrophils Relative % 83 %   Neutro Abs 19.0 (H) 1.7 - 7.7 K/uL   Lymphocytes Relative 11 %   Lymphs Abs 2.6 0.7 - 4.0 K/uL   Monocytes Relative 5 %   Monocytes Absolute 1.2 (H) 0.1 - 1.0 K/uL   Eosinophils Relative 0 %   Eosinophils Absolute 0.0 0.0 - 0.5 K/uL   Basophils Relative 0 %   Basophils Absolute 0.1 0.0 - 0.1 K/uL   Immature Granulocytes 1 %   Abs Immature Granulocytes 0.26 (H) 0.00 - 0.07 K/uL    Comment: Performed at [facility], [address], [place], [place] [address]  Comprehensive metabolic panel     Status: Abnormal   Collection Time: 2019-06-01 12:43 PM  Result Value Ref Range   Sodium 171 (HH) 135 - 145 mmol/L    Comment: REPEATED TO VERIFY CRITICAL RESULT CALLED TO, READ BACK BY AND VERIFIED WITH: LEDWELL,A. RN AT  1345 06/01/19 MULLINS,T    Potassium 3.9 3.5 - 5.1 mmol/L   Chloride 128 (H) 98 - 111 mmol/L    Comment: REPEATED TO VERIFY   CO2 27 22 - 32 mmol/L    Comment: REPEATED TO VERIFY   Glucose, Bld 153 (H) 70 - 99 mg/dL   BUN 58 (H) 8 - 23 mg/dL   Creatinine, Ser 7.82 (H) 0.44 - 1.00 mg/dL   Calcium 9.5 8.9 - 42.3 mg/dL   Total Protein 8.2 (H) 6.5 - 8.1 g/dL   Albumin 2.9 (L) 3.5 - 5.0 g/dL   AST 69 (H) 15 - 41 U/L   ALT 64 (H) 0 - 44 U/L   Alkaline Phosphatase 86 38 - 126 U/L   Total Bilirubin 1.0 0.3 - 1.2 mg/dL   GFR calc non Af Amer 29 (L) >60 mL/min   GFR calc Af Amer 34 (L) >60 mL/min   Anion gap 16 (H) 5 - 15    Comment: Performed at [facility], [address], [place], [place] [address]  D-dimer, quantitative     Status: Abnormal   Collection Time: 01-Jun-2019 12:43 PM  Result Value Ref Range   D-Dimer, Quant 1.26 (H) 0.00 - 0.50 ug/mL-FEU    Comment: (NOTE) At the manufacturer cut-off of 0.50 ug/mL FEU, this assay has been documented to exclude PE with a sensitivity and negative predictive value of 97 to 99%.  At this time, this assay has not been approved by the FDA to exclude DVT/VTE. Results should be correlated with clinical presentation. Performed at [facility], [address] [place], [place] [address]   Procalcitonin     Status: None   Collection Time: 01-Jun-2019 12:43 PM  Result Value Ref Range   Procalcitonin 0.20 ng/mL    Comment:        Interpretation: PCT (Procalcitonin) <= 0.5 ng/mL: Systemic infection (sepsis) is not likely. Local bacterial infection is possible. (NOTE)       Sepsis PCT Algorithm           Lower Respiratory Tract                                      Infection PCT Algorithm    ----------------------------     ----------------------------         PCT < 0.25 ng/mL  PCT < 0.10 ng/mL         Strongly encourage             Strongly discourage   discontinuation of antibiotics     initiation of antibiotics    ----------------------------     -----------------------------       PCT 0.25 - 0.50 ng/mL            PCT 0.10 - 0.25 ng/mL               OR       >80% decrease in PCT            Discourage initiation of                                            antibiotics      Encourage discontinuation           of antibiotics    ----------------------------     -----------------------------         PCT >= 0.50 ng/mL              PCT 0.26 - 0.50 ng/mL               AND        <80% decrease in PCT             Encourage initiation of                                             antibiotics       Encourage continuation           of antibiotics    ----------------------------     -----------------------------        PCT >= 0.50 ng/mL                  PCT > 0.50 ng/mL               AND         increase in PCT                  Strongly encourage                                      initiation of antibiotics    Strongly encourage escalation           of antibiotics                                     -----------------------------                                           PCT <= 0.25 ng/mL                                                 OR                                        >  80% decrease in PCT                                     Discontinue / Do not initiate                                             antibiotics Performed at [facility], [address] [place], [place] [address]   Lactate dehydrogenase     Status: Abnormal   Collection Time: 05/18/2019 12:43 PM  Result Value Ref Range   LDH 303 (H) 98 - 192 U/L    Comment: Performed at [facility], [address], [place], [place] [address]  Ferritin     Status: None   Collection Time: 05/27/2019 12:43 PM  Result Value Ref Range   Ferritin 228 11 - 307 ng/mL    Comment: Performed at [facility], [address], [place], [place] [address]  Triglycerides      Status: None   Collection Time: 05/14/2019 12:43 PM  Result Value Ref Range   Triglycerides 127 <150 mg/dL    Comment: Performed at [facility], [address], [place], [place] [address]  Fibrinogen     Status: Abnormal   Collection Time: 05/04/2019 12:43 PM  Result Value Ref Range   Fibrinogen >800 (H) 210 - 475 mg/dL    Comment: Performed at [facility], [address] [place], [place] [address]  C-reactive protein     Status: Abnormal   Collection Time: 05/09/2019 12:43 PM  Result Value Ref Range   CRP 18.9 (H) <1.0 mg/dL    Comment: Performed at [facility], [address], [place], [place] [address]   DG Chest Port 1 View  Result Date: 05/04/2019 CLINICAL DATA:  COVID positive, hypoxia EXAM: PORTABLE CHEST 1 VIEW COMPARISON:  06/15/2015 FINDINGS: There is no focal consolidation. There is no pleural effusion or pneumothorax. The heart and mediastinal contours are unremarkable. There is thoracic aortic atherosclerosis. There is no acute osseous abnormality. IMPRESSION: No active disease. Electronically Signed   By: Elige Ko   On: 05/26/2019 13:10    Pending Labs Unresulted Labs (From admission, onward)    Start     Ordered   05/31/19 0500  CBC with Differential/Platelet  Daily,   R     05/10/2019 1703   05/31/19 0500  Comprehensive metabolic panel  Daily,   R     05/29/2019 1703   05/31/19 0500  C-reactive protein  Daily,   R     05/06/2019 1703   05/31/19 0500  D-dimer, quantitative (not at [facility])  Daily,   R     05/12/2019 1703   05/31/19 0500  Ferritin  Daily,   R     05/15/2019 1703   05/10/2019 1702  ABO/Rh  Once,   STAT     05/06/2019 1703   05/21/2019 1652  Basic metabolic panel  Now then every 4 hours,   R (with STAT occurrences)     05/15/2019 1652   05/06/2019 1539  Urinalysis, Routine w reflex microscopic  Once,   STAT     05/18/2019 1538   05/25/2019 1219  Lactic acid, plasma  Now then every 2 hours,   STAT  06-15-2019 1218    06/15/19 1219  Blood Culture (routine x 2)  BLOOD CULTURE X 2,   STAT     06-15-2019 1218   Jun 15, 2019 1217  Urinalysis, Routine w reflex microscopic  Once,   STAT     2019-06-15 1218          Vitals/Pain Today's Vitals   06-15-2019 1430 2019-06-15 1500 06-15-2019 1630 Jun 15, 2019 1730  BP: (!) 102/41 (!) 104/57 110/85 115/69  Pulse: 97 90 82 83  Resp: 20 (!) 22 (!) 22   Temp:      TempSrc:      SpO2: 100% 97% 99% 100%    Isolation Precautions Airborne and Contact precautions  Medications Medications  dextrose 5 % solution (has no administration in time range)  insulin aspart (novoLOG) injection 0-9 Units (has no administration in time range)  free water 400 mL (has no administration in time range)  zinc sulfate capsule 220 mg (has no administration in time range)  ascorbic acid (VITAMIN C) tablet 500 mg (has no administration in time range)  methylPREDNISolone sodium succinate (SOLU-MEDROL) 40 mg/mL injection 40 mg (has no administration in time range)  atorvastatin (LIPITOR) tablet 40 mg (has no administration in time range)  apixaban (ELIQUIS) tablet 5 mg (has no administration in time range)  guaiFENesin-dextromethorphan (ROBITUSSIN DM) 100-10 MG/5ML syrup 10 mL (has no administration in time range)  sodium chloride 0.9 % bolus 1,000 mL (0 mLs Intravenous Stopped 2019/06/15 1554)    Mobility non-ambulatory High fall risk      R Recommendations: See Admitting Provider Note  Report given to:   Additional Notes:

## 2019-05-30 NOTE — ED Triage Notes (Addendum)
COVID + since 1/15 Did rapid today and tested positive again Via EMS from [facility]  Alzheimer's, dementia patient  FULL CODE Likes to grab at things Hasn't eaten or drank in 16+ hours Has not taken any medications  Nurse from facility wanted patient sent over  From EMS:  110/74 91 HR 16 R 66% RA 84% 8L [place] 100% 12L NRB

## 2019-05-30 NOTE — Progress Notes (Addendum)
Patient daughter [name] updated on patients status. Patient daughter stated that she did not know about her mothers pressure ulcer today until she was notified by the emergency department and she will be filing a claim with medicare. Also questionnaire for admissions answered by patients daughter.

## 2019-05-30 NOTE — H&P (Addendum)
History and Physical    [name] IOE:[id number] DOB: 1940-12-28 DOA: 05/06/2019  PCP: Donald Prose, MD   Patient coming from: [facility] nursing facility    Chief Complaint: Hypoxia  HPI: [name] is a 79 y.o. female with medical history significant of ischemic stroke with residual aphasia, bedbound status, advanced dementia hypertension, DVT who was sent from [place] facility after she became hypoxic.  Patient was diagnosed with Covid on January 12.  At that time she did not have any symptoms of Covid with no fever or shortness of breath.  Yesterday patient was suddenly found to be hypoxic.  Saturating 60% on room air.  They try to put her on nasal cannula but her saturation did not improve and she was sent to the emergency department.  She was also not eating or drinking since last 2 to 3 days and looked significantly weak. Patient is nonverbal so all the history was taken from daughter from phone. Patient seen and examined at the bedside in the emergency department.  Currently she is on 12 L of oxygen per minute.  She was not in acute respiratory distress.  Afebrile at present.  ED Course: Chest x-ray done in the emergency department did not show any pneumonia.  Remain hypoxic and requiring 12 L of oxygen.  Elevated inflammatory markers.  Found to have sodium of 171, lactic acid in the range of 6, has acute kidney injury, elevated white cell counts.  Procalcitonin of 0.2. After discussion with the daughter, she wants to change the CODE STATUS to DNR.  Daughter wishes treatment for Covid but is not interested in escalation of treatment.  Review of Systems: As per HPI otherwise 10 point review of systems negative.    Past Medical History:  Diagnosis Date  . Constipation   . Coronary artery disease 2000   STATUS POT PTCA ND STENTING OF THE LAD   . CVA (cerebrovascular accident) ([place]) 2010  . Dementia ([place])   . DVT (deep venous thrombosis) ([facility])    LLE  .  Hyperlipidemia   . Hypertension   . Incontinence of urine   . Syncope 06/2015  . Urine frequency     Past Surgical History:  Procedure Laterality Date  . CORONARY ANGIOPLASTY WITH STENT PLACEMENT    . none       reports that she quit smoking about 11 years ago. She has never used smokeless tobacco. She reports that she does not drink alcohol or use drugs.  Allergies  Allergen Reactions  . Seroquel [Quetiapine Fumarate] Other (See Comments)    Next day after taking medication, patient was hard to wake up from her sleep    Family History  Problem Relation Age of Onset  . Alzheimer's disease Mother      Prior to Admission medications   Medication Sig Start Date End Date Taking? Authorizing Provider  acetaminophen (TYLENOL) 500 MG tablet Take 500 mg by mouth every 6 (six) hours as needed for mild pain or moderate pain.    [provider]  apixaban (ELIQUIS) 5 MG TABS tablet Take 1 tablet (5 mg total) by mouth 2 (two) times daily. 05/29/15   Orson Eva, MD  bisacodyl (DULCOLAX) 10 MG suppository Place 10 mg rectally daily as needed for moderate constipation.    [provider]  LIPITOR 40 MG tablet TAKE 1 TABLET BY MOUTH EVERY DAY 05/10/15   Nahser, Wonda Cheng, MD  magnesium hydroxide (MILK OF MAGNESIA) 400 MG/5ML suspension Take 30  mLs by mouth daily as needed for mild constipation.    [provider]  metoprolol tartrate (LOPRESSOR) 25 MG tablet Take 0.5 tablets (12.5 mg total) by mouth 2 (two) times daily. 06/08/15   Regalado, Belkys A, MD  nitroGLYCERIN (NITROSTAT) 0.4 MG SL tablet Place 1 tablet (0.4 mg total) under the tongue every 5 (five) minutes as needed for chest pain. 03/12/14   Nahser, Deloris Ping, MD    Physical Exam: Vitals:   06-27-19 1430 06/27/19 1500 06-27-2019 1630 06/27/19 1730  BP: (!) 102/41 (!) 104/57 110/85 115/69  Pulse: 97 90 82 83  Resp: 20 (!) 22 (!) 22   Temp:      TempSrc:      SpO2: 100% 97% 99% 100%    Constitutional:  Extremely deconditioned, debilitated, chronically looking Vitals:   27-Jun-2019 1430 06-27-2019 1500 06/27/19 1630 06/27/2019 1730  BP: (!) 102/41 (!) 104/57 110/85 115/69  Pulse: 97 90 82 83  Resp: 20 (!) 22 (!) 22   Temp:      TempSrc:      SpO2: 100% 97% 99% 100%   ENMT: Mucous membranes are dry  Neck: normal, supple, no masses Respiratory: clear to auscultation bilaterally, no wheezing, no crackles. Normal respiratory effort. No accessory muscle use.  Cardiovascular: Regular rate and rhythm, no murmurs / rubs / gallops. No extremity edema.  Abdomen: no tenderness, no masses palpated. No hepatosplenomegaly. Bowel sounds positive.  Musculoskeletal: no clubbing / cyanosis. Skin: Scattered ecchymosis, no frank ulcers or lesions Neurologic: Neurological examination was not possible.  Patient is not alert or oriented.  Does not obey commands   Foley Catheter:None  Labs on Admission: I have personally reviewed following labs and imaging studies  CBC: Recent Labs  Lab Jun 27, 2019 1243  WBC 23.2*  NEUTROABS 19.0*  HGB 15.1*  HCT 52.3*  MCV 99.4  PLT 402*   Basic Metabolic Panel: Recent Labs  Lab 2019/06/27 1243  NA 171*  K 3.9  CL 128*  CO2 27  GLUCOSE 153*  BUN 58*  CREATININE 1.65*  CALCIUM 9.5   GFR: CrCl cannot be calculated (Unknown ideal weight.). Liver Function Tests: Recent Labs  Lab 06/27/2019 1243  AST 69*  ALT 64*  ALKPHOS 86  BILITOT 1.0  PROT 8.2*  ALBUMIN 2.9*   No results for input(s): LIPASE, AMYLASE in the last 168 hours. No results for input(s): AMMONIA in the last 168 hours. Coagulation Profile: No results for input(s): INR, PROTIME in the last 168 hours. Cardiac Enzymes: No results for input(s): CKTOTAL, CKMB, CKMBINDEX, TROPONINI in the last 168 hours. BNP (last 3 results) No results for input(s): PROBNP in the last 8760 hours. HbA1C: No results for input(s): HGBA1C in the last 72 hours. CBG: No results for input(s): GLUCAP in the last 168  hours. Lipid Profile: Recent Labs    06/27/19 1243  TRIG 127   Thyroid Function Tests: No results for input(s): TSH, T4TOTAL, FREET4, T3FREE, THYROIDAB in the last 72 hours. Anemia Panel: Recent Labs    27-Jun-2019 1243  FERRITIN 228   Urine analysis:    Component Value Date/Time   COLORURINE YELLOW 06/05/2015 2305   APPEARANCEUR CLOUDY (A) 06/05/2015 2305   LABSPEC 1.024 06/05/2015 2305   PHURINE 5.0 06/05/2015 2305   GLUCOSEU NEGATIVE 06/05/2015 2305   HGBUR SMALL (A) 06/05/2015 2305   BILIRUBINUR NEGATIVE 06/05/2015 2305   KETONESUR NEGATIVE 06/05/2015 2305   PROTEINUR NEGATIVE 06/05/2015 2305   UROBILINOGEN 0.2 11/04/2011 1102   NITRITE NEGATIVE  06/05/2015 2305   LEUKOCYTESUR NEGATIVE 06/05/2015 2305    Radiological Exams on Admission: DG Chest Port 1 View  Result Date: Jun 09, 2019 CLINICAL DATA:  COVID positive, hypoxia EXAM: PORTABLE CHEST 1 VIEW COMPARISON:  06/15/2015 FINDINGS: There is no focal consolidation. There is no pleural effusion or pneumothorax. The heart and mediastinal contours are unremarkable. There is thoracic aortic atherosclerosis. There is no acute osseous abnormality. IMPRESSION: No active disease. 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  Was doing well but suddenly became hypoxic yesterday.  Chest x-ray does not show pneumonia.  Checking CT chest without contrast.  Could not do with contrast due to AKI.  Low chance of PE.  She is already on Eliquis. Currently on 12 L of oxygen on nonrebreather.  Covid illness: Diagnosed  with Covid on January 12.  Inflammatory markers are elevated.  CRP of 18.9.  Started on Solu-Medrol and remdesivir.  Actemra not given due to patient's poor prognosis.  Continue to monitor inflammatory markers  Severe dehydration/hypernatremia: Not eating or drinking in the facility.  Looks dehydrated.  Given a bolus of normal saline in the emergency department.  We will start her on D5 for hypernatremia.  We will continue  free water.  It might be difficult to give her free water due to her mental status.  We did not put NG tube because of her mental status, non-cooperation,poor prognosis. Check BMP every 4 hours.  AKI: Due to severe dehydration.  Continue to monitor BMP  Leukocytosis: Most likely secondary to hemoconcentration from dehydration.  Procalcitonin 0.2.  History of stroke: Aphasic at baseline due to stroke, bedbound.  Continue supportive care.  On statin  History of advanced dementia: Not alert or oriented at baseline.  History of DVT: On Eliquis which we will continue.  Hypertension: Blood pressure currently soft.  On metoprolol at home.      Severity of Illness: The appropriate patient status for this patient is INPATIENT.   DVT prophylaxis: Eliquis Code Status: DNR Family Communication: Discussed with daughter on phone Consults called: None    Burnadette Pop MD [facility] Pager [phone]  If 7PM-7AM, please contact night-coverage [email or web address] Password [facility]  09-Jun-2019, 5:40 PM

## 2019-05-30 NOTE — ED Notes (Signed)
CRITICAL VALUE STICKER  CRITICAL VALUE: Sodium 171, Lactic Acid 6.3  RECEIVER (on-site recipient of call): Sherian Maroon, RN   DATE & TIME NOTIFIED: 05/17/2019 1351  MESSENGER (representative from lab): Tammy  MD NOTIFIED: Dr. Charlotta Newton, RN  TIME OF NOTIFICATION: 1351  RESPONSE:

## 2019-05-31 DIAGNOSIS — R6521 Severe sepsis with septic shock: Secondary | ICD-10-CM

## 2019-05-31 DIAGNOSIS — B952 Enterococcus as the cause of diseases classified elsewhere: Secondary | ICD-10-CM

## 2019-05-31 DIAGNOSIS — A409 Streptococcal sepsis, unspecified: Secondary | ICD-10-CM

## 2019-05-31 DIAGNOSIS — N179 Acute kidney failure, unspecified: Secondary | ICD-10-CM

## 2019-05-31 DIAGNOSIS — E872 Acidosis: Secondary | ICD-10-CM

## 2019-05-31 DIAGNOSIS — A419 Sepsis, unspecified organism: Secondary | ICD-10-CM

## 2019-05-31 DIAGNOSIS — I63312 Cerebral infarction due to thrombosis of left middle cerebral artery: Secondary | ICD-10-CM

## 2019-05-31 DIAGNOSIS — L8990 Pressure ulcer of unspecified site, unspecified stage: Secondary | ICD-10-CM | POA: Diagnosis present

## 2019-05-31 DIAGNOSIS — B9689 Other specified bacterial agents as the cause of diseases classified elsewhere: Secondary | ICD-10-CM

## 2019-05-31 DIAGNOSIS — R7881 Bacteremia: Secondary | ICD-10-CM

## 2019-05-31 DIAGNOSIS — E870 Hyperosmolality and hypernatremia: Secondary | ICD-10-CM

## 2019-05-31 LAB — CBC WITH DIFFERENTIAL/PLATELET
Abs Immature Granulocytes: 0.14 10*3/uL — ABNORMAL HIGH (ref 0.00–0.07)
Basophils Absolute: 0 10*3/uL (ref 0.0–0.1)
Basophils Relative: 0 %
Eosinophils Absolute: 0 10*3/uL (ref 0.0–0.5)
Eosinophils Relative: 0 %
HCT: 43.3 % (ref 36.0–46.0)
Hemoglobin: 13 g/dL (ref 12.0–15.0)
Immature Granulocytes: 1 %
Lymphocytes Relative: 6 %
Lymphs Abs: 1.2 10*3/uL (ref 0.7–4.0)
MCH: 29.5 pg (ref 26.0–34.0)
MCHC: 30 g/dL (ref 30.0–36.0)
MCV: 98.2 fL (ref 80.0–100.0)
Monocytes Absolute: 0.5 10*3/uL (ref 0.1–1.0)
Monocytes Relative: 2 %
Neutro Abs: 17.8 10*3/uL — ABNORMAL HIGH (ref 1.7–7.7)
Neutrophils Relative %: 91 %
Platelets: 283 10*3/uL (ref 150–400)
RBC: 4.41 MIL/uL (ref 3.87–5.11)
RDW: 14.6 % (ref 11.5–15.5)
WBC: 19.6 10*3/uL — ABNORMAL HIGH (ref 4.0–10.5)
nRBC: 0 % (ref 0.0–0.2)

## 2019-05-31 LAB — BASIC METABOLIC PANEL
Anion gap: 13 (ref 5–15)
Anion gap: 12 (ref 5–15)
BUN: 56 mg/dL — ABNORMAL HIGH (ref 8–23)
BUN: 50 mg/dL — ABNORMAL HIGH (ref 8–23)
CO2: 24 mmol/L (ref 22–32)
CO2: 23 mmol/L (ref 22–32)
Calcium: 8.1 mg/dL — ABNORMAL LOW (ref 8.9–10.3)
Calcium: 8.3 mg/dL — ABNORMAL LOW (ref 8.9–10.3)
Chloride: 127 mmol/L — ABNORMAL HIGH (ref 98–111)
Chloride: 127 mmol/L — ABNORMAL HIGH (ref 98–111)
Creatinine, Ser: 1.18 mg/dL — ABNORMAL HIGH (ref 0.44–1.00)
Creatinine, Ser: 1.07 mg/dL — ABNORMAL HIGH (ref 0.44–1.00)
GFR calc Af Amer: 51 mL/min — ABNORMAL LOW (ref >60)
GFR calc Af Amer: 57 mL/min — ABNORMAL LOW (ref >60)
GFR calc non Af Amer: 44 mL/min — ABNORMAL LOW (ref >60)
GFR calc non Af Amer: 49 mL/min — ABNORMAL LOW (ref >60)
Glucose, Bld: 202 mg/dL — ABNORMAL HIGH (ref 70–99)
Glucose, Bld: 118 mg/dL — ABNORMAL HIGH (ref 70–99)
Potassium: 3.3 mmol/L — ABNORMAL LOW (ref 3.5–5.1)
Potassium: 3.8 mmol/L (ref 3.5–5.1)
Sodium: 164 mmol/L (ref 135–145)
Sodium: 162 mmol/L (ref 135–145)

## 2019-05-31 LAB — BLOOD CULTURE ID PANEL (REFLEXED)

## 2019-05-31 LAB — COMPREHENSIVE METABOLIC PANEL
ALT: 51 U/L — ABNORMAL HIGH (ref 0–44)
AST: 60 U/L — ABNORMAL HIGH (ref 15–41)
Albumin: 2.3 g/dL — ABNORMAL LOW (ref 3.5–5.0)
Alkaline Phosphatase: 71 U/L (ref 38–126)
Anion gap: 10 (ref 5–15)
BUN: 57 mg/dL — ABNORMAL HIGH (ref 8–23)
CO2: 24 mmol/L (ref 22–32)
Calcium: 8.3 mg/dL — ABNORMAL LOW (ref 8.9–10.3)
Chloride: 129 mmol/L — ABNORMAL HIGH (ref 98–111)
Creatinine, Ser: 1.22 mg/dL — ABNORMAL HIGH (ref 0.44–1.00)
GFR calc Af Amer: 49 mL/min — ABNORMAL LOW (ref >60)
GFR calc non Af Amer: 42 mL/min — ABNORMAL LOW (ref >60)
Glucose, Bld: 228 mg/dL — ABNORMAL HIGH (ref 70–99)
Potassium: 3.3 mmol/L — ABNORMAL LOW (ref 3.5–5.1)
Sodium: 163 mmol/L (ref 135–145)
Total Bilirubin: 0.3 mg/dL (ref 0.3–1.2)
Total Protein: 6.5 g/dL (ref 6.5–8.1)

## 2019-05-31 LAB — ABO/RH: ABO/RH(D): AB POS

## 2019-05-31 LAB — D-DIMER, QUANTITATIVE: D-Dimer, Quant: 0.88 ug/mL-FEU — ABNORMAL HIGH (ref 0.00–0.50)

## 2019-05-31 LAB — GLUCOSE, CAPILLARY
Glucose-Capillary: 222 mg/dL — ABNORMAL HIGH (ref 70–99)
Glucose-Capillary: 149 mg/dL — ABNORMAL HIGH (ref 70–99)
Glucose-Capillary: 114 mg/dL — ABNORMAL HIGH (ref 70–99)
Glucose-Capillary: 153 mg/dL — ABNORMAL HIGH (ref 70–99)

## 2019-05-31 LAB — LACTIC ACID, PLASMA: Lactic Acid, Venous: 1.7 mmol/L (ref 0.5–1.9)

## 2019-05-31 LAB — C-REACTIVE PROTEIN: CRP: 14 mg/dL — ABNORMAL HIGH (ref <1.0)

## 2019-05-31 LAB — FERRITIN: Ferritin: 234 ng/mL (ref 11–307)

## 2019-05-31 MED ORDER — SODIUM CHLORIDE 0.9 % IV SOLN
2.0000 g | INTRAVENOUS | Status: DC
  Administered 2019-05-31 – 2019-06-01 (×2): 2 g via INTRAVENOUS
  Filled 2019-05-31 (×3): qty 20

## 2019-05-31 NOTE — Progress Notes (Addendum)
[facility] PROGRESS NOTE    Progress Note  [name]  GEX:[id number] DOB: July 30, 1940 DOA: 2019-06-17 PCP: Donald Prose, MD     Brief Narrative:   [name] is an 79 y.o. female past medical history significant for ischemic stroke, residual facal weakness, advanced dementia,  essential hypertension and DVT who was from [facility] nursing facility as she as she became high toxic.  She was recently diagnosed with COVID-19 on May 13, 2019 she has not had any symptoms until the day prior to admission when she was found to be satting 60% on room air.  Patient is confused most of the history was obtained from the chart and family members.  Assessment/Plan:   Acute respiratory failure with hypoxia due to acute respiratory distress syndrome due to COVID-19 and possibly COVID-19 viral infection: She is currently requiring 3 L of oxygen keep saturations greater than 90%, her inflammatory markers are significantly elevated. She was started on IV remdesivir and steroids, continue vitamin C and zinc.  Sepsis due to Proteus bacteremia: In the setting of a sacral decubitus ulcer by the nurse that her blood culture ID reflex panel was  positive for Proteus, will start on IV Rocephin. We will await sensitivities.  Acute kidney injury/dehydration: Likely prerenal with a baseline creatinine of less than 1 on admission 1.6 she was started on D5 due to  hypernatremia, her creatinine is slowly improving.  Severe hypovolemic hypernatremia: Likely due to significant decreased oral intake.  Was given a bolus normal saline in the ED started on D5, will encourage oral free water. Patient is only responsive to pain this morning, her sodium is improving nicely this morning is 164.  Leukocytosis: Likely reactive her procalcitonin is low yield she has remained afebrile. It is to note that she does have a sacral decubitus necrotic ulcer, which by exam does not appear to be infected.  History  of stroke: Currently bedbound continue Eliquis and statins.  Acute confusional state superimposed and probably acute metabolic encephalopathy advanced dementia: Likely due to infectious etiology, the patient continues to be nonverbal.  History of DVT: On Eliquis.  Essential hypertension: Antihypertensive medications were held due to soft blood pressure.  Ethics/goals of care: We will need to have a long discussion with family about her condition and health status as she has an extremely poor prognosis. I have a 79 year old female encephalopathic requiring 3 L of oxygen with new COVID-19 pneumonia and a necrotic stage III sacral decubitus ulcer with new acute renal failure hyponatremia and not able to drink orals who has been bedbound for a year.  Stage III sacral decubitus ulcer present on admission: Consult wound care.    RN Pressure Injury Documentation: Pressure Injury 2019/06/17 Sacrum Mid Stage 3 -  Full thickness tissue loss. Subcutaneous fat may be visible but bone, tendon or muscle are NOT exposed. (Active)  06-17-2019 1900  Location: Sacrum  Location Orientation: Mid  Staging: Stage 3 -  Full thickness tissue loss. Subcutaneous fat may be visible but bone, tendon or muscle are NOT exposed.  Wound Description (Comments):   Present on Admission: Yes    Estimated body mass index is 19.55 kg/m as calculated from the following:   Height as of 07/07/15: 5' (1.524 m).   Weight as of this encounter: 45.4 kg.   DVT prophylaxis: Lovenox Family Communication:none Disposition Plan/Barrier to D/C: She will probably need to go to skilled nursing facility, but the patient has an extremely poor prognoses. Code Status:  Code Status Orders  (From admission, onward)         Start     Ordered   05/20/2019 1703  Do not attempt resuscitation (DNR)  Continuous    Question Answer Comment  In the event of cardiac or respiratory ARREST Do not call a "code blue"   In the event of cardiac  or respiratory ARREST Do not perform Intubation, CPR, defibrillation or ACLS   In the event of cardiac or respiratory ARREST Use medication by any route, position, wound care, and other measures to relive pain and suffering. May use oxygen, suction and manual treatment of airway obstruction as needed for comfort.      05/17/2019 1703        Code Status History    Date Active Date Inactive Code Status Order ID Comments User Context   05/07/2019 1639 05/12/2019 1703 DNR [id number]  Burnadette Pop, MD ED   06/06/2015 0136 06/08/2015 1703 Full Code [id number]  Bobette Mo, MD Inpatient   06/06/2015 0107 06/06/2015 0136 Full Code [id number]  Bobette Mo, MD Inpatient   05/22/2015 0505 05/25/2015 0144 Full Code [id number]  Clydie Braun, MD ED   05/08/2015 2300 05/10/2015 2336 Full Code [id number]  Ron Parker, MD ED   Advance Care Planning Activity    Advance Directive Documentation     Most Recent Value  Type of Advance Directive  Healthcare Power of Attorney  Pre-existing out of facility DNR order (yellow form or pink MOST form)  --  "MOST" Form in Place?  --        IV Access:    Peripheral IV   Procedures and diagnostic studies:   CT CHEST WO CONTRAST  Result Date: 05/18/2019 CLINICAL DATA:  Respiratory failure. COVID positive. EXAM: CT CHEST WITHOUT CONTRAST TECHNIQUE: Multidetector CT imaging of the chest was performed following the standard protocol without IV contrast. Patient was confused and combative and had difficulty tolerating the exam. COMPARISON:  Radiograph earlier this day. FINDINGS: Moderate motion artifact limits detailed assessment. Cardiovascular: Aortic atherosclerosis and tortuosity. No aortic aneurysm. Heart is normal in size. There are coronary artery calcifications. No pericardial effusion. Mediastinum/Nodes: Moderate hiatal hernia. No obvious adenopathy, allowing for motion. Thyroid gland is obscured. Anterior diaphragmatic defect with herniation of  upper abdominal fat anterior to the right heart, no associated inflammation. Lungs/Pleura: Pulmonary evaluation significantly limited by motion. Scattered bilateral ground-glass opacities in a peripheral predominant distribution, greatest within the right upper and lower lobes. Minimal pleural thickening without frank pleural effusion. Lower trachea and central mainstem bronchi are patent, upper trachea obscured. Upper Abdomen: Gallbladder appears distended, more detailed evaluation is limited. Significant motion artifact in the upper abdomen. There is upper abdominal atherosclerosis. Musculoskeletal: Scoliosis and degenerative change in the spine. More detailed osseous assessment is limited by motion. IMPRESSION: 1. Scattered bilateral ground-glass opacities in a peripheral predominant distribution, greatest within the right upper and lower lobes. Findings are most consistent with COVID-19 pneumonia. Parenchymal involvement appears mild. Significant patient motion artifact limits detailed assessment. 2. Moderate hiatal hernia. 3. Advanced aortic atherosclerosis.  Coronary artery calcifications. Aortic Atherosclerosis (ICD10-I70.0). Electronically Signed   By: Narda Rutherford M.D.   On: 05/09/2019 22:15   DG Chest Port 1 View  Result Date: 05/11/2019 CLINICAL DATA:  COVID positive, hypoxia EXAM: PORTABLE CHEST 1 VIEW COMPARISON:  06/15/2015 FINDINGS: There is no focal consolidation. There is no pleural effusion or pneumothorax. The heart and mediastinal contours are unremarkable. There is thoracic aortic atherosclerosis. There  is no acute osseous abnormality. IMPRESSION: No active disease. Electronically Signed   By: Elige Ko   On: 05/09/2019 13:10     Medical Consultants:    None.  Anti-Infectives:   none  Subjective:    [name] nonverbal  Objective:    Vitals:   05/31/19 0029 05/31/19 0154 05/31/19 0400 05/31/19 0440  BP: 112/71   118/85  Pulse: 77  76 73  Resp: 18  19 18    Temp: (!) 96.9 F (36.1 C)   (!) 97.5 F (36.4 C)  TempSrc: Axillary   Axillary  SpO2: 100%  100% 100%  Weight:  45.4 kg     SpO2: 100 % O2 Flow Rate (L/min): 3 L/min  No intake or output data in the 24 hours ending 05/31/19 0711 Filed Weights   05/31/19 0154  Weight: 45.4 kg    Exam: General exam: In no acute distress. Respiratory system: Good air movement and diffuse crackles bilaterally Cardiovascular system: S1 & S2 heard, RRR. No JVD. Gastrointestinal system: Abdomen is nondistended, soft and nontender.  Central nervous system: Only withdrawing to pain Extremities: No pedal edema. Skin: Stage III-IV necrotic decubitus ulcer  Data Reviewed:    Labs: Basic Metabolic Panel: Recent Labs  Lab 05/12/2019 1243 05/31/19 0500  NA 171* PENDING  K 3.9 PENDING  CL 128* PENDING  CO2 27 PENDING  GLUCOSE 153* 228*  BUN 58* 57*  CREATININE 1.65* 1.22*  CALCIUM 9.5 PENDING   GFR CrCl cannot be calculated (Unknown ideal weight.). Liver Function Tests: Recent Labs  Lab 05/19/2019 1243 05/31/19 0500  AST 69* 60*  ALT 64* 51*  ALKPHOS 86 71  BILITOT 1.0 0.3  PROT 8.2* 6.5  ALBUMIN 2.9* 2.3*   No results for input(s): LIPASE, AMYLASE in the last 168 hours. No results for input(s): AMMONIA in the last 168 hours. Coagulation profile No results for input(s): INR, PROTIME in the last 168 hours. COVID-19 Labs  Recent Labs    05/25/2019 1243 05/31/19 0500  DDIMER 1.26* 0.88*  FERRITIN 228 234  LDH 303*  --   CRP 18.9* 14.0*    No results found for: SARSCOV2NAA  CBC: Recent Labs  Lab 05/16/2019 1243 05/31/19 0500  WBC 23.2* 19.6*  NEUTROABS 19.0* 17.8*  HGB 15.1* 13.0  HCT 52.3* 43.3  MCV 99.4 98.2  PLT 402* 283   Cardiac Enzymes: No results for input(s): CKTOTAL, CKMB, CKMBINDEX, TROPONINI in the last 168 hours. BNP (last 3 results) No results for input(s): PROBNP in the last 8760 hours. CBG: Recent Labs  Lab 05/10/2019 1952  GLUCAP 118*    D-Dimer: Recent Labs    05/17/2019 1243 05/31/19 0500  DDIMER 1.26* 0.88*   Hgb A1c: No results for input(s): HGBA1C in the last 72 hours. Lipid Profile: Recent Labs    05/25/2019 1243  TRIG 127   Thyroid function studies: No results for input(s): TSH, T4TOTAL, T3FREE, THYROIDAB in the last 72 hours.  Invalid input(s): FREET3 Anemia work up: Recent Labs    05/26/2019 1243 05/31/19 0500  FERRITIN 228 234   Sepsis Labs: Recent Labs  Lab 05/29/2019 1243 05/31/19 0500  PROCALCITON 0.20  --   WBC 23.2* 19.6*  LATICACIDVEN 6.3* 1.7   Microbiology No results found for this or any previous visit (from the past 240 hour(s)).   Medications:   . apixaban  5 mg Oral BID  . vitamin C  500 mg Oral BID  . atorvastatin  40 mg Oral Daily  .  feeding supplement  1 Container Oral TID BM  . free water  400 mL Per Tube Q8H  . insulin aspart  0-9 Units Subcutaneous TID WC  . methylPREDNISolone (SOLU-MEDROL) injection  40 mg Intravenous Q12H  . zinc sulfate  220 mg Oral Daily   Continuous Infusions: . dextrose 100 mL/hr at 05/31/19 0459  . remdesivir 100 mg in NS 100 mL        LOS: 1 day   Marinda Elk  [facility]  05/31/2019, 7:11 AM

## 2019-05-31 NOTE — Progress Notes (Signed)
Initial Nutrition Assessment  DOCUMENTATION CODES:   Not applicable  INTERVENTION:  Monitor for GOCs Recommend placing NGT for initiation of tube feedings   -Vital 1.2 @ 50 ml/hr   -Prostat 30 ml BID  Provides 1640 kcal, 120 grams of protein, and 972 ml free water  Continue Boost Breeze TID  NUTRITION DIAGNOSIS:   Inadequate oral intake related to catabolic illness, wound healing, acute illness(acute respiratroy distress syndrome due to Covid-19; sepsis due to Proteus bacteremia in the setting of necrotic stage III decubitus ulcer) as evidenced by other (comment)(per chart, pt with no po intake 2-3 days prior to admission, pt unable to drink orals at this time, CL diet order insufficient to meet pt estimated needs).    GOAL:   Patient will meet greater than or equal to 90% of their needs    MONITOR:   I & O's, PO intake, Weight trends  REASON FOR ASSESSMENT:   Malnutrition Screening Tool    ASSESSMENT:  RD working remotely.  79 year old female with past medical history of ischemic stroke with residual aphasia, bedbound, advanced dementia, HTN, DVT, diagnosed with COVID-19 on 1/12, presented from nursing facility with hypoxia and no po intake over the last 2-3 days. In ED, CXR did not show any pneumonia, pt remained hypoxic requiring 12 L of oxygen, Na 171, and AKI.  Patient admitted for acute respiratory failure with hypoxia due to acute respiratory distress syndrome due to COVID 19; Sepsis due to Proteus bacteremia in the setting of sacral decubitus ulcer.  Per notes, extremely poor prognosis, goals of care discussion with family pending.   Patient on CL diet, no meals at this time for review. Per notes, patient not able to drink orals at this time. Recommend initiation of tube feedings given patient reported with no po intake 2-3 days prior to admission, necrotic stage III sacral decubitus ulcer, and catabolic acute illness.   Non-pitting LUE edema per RN  assessment.  Current wt 45.5 kg (100.1 lbs) No recent wt history available for review, last weight prior to admission 61.7 kg in 2017. Given h/o advanced dementia, ischemic stroke, bedbound status, and necrotic decubitus ulcer highly suspect severe malnutrition, however unable to identify at this time.   Medications reviewed and include: Vit C, SS novolog, Methylprednisolone, Zinc sulfate, Rocephin, Remdesivir D5 400 ml free water every 8  hrs  Labs: CBGs 149-222, Na 162 (H), Cr 1.07 (H), BUN 50 (H), WBC 19.6 (H)   NUTRITION - FOCUSED PHYSICAL EXAM: Unable to complete at this time, RD working remotely.  Diet Order:   Diet Order            Diet clear liquid Room service appropriate? Yes; Fluid consistency: Thin  Diet effective now              EDUCATION NEEDS:   No education needs have been identified at this time  Skin:  Skin Assessment: Skin Integrity Issues: Skin Integrity Issues:: Stage III Stage III: sacrum  Last BM:  Unknown  Height:   Ht Readings from Last 1 Encounters:  07/07/15 5' (1.524 m)    Weight:   Wt Readings from Last 1 Encounters:  05/31/19 45.4 kg    Ideal Body Weight:     BMI:  Body mass index is 19.55 kg/m.  Estimated Nutritional Needs:   Kcal:  1600-1800  Protein:  91-114 (2-2.5g/kg)  Fluid:  >/= 1.6 L/day   Lars Masson, RD, LDN Clinical Nutrition [facility] Telephone [phone] After Hours/Weekend Pager:  [phone]  

## 2019-05-31 NOTE — Progress Notes (Signed)
CRITICAL VALUE ALERT  Critical Value: Sodium 163  Date & Time Notied:  05/31/2019  /  07:25  Provider Notified: Dr. Marinda Elk  Orders Received/Actions taken: Lab is improving.  No orders received at present.

## 2019-05-31 NOTE — Progress Notes (Signed)
Pt sleeping. Checked wound with night shift. Necrosis noted, foul odor noted. Pt moved to L side

## 2019-05-31 NOTE — Plan of Care (Signed)
   Vital Signs MEWS/VS Documentation       05/31/2019 1130 05/31/2019 1950 05/31/2019 1955 05/31/2019 2135   MEWS Score:  2  2  2  2    MEWS Score Color:  Yellow  Yellow  Yellow  Yellow   Resp:  --  --  17  --   Pulse:  --  --  82  --   BP:  --  --  103/80  --   Temp:  (!) 97.5 F (36.4 C)  --  98.3 F (36.8 C)  --   O2 Device:  --  --  Nasal Cannula  --   O2 Flow Rate (L/min):  --  --  2 L/min  --   Level of Consciousness:  --  --  --  Responds to Pain        POC reviewed with pt. Pt is confused, sacral wound .    Rozanne Heumann 05/31/2019,10:19 PM

## 2019-05-31 NOTE — Progress Notes (Signed)
   05/31/19 2135  Pressure Injury 05/05/2019 Sacrum Mid Stage 3 -  Full thickness tissue loss. Subcutaneous fat may be visible but bone, tendon or muscle are NOT exposed.  Date First Assessed/Time First Assessed: 05/24/2019 1900   Location: Sacrum  Location Orientation: Mid  Staging: Stage 3 -  Full thickness tissue loss. Subcutaneous fat may be visible but bone, tendon or muscle are NOT exposed.  Present on Admission: Yes  Wound Image  (Stage 3 wound noted has a lot of drainage Wet to dry)  Dressing Type Gauze (Comment) (Wet to dry)  Dressing Changed  Dressing Change Frequency Twice a day  State of Healing Eschar  Site / Wound Assessment Other (Comment) (Drainage notedFull thickness tissue loss)  Drainage Amount Moderate  Drainage Description Purulent;Odor  Treatment Cleansed

## 2019-05-31 NOTE — Progress Notes (Signed)
PHARMACY - PHYSICIAN COMMUNICATION CRITICAL VALUE ALERT - BLOOD CULTURE IDENTIFICATION (BCID)  [name] is an 79 y.o. female who presented to [facility] on 05/09/2019 with a chief complaint of hypoxia secondary to COVID.  Patient initially tested positive at facility on 1/12 but did not develop symptoms until 1/28.  Patient with decreased PO intake for last 48 hours and was dehydrated with elevated Na on admission.  Assessment:  1/2 BCx obtained 1/29 wih GNR, identified at Proteus species on BCID.    Name of physician (or Provider) Contacted: Dr. David Stall via secure chat  Current antibiotics: none  Changes to prescribed antibiotics recommended:  Start Rocephin 2gm IV q24h.  Follow-up sensitivity data. Recommendations accepted by provider  Results for orders placed or performed during the hospital encounter of 05/17/2019  Blood Culture ID Panel (Reflexed) (Collected: 05/13/2019 12:57 PM)  Result Value Ref Range   Enterococcus species NOT DETECTED NOT DETECTED   Listeria monocytogenes NOT DETECTED NOT DETECTED   Staphylococcus species NOT DETECTED NOT DETECTED   Staphylococcus aureus (BCID) NOT DETECTED NOT DETECTED   Streptococcus species NOT DETECTED NOT DETECTED   Streptococcus agalactiae NOT DETECTED NOT DETECTED   Streptococcus pneumoniae NOT DETECTED NOT DETECTED   Streptococcus pyogenes NOT DETECTED NOT DETECTED   Acinetobacter baumannii NOT DETECTED NOT DETECTED   Enterobacteriaceae species DETECTED (A) NOT DETECTED   Enterobacter cloacae complex NOT DETECTED NOT DETECTED   Escherichia coli NOT DETECTED NOT DETECTED   Klebsiella oxytoca NOT DETECTED NOT DETECTED   Klebsiella pneumoniae NOT DETECTED NOT DETECTED   Proteus species DETECTED (A) NOT DETECTED   Serratia marcescens NOT DETECTED NOT DETECTED   Carbapenem resistance NOT DETECTED NOT DETECTED   Haemophilus influenzae NOT DETECTED NOT DETECTED   Neisseria meningitidis NOT DETECTED NOT DETECTED   Pseudomonas  aeruginosa NOT DETECTED NOT DETECTED   Candida albicans NOT DETECTED NOT DETECTED   Candida glabrata NOT DETECTED NOT DETECTED   Candida krusei NOT DETECTED NOT DETECTED   Candida parapsilosis NOT DETECTED NOT DETECTED   Candida tropicalis NOT DETECTED NOT DETECTED    Adie Vilar, Judie Bonus 05/31/2019  12:14 PM

## 2019-06-01 LAB — CBC WITH DIFFERENTIAL/PLATELET
Abs Immature Granulocytes: 0.1 10*3/uL — ABNORMAL HIGH (ref 0.00–0.07)
Basophils Absolute: 0 10*3/uL (ref 0.0–0.1)
Basophils Relative: 0 %
Eosinophils Absolute: 0 10*3/uL (ref 0.0–0.5)
Eosinophils Relative: 0 %
HCT: 36.3 % (ref 36.0–46.0)
Hemoglobin: 10.8 g/dL — ABNORMAL LOW (ref 12.0–15.0)
Immature Granulocytes: 1 %
Lymphocytes Relative: 6 %
Lymphs Abs: 0.9 10*3/uL (ref 0.7–4.0)
MCH: 29.3 pg (ref 26.0–34.0)
MCHC: 29.8 g/dL — ABNORMAL LOW (ref 30.0–36.0)
MCV: 98.4 fL (ref 80.0–100.0)
Monocytes Absolute: 0.5 10*3/uL (ref 0.1–1.0)
Monocytes Relative: 3 %
Neutro Abs: 13.7 10*3/uL — ABNORMAL HIGH (ref 1.7–7.7)
Neutrophils Relative %: 90 %
Platelets: 223 10*3/uL (ref 150–400)
RBC: 3.69 MIL/uL — ABNORMAL LOW (ref 3.87–5.11)
RDW: 14.3 % (ref 11.5–15.5)
WBC: 15.2 10*3/uL — ABNORMAL HIGH (ref 4.0–10.5)
nRBC: 0 % (ref 0.0–0.2)

## 2019-06-01 LAB — BASIC METABOLIC PANEL
Anion gap: 8 (ref 5–15)
BUN: 51 mg/dL — ABNORMAL HIGH (ref 8–23)
CO2: 26 mmol/L (ref 22–32)
Calcium: 8.1 mg/dL — ABNORMAL LOW (ref 8.9–10.3)
Chloride: 125 mmol/L — ABNORMAL HIGH (ref 98–111)
Creatinine, Ser: 1.05 mg/dL — ABNORMAL HIGH (ref 0.44–1.00)
GFR calc Af Amer: 58 mL/min — ABNORMAL LOW (ref >60)
GFR calc non Af Amer: 50 mL/min — ABNORMAL LOW (ref >60)
Glucose, Bld: 154 mg/dL — ABNORMAL HIGH (ref 70–99)
Potassium: 3.2 mmol/L — ABNORMAL LOW (ref 3.5–5.1)
Sodium: 159 mmol/L — ABNORMAL HIGH (ref 135–145)

## 2019-06-01 LAB — GLUCOSE, CAPILLARY
Glucose-Capillary: 157 mg/dL — ABNORMAL HIGH (ref 70–99)
Glucose-Capillary: 137 mg/dL — ABNORMAL HIGH (ref 70–99)
Glucose-Capillary: 105 mg/dL — ABNORMAL HIGH (ref 70–99)
Glucose-Capillary: 112 mg/dL — ABNORMAL HIGH (ref 70–99)
Glucose-Capillary: 145 mg/dL — ABNORMAL HIGH (ref 70–99)

## 2019-06-01 LAB — D-DIMER, QUANTITATIVE: D-Dimer, Quant: 1.09 ug/mL-FEU — ABNORMAL HIGH (ref 0.00–0.50)

## 2019-06-01 LAB — C-REACTIVE PROTEIN: CRP: 8.1 mg/dL — ABNORMAL HIGH (ref <1.0)

## 2019-06-01 MED ORDER — POTASSIUM CHLORIDE 2 MEQ/ML IV SOLN
INTRAVENOUS | Status: DC
  Filled 2019-06-01 (×2): qty 1000

## 2019-06-01 MED ORDER — DEXTROSE 5 % IV SOLN: INTRAVENOUS | Status: DC

## 2019-06-01 NOTE — Progress Notes (Signed)
[facility] PROGRESS NOTE    Progress Note  [name]  POE:[id number] DOB: 07-31-1940 DOA: 05/13/2019 PCP: Deatra James, MD     Brief Narrative:   [name] is an 79 y.o. female past medical history significant for ischemic stroke, residual facal weakness, advanced dementia,  essential hypertension and DVT who was from [facility] nursing facility as she as she became high toxic.  She was recently diagnosed with COVID-19 on May 13, 2019 she has not had any symptoms until the day prior to admission when she was found to be satting 60% on room air.  Patient is confused most of the history was obtained from the chart and family members.  Assessment/Plan:   Acute respiratory failure with hypoxia due to acute respiratory distress syndrome due to COVID-19 and possibly COVID-19 viral infection: She is now on 2 L of oxygen to keep saturations at 100%, I think we can wean her oxygen down. Continue IV remdesivir and steroids vitamin C and zinc. Continue strict I's and O's and daily weights prone the patient for at least 16 hours a day. She continues to be unresponsive she only responsive to painful stimuli.  We will place her n.p.o.  Sepsis due to Proteus bacteremia: In the setting of sacral decubitus ulcer UA not done on admission, blood culture ID reflex panel was positive for Proteus. She was started on IV Rocephin 05/31/2019 sensitivities are pending she will need antibiotics for at least 2 weeks  Acute kidney injury/dehydration: Likely prerenal with a baseline creatinine around 1 on admission 1.6 she was started on D5W due to her hyponatremia see below for further details.  Creatinine is slowly improving.  Severe hypovolemic hypernatremia: In the setting of sepsis and significant decreased oral intake, she has been fluid resuscitated, her sodium this morning is 159 which is a nice improvement over the last several days.  New hyperkalemia: We will change her IV fluids to  D5 with potassium supplementation.  History of stroke: Currently bedbound continue Eliquis and statins.  Acute confusional state superimposed and probably acute metabolic encephalopathy advanced dementia: Likely due to infectious etiology, the patient continues to be nonverbal.  History of DVT: On Eliquis.  Essential hypertension: Antihypertensive medications were held due to soft blood pressure.  Ethics/goals of care: We will need to have a long discussion with family about her condition and health status as she has an extremely poor prognosis. I have a 79 year old female encephalopathic requiring 3 L of oxygen with new COVID-19 pneumonia and a necrotic stage III sacral decubitus ulcer with new acute renal failure hyponatremia and not able to drink orals who has been bedbound for a year. To the family this morning and explained to them that the patient was only was positive to painful stimuli continues to be lethargic.  Stage III sacral decubitus ulcer present on admission: Consult wound care.    RN Pressure Injury Documentation: Pressure Injury 05/25/2019 Sacrum Mid Stage 3 -  Full thickness tissue loss. Subcutaneous fat may be visible but bone, tendon or muscle are NOT exposed. (Active)  05/20/2019 1900  Location: Sacrum  Location Orientation: Mid  Staging: Stage 3 -  Full thickness tissue loss. Subcutaneous fat may be visible but bone, tendon or muscle are NOT exposed.  Wound Description (Comments):   Present on Admission: Yes    Estimated body mass index is 19.55 kg/m as calculated from the following:   Height as of 07/07/15: 5' (1.524 m).   Weight as of this encounter: 45.4  kg.   DVT prophylaxis: Lovenox Family Communication:none Disposition Plan/Barrier to D/C: She has an extremely poor prognosis she is unlikely to survive this hospitalization. Code Status:     Code Status Orders  (From admission, onward)         Start     Ordered   05/13/2019 1703  Do not attempt  resuscitation (DNR)  Continuous    Question Answer Comment  In the event of cardiac or respiratory ARREST Do not call a "code blue"   In the event of cardiac or respiratory ARREST Do not perform Intubation, CPR, defibrillation or ACLS   In the event of cardiac or respiratory ARREST Use medication by any route, position, wound care, and other measures to relive pain and suffering. May use oxygen, suction and manual treatment of airway obstruction as needed for comfort.      05/17/2019 1703        Code Status History    Date Active Date Inactive Code Status Order ID Comments User Context   05/18/2019 1639 05/25/2019 1703 DNR [id number]  Burnadette Pop, MD ED   06/06/2015 0136 06/08/2015 1703 Full Code [id number]  Bobette Mo, MD Inpatient   06/06/2015 0107 06/06/2015 0136 Full Code [id number]  Bobette Mo, MD Inpatient   05/22/2015 0505 05/25/2015 0144 Full Code [id number]  Clydie Braun, MD ED   05/08/2015 2300 05/10/2015 2336 Full Code [id number]  Ron Parker, MD ED   Advance Care Planning Activity    Advance Directive Documentation     Most Recent Value  Type of Advance Directive  Healthcare Power of Attorney  Pre-existing out of facility DNR order (yellow form or pink MOST form)  --  "MOST" Form in Place?  --        IV Access:    Peripheral IV   Procedures and diagnostic studies:   CT CHEST WO CONTRAST  Result Date: 05/15/2019 CLINICAL DATA:  Respiratory failure. COVID positive. EXAM: CT CHEST WITHOUT CONTRAST TECHNIQUE: Multidetector CT imaging of the chest was performed following the standard protocol without IV contrast. Patient was confused and combative and had difficulty tolerating the exam. COMPARISON:  Radiograph earlier this day. FINDINGS: Moderate motion artifact limits detailed assessment. Cardiovascular: Aortic atherosclerosis and tortuosity. No aortic aneurysm. Heart is normal in size. There are coronary artery calcifications. No pericardial effusion.  Mediastinum/Nodes: Moderate hiatal hernia. No obvious adenopathy, allowing for motion. Thyroid gland is obscured. Anterior diaphragmatic defect with herniation of upper abdominal fat anterior to the right heart, no associated inflammation. Lungs/Pleura: Pulmonary evaluation significantly limited by motion. Scattered bilateral ground-glass opacities in a peripheral predominant distribution, greatest within the right upper and lower lobes. Minimal pleural thickening without frank pleural effusion. Lower trachea and central mainstem bronchi are patent, upper trachea obscured. Upper Abdomen: Gallbladder appears distended, more detailed evaluation is limited. Significant motion artifact in the upper abdomen. There is upper abdominal atherosclerosis. Musculoskeletal: Scoliosis and degenerative change in the spine. More detailed osseous assessment is limited by motion. IMPRESSION: 1. Scattered bilateral ground-glass opacities in a peripheral predominant distribution, greatest within the right upper and lower lobes. Findings are most consistent with COVID-19 pneumonia. Parenchymal involvement appears mild. Significant patient motion artifact limits detailed assessment. 2. Moderate hiatal hernia. 3. Advanced aortic atherosclerosis.  Coronary artery calcifications. Aortic Atherosclerosis (ICD10-I70.0). Electronically Signed   By: Narda Rutherford M.D.   On: 05/18/2019 22:15   DG Chest Port 1 View  Result Date: 05/10/2019 CLINICAL DATA:  COVID positive, hypoxia EXAM: PORTABLE CHEST  1 VIEW COMPARISON:  06/15/2015 FINDINGS: There is no focal consolidation. There is no pleural effusion or pneumothorax. The heart and mediastinal contours are unremarkable. There is thoracic aortic atherosclerosis. There is no acute osseous abnormality. IMPRESSION: No active disease. Electronically Signed   By: Elige Ko   On: 05/29/2019 13:10     Medical Consultants:    None.  Anti-Infectives:   none  Subjective:    [name] nonverbal  Objective:    Vitals:   05/31/19 0717 05/31/19 1130 05/31/19 1955 06/01/19 0400  BP: 119/65  103/80 115/61  Pulse: 80  82 71  Resp: 19  17 14   Temp: 97.7 F (36.5 C) (!) 97.5 F (36.4 C) 98.3 F (36.8 C) 97.8 F (36.6 C)  TempSrc:  Axillary Axillary Axillary  SpO2: 100%  96% 99%  Weight:       SpO2: 99 % O2 Flow Rate (L/min): 2 L/min   Intake/Output Summary (Last 24 hours) at 06/01/2019 0723 Last data filed at 06/01/2019 0525 Gross per 24 hour  Intake 2452.07 ml  Output 400 ml  Net 2052.07 ml   Filed Weights   05/31/19 0154  Weight: 45.4 kg    Exam: General exam: In no acute distress. Respiratory system: Good air movement and clear to auscultation. Cardiovascular system: S1 & S2 heard, RRR. No JVD. Gastrointestinal system: Abdomen is nondistended, soft and nontender.  Central nervous system: Only responsive to pain Extremities: No pedal edema. Skin: Stage III-IV necrotic decubitus ulcer  Data Reviewed:    Labs: Basic Metabolic Panel: Recent Labs  Lab 05/21/2019 1243 05/25/2019 1243 05/31/19 0500 05/31/19 0500 05/31/19 0840 05/31/19 0840 05/31/19 1233 06/01/19 0055  NA 171*  --  163*  --  164*  --  162* 159*  K 3.9   < > 3.3*   < > 3.3*   < > 3.8 3.2*  CL 128*  --  129*  --  127*  --  127* 125*  CO2 27  --  24  --  24  --  23 26  GLUCOSE 153*  --  228*  --  202*  --  118* 154*  BUN 58*  --  57*  --  56*  --  50* 51*  CREATININE 1.65*  --  1.22*  --  1.18*  --  1.07* 1.05*  CALCIUM 9.5  --  8.3*  --  8.1*  --  8.3* 8.1*   < > = values in this interval not displayed.   GFR CrCl cannot be calculated (Unknown ideal weight.). Liver Function Tests: Recent Labs  Lab 05/26/2019 1243 05/31/19 0500  AST 69* 60*  ALT 64* 51*  ALKPHOS 86 71  BILITOT 1.0 0.3  PROT 8.2* 6.5  ALBUMIN 2.9* 2.3*   No results for input(s): LIPASE, AMYLASE in the last 168 hours. No results for input(s): AMMONIA in the last 168 hours. Coagulation profile No  results for input(s): INR, PROTIME in the last 168 hours. COVID-19 Labs  Recent Labs    05/31/2019 1243 05/31/19 0500 06/01/19 0055  DDIMER 1.26* 0.88* 1.09*  FERRITIN 228 234  --   LDH 303*  --   --   CRP 18.9* 14.0* 8.1*    No results found for: SARSCOV2NAA  CBC: Recent Labs  Lab 05/19/2019 1243 05/31/19 0500 06/01/19 0055  WBC 23.2* 19.6* 15.2*  NEUTROABS 19.0* 17.8* 13.7*  HGB 15.1* 13.0 10.8*  HCT 52.3* 43.3 36.3  MCV 99.4 98.2 98.4  PLT 402* 283  223   Cardiac Enzymes: No results for input(s): CKTOTAL, CKMB, CKMBINDEX, TROPONINI in the last 168 hours. BNP (last 3 results) No results for input(s): PROBNP in the last 8760 hours. CBG: Recent Labs  Lab 05/20/2019 1952 05/31/19 0736 05/31/19 1106 05/31/19 1607 05/31/19 1957  GLUCAP 118* 222* 149* 114* 153*   D-Dimer: Recent Labs    05/31/19 0500 06/01/19 0055  DDIMER 0.88* 1.09*   Hgb A1c: No results for input(s): HGBA1C in the last 72 hours. Lipid Profile: Recent Labs    05/14/2019 1243  TRIG 127   Thyroid function studies: No results for input(s): TSH, T4TOTAL, T3FREE, THYROIDAB in the last 72 hours.  Invalid input(s): FREET3 Anemia work up: Recent Labs    05/19/2019 1243 05/31/19 0500  FERRITIN 228 234   Sepsis Labs: Recent Labs  Lab 05/14/2019 1243 05/31/19 0500 06/01/19 0055  PROCALCITON 0.20  --   --   WBC 23.2* 19.6* 15.2*  LATICACIDVEN 6.3* 1.7  --    Microbiology Recent Results (from the past 240 hour(s))  Blood Culture (routine x 2)     Status: None (Preliminary result)   Collection Time: 05/12/2019 12:43 PM   Specimen: BLOOD  Result Value Ref Range Status   Specimen Description   Final    BLOOD LEFT ANTECUBITAL Performed at [facility], [address] [place], [place] [address]    Special Requests   Final    BOTTLES DRAWN AEROBIC AND ANAEROBIC Blood Culture results may not be optimal due to an inadequate volume of blood received in culture bottles Performed  at [facility], [address] [place], [place] [address]    Culture   Final    NO GROWTH 1 DAY Performed at [facility], [address] [place], [place] [address]    Report Status PENDING  Incomplete  Blood Culture (routine x 2)     Status: None (Preliminary result)   Collection Time: 05/08/2019 12:57 PM   Specimen: BLOOD  Result Value Ref Range Status   Specimen Description   Final    BLOOD RIGHT ARM Performed at [facility], [address] [place], [place] [address]    Special Requests   Final    BOTTLES DRAWN AEROBIC ONLY Blood Culture adequate volume Performed at [facility], [address] [place], [place] [address]    Culture  Setup Time   Final    GRAM NEGATIVE RODS AEROBIC BOTTLE ONLY Organism ID to follow CRITICAL RESULT CALLED TO, READ BACK BY AND VERIFIED WITH: K. HAMMONS PHARMD, AT 1208 05/31/19 BY D.[facility] Performed at [facility], [address] [place], [place] [address]    Culture GRAM NEGATIVE RODS  Final   Report Status PENDING  Incomplete  Blood Culture ID Panel (Reflexed)     Status: Abnormal   Collection Time: 05/24/2019 12:57 PM  Result Value Ref Range Status   Enterococcus species NOT DETECTED NOT DETECTED Final   Listeria monocytogenes NOT DETECTED NOT DETECTED Final   Staphylococcus species NOT DETECTED NOT DETECTED Final   Staphylococcus aureus (BCID) NOT DETECTED NOT DETECTED Final   Streptococcus species NOT DETECTED NOT DETECTED Final   Streptococcus agalactiae NOT DETECTED NOT DETECTED Final   Streptococcus pneumoniae NOT DETECTED NOT DETECTED Final   Streptococcus pyogenes NOT DETECTED NOT DETECTED Final   Acinetobacter baumannii NOT DETECTED NOT DETECTED Final   Enterobacteriaceae species DETECTED (A) NOT DETECTED Final    Comment: Enterobacteriaceae  represent a large family of gram-negative bacteria, not a single organism. CRITICAL RESULT CALLED TO, READ BACK BY AND  VERIFIED WITH: K. HAMMONS PHARMD, AT 1208 05/31/19 BY D.VA NHOOK    Enterobacter cloacae complex NOT DETECTED NOT DETECTED Final   Escherichia coli NOT DETECTED NOT DETECTED Final   Klebsiella oxytoca NOT DETECTED NOT DETECTED Final   Klebsiella pneumoniae NOT DETECTED NOT DETECTED Final   Proteus species DETECTED (A) NOT DETECTED Final    Comment: CRITICAL RESULT CALLED TO, READ BACK BY AND VERIFIED WITH: K. HAMMONS PHARMD, AT 1208 05/31/19 BY D.VA NHOOK    Serratia marcescens NOT DETECTED NOT DETECTED Final   Carbapenem resistance NOT DETECTED NOT DETECTED Final   Haemophilus influenzae NOT DETECTED NOT DETECTED Final   Neisseria meningitidis NOT DETECTED NOT DETECTED Final   Pseudomonas aeruginosa NOT DETECTED NOT DETECTED Final   Candida albicans NOT DETECTED NOT DETECTED Final   Candida glabrata NOT DETECTED NOT DETECTED Final   Candida krusei NOT DETECTED NOT DETECTED Final   Candida parapsilosis NOT DETECTED NOT DETECTED Final   Candida tropicalis NOT DETECTED NOT DETECTED Final    Comment: Performed at [facility], [place] [address], [place], [place] [address]     Medications:   . apixaban  5 mg Oral BID  . vitamin C  500 mg Oral BID  . atorvastatin  40 mg Oral Daily  . feeding supplement  1 Container Oral TID BM  . free water  400 mL Per Tube Q8H  . insulin aspart  0-9 Units Subcutaneous TID WC  . methylPREDNISolone (SOLU-MEDROL) injection  40 mg Intravenous Q12H  . zinc sulfate  220 mg Oral Daily   Continuous Infusions: . cefTRIAXone (ROCEPHIN)  IV 2 g (05/31/19 1349)  . dextrose 100 mL/hr at 06/01/19 0559  . remdesivir 100 mg in NS 100 mL Stopped (05/31/19 1915)      LOS: 2 days   Charlynne Cousins  [facility]  06/01/2019, 7:23 AM

## 2019-06-02 LAB — CBC WITH DIFFERENTIAL/PLATELET
Abs Immature Granulocytes: 0.1 10*3/uL — ABNORMAL HIGH (ref 0.00–0.07)
Basophils Absolute: 0 10*3/uL (ref 0.0–0.1)
Basophils Relative: 0 %
Eosinophils Absolute: 0 10*3/uL (ref 0.0–0.5)
Eosinophils Relative: 0 %
HCT: 41 % (ref 36.0–46.0)
Hemoglobin: 12 g/dL (ref 12.0–15.0)
Immature Granulocytes: 1 %
Lymphocytes Relative: 5 %
Lymphs Abs: 0.7 10*3/uL (ref 0.7–4.0)
MCH: 28.6 pg (ref 26.0–34.0)
MCHC: 29.3 g/dL — ABNORMAL LOW (ref 30.0–36.0)
MCV: 97.6 fL (ref 80.0–100.0)
Monocytes Absolute: 0.3 10*3/uL (ref 0.1–1.0)
Monocytes Relative: 2 %
Neutro Abs: 13.1 10*3/uL — ABNORMAL HIGH (ref 1.7–7.7)
Neutrophils Relative %: 92 %
Platelets: 220 10*3/uL (ref 150–400)
RBC: 4.2 MIL/uL (ref 3.87–5.11)
RDW: 13.9 % (ref 11.5–15.5)
WBC: 14.3 10*3/uL — ABNORMAL HIGH (ref 4.0–10.5)
nRBC: 0 % (ref 0.0–0.2)

## 2019-06-02 LAB — D-DIMER, QUANTITATIVE: D-Dimer, Quant: 1.16 ug/mL-FEU — ABNORMAL HIGH (ref 0.00–0.50)

## 2019-06-02 LAB — BASIC METABOLIC PANEL
Anion gap: 10 (ref 5–15)
BUN: 40 mg/dL — ABNORMAL HIGH (ref 8–23)
CO2: 24 mmol/L (ref 22–32)
Calcium: 8.6 mg/dL — ABNORMAL LOW (ref 8.9–10.3)
Chloride: 119 mmol/L — ABNORMAL HIGH (ref 98–111)
Creatinine, Ser: 0.89 mg/dL (ref 0.44–1.00)
GFR calc Af Amer: >60 mL/min (ref >60)
GFR calc non Af Amer: >60 mL/min (ref >60)
Glucose, Bld: 141 mg/dL — ABNORMAL HIGH (ref 70–99)
Potassium: 3.5 mmol/L (ref 3.5–5.1)
Sodium: 153 mmol/L — ABNORMAL HIGH (ref 135–145)

## 2019-06-02 LAB — CULTURE, BLOOD (ROUTINE X 2): Special Requests: ADEQUATE

## 2019-06-02 LAB — GLUCOSE, CAPILLARY: Glucose-Capillary: 129 mg/dL — ABNORMAL HIGH (ref 70–99)

## 2019-06-02 LAB — C-REACTIVE PROTEIN: CRP: 4.9 mg/dL — ABNORMAL HIGH (ref <1.0)

## 2019-06-02 MED ORDER — METHYLPREDNISOLONE SODIUM SUCC 40 MG IJ SOLR: 20.0000 mg | Freq: Two times a day (BID) | INTRAMUSCULAR | Status: DC

## 2019-06-02 MED ORDER — COLLAGENASE 250 UNIT/GM EX OINT
TOPICAL_OINTMENT | Freq: Every day | CUTANEOUS | Status: DC
  Filled 2019-06-02: qty 30

## 2019-06-02 MED ORDER — MORPHINE 100MG IN NS 100ML (1MG/ML) PREMIX INFUSION
1.0000 mg/h | INTRAVENOUS | Status: DC
  Administered 2019-06-02 – 2019-06-04 (×2): 2 mg/h via INTRAVENOUS
  Filled 2019-06-02 (×2): qty 100

## 2019-06-02 NOTE — TOC Progression Note (Signed)
Transition of Care [place]) - Progression Note    Patient Details  Name: [name] MRN: [id number] Date of Birth: June 25, 1940  Transition of [facility]) CM/SW Contact  Armanda Heritage, RN Phone Number: 06/02/2019, 11:52 AM  Clinical Narrative:   CM received update that patient is now full comfort measures and per MD expect patient will pass in the hospital.    Expected Discharge Plan: Skilled Nursing Facility Barriers to Discharge: Continued Medical Work up  Expected Discharge Plan and Services Expected Discharge Plan: Skilled Nursing Facility   Discharge Planning Services: CM Consult Post Acute Care Choice: Resumption of Svcs/PTA Provider Living arrangements for the past 2 months: Skilled Nursing Facility                 DME Arranged: N/A DME Agency: NA       HH Arranged: NA HH Agency: NA         Social Determinants of Health (SDOH) Interventions    Readmission Risk Interventions No flowsheet data found.

## 2019-06-02 NOTE — TOC Initial Note (Signed)
Transition of Care [facility]) - Initial/Assessment Note    Patient Details  Name: [name] MRN: [id number] Date of Birth: 07-26-1940  Transition [facility]) CM/SW Contact:    Armanda Heritage, RN Phone Number: 06/02/2019, 9:57 AM  Clinical Narrative:  Patient is a long term resident at [facility].  Per facility rep, patient can return when medically stable to dc from hospital.  CM faxed updated FL2 to facility. Facility will need dc summary on day of discharge.                   Expected Discharge Plan: Skilled Nursing Facility Barriers to Discharge: Continued Medical Work up   Patient Goals and CMS Choice Patient states their goals for this hospitalization and ongoing recovery are:: to return to [facility]      Expected Discharge Plan and Services Expected Discharge Plan: Skilled Nursing Facility   Discharge Planning Services: CM Consult Post Acute Care Choice: Resumption of Svcs/PTA Provider Living arrangements for the past 2 months: Skilled Nursing Facility                 DME Arranged: N/A DME Agency: NA       HH Arranged: NA HH Agency: NA        Prior Living Arrangements/Services Living arrangements for the past 2 months: Skilled Nursing Facility Lives with:: Facility Resident Patient language and need for interpreter reviewed:: Yes(nonverbal) Do you feel safe going back to the place where you live?: Yes      Need for Family Participation in Patient Care: Yes (Comment) Care giver support system in place?: Yes (comment)   Criminal Activity/Legal Involvement Pertinent to Current Situation/Hospitalization: No - Comment as needed  Activities of Daily Living Home Assistive Devices/Equipment: Hospital bed ADL Screening (condition at time of admission) Patient's cognitive ability adequate to safely complete daily activities?: No Is the patient deaf or have difficulty hearing?: No Does the patient have difficulty seeing, even when wearing  glasses/contacts?: No Does the patient have difficulty concentrating, remembering, or making decisions?: Yes Patient able to express need for assistance with ADLs?: No Does the patient have difficulty dressing or bathing?: Yes Independently performs ADLs?: No Communication: Dependent Is this a change from baseline?: Pre-admission baseline Dressing (OT): Dependent Is this a change from baseline?: Pre-admission baseline Grooming: Dependent Is this a change from baseline?: Pre-admission baseline Feeding: Dependent Is this a change from baseline?: Pre-admission baseline Bathing: Dependent Is this a change from baseline?: Pre-admission baseline Toileting: Dependent Is this a change from baseline?: Pre-admission baseline In/Out Bed: Dependent Is this a change from baseline?: Pre-admission baseline Walks in Home: Dependent Is this a change from baseline?: Pre-admission baseline Does the patient have difficulty walking or climbing stairs?: Yes Weakness of Legs: Both Weakness of Arms/Hands: None  Permission Sought/Granted                  Emotional Assessment       Orientation: : Fluctuating Orientation (Suspected and/or reported Sundowners)(disoriented x4)   Psych Involvement: No (comment)  Admission diagnosis:  Hypernatremia [E87.0] COVID-19 [U07.1] Patient Active Problem List   Diagnosis Date Noted  . Pressure injury of skin 05/31/2019  . Sepsis (HCC) 05/31/2019  . Enterococcal bacteremia 05/31/2019  . Hypernatremia 05/27/2019  . Dehydration 05/10/2019  . Lactic acidosis 05/13/2019  . COVID-19 virus infection 05/16/2019  . AKI (acute kidney injury) (HCC) 05/08/2019  . Acute respiratory distress syndrome (ARDS) due to COVID-19 virus (HCC) 05/06/2019  . Chest pressure  06/06/2015  . Syncope 06/05/2015  . 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 DVT (deep venous thrombosis), left 05/22/2015  . Left leg DVT ([place]) 05/22/2015  . Bradycardia 05/16/2015  .  UTI (urinary tract infection) 05/11/2015  . 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 Stroke with cerebral ischemia ([place])   . Weakness 05/08/2015  . TIA (transient ischemic attack) 05/08/2015  . Dementia with behavioral disturbance ([place]) 11/20/2013  . Agitation 11/20/2013  . Hypertension   . Hyperlipidemia   . Stroke due to thrombosis of left middle cerebral artery ([place])   . Coronary artery disease    PCP:  Donald Prose, MD Pharmacy:  No Pharmacies Listed    Social Determinants of Health (SDOH) Interventions    Readmission Risk Interventions No flowsheet data found.

## 2019-06-02 NOTE — Plan of Care (Addendum)
Talked with Daughter on phone to update and discuss POC and transition to comfort care.  Set up family visit for 3 people (all children) - Approved by Chrg and AC.  Dicussed expectations of visit with daughter and time set for 1530 today. AC,Chrg and Dr. Informed of visit time.

## 2019-06-02 NOTE — Discharge Planning (Signed)
Daughter called and was upset/angry that she was not able to get mom's personal belongings from [facility].  Apparently, they were informed that patient was returning  - but daughter has filed a neglect claim against the facility and DOES NOT want her to return to their care.  Under "normal circumstances" CM informed me that patient would have to return to facility if deemed medically stable to do so.  This AM patient was changed to comfort care status, is NPO, non-responsive and on a morphine drip. Currently, Dr. Jarrett Ables this patient may even pass in the hospital - but says she will qualify for  In-house hospice if needed. CM notified facility and also informed that daughter would be by to pick up patient's belongings.  Daughter contacted and left message to inform of current DC plans and that belongings could now be picked up.

## 2019-06-02 NOTE — Progress Notes (Addendum)
[facility] PROGRESS NOTE    Progress Note  [name]  ZOX:[id number] DOB: [birth year] DOA: 2019/06/18 PCP: Deatra James, MD     Brief Narrative:   [name] is an 79 y.o. female past medical history significant for ischemic stroke, residual facal weakness, advanced dementia,  essential hypertension and DVT who was from [facility] nursing facility as she as she became high toxic.  She was recently diagnosed with COVID-19 on May 13, 2019 she has not had any symptoms until the day prior to admission when she was found to be satting 60% on room air.  Patient is confused most of the history was obtained from the chart and family members.  Assessment/Plan:   Acute respiratory failure with hypoxia due to acute respiratory distress syndrome due to COVID-19 and possibly COVID-19 viral infection: She remains on 2 L satting 100%,  After having a long conversation with the family they decided to move towards comfort care.  They would like IV morphine started and any other medications to keep her comfortable, they also requested that all her lab draws line or imaging be discontinued.  Sepsis due to Proteus bacteremia: In the setting of sacral decubitus ulcer, blood culture ID reflex panel was positive for Proteus She was started on IV Rocephin as family decided to move towards comfort care this was discontinued.   Acute kidney injury/dehydration: Prerenal azotemia with a baseline of around 1 on admission 1.6 she was started on D5W due to her hyponatremia see below for further details.  Discontinue IV fluids.  Severe hypovolemic hypernatremia: In the setting of sepsis and significant decreased oral intake she was started on D5W her sodium is improving nicely continue D5W at current rate for the next 24 hours. Continue IV fluids as per family request  New hypokalemia: Continue IV fluids her potassium is 3.4 we will try to keep above 4.  History of stroke: Currently bedbound  continue Eliquis and statins.  Acute confusional state superimposed and probably acute metabolic encephalopathy advanced dementia: Likely due to infectious etiology, the patient continues to be nonverbal.  History of DVT: On Eliquis.  Essential hypertension: Antihypertensive medications were held due to soft blood pressure.  Ethics/goals of care: After having an extensive discussion with the family, they related that they would like to move towards comfort cares as we think her outcome will be extremely poor. They requested all her labs and imaging to be discontinued and will use medications for comfort.  Stage III sacral decubitus ulcer present on admission: Consult wound care.    RN Pressure Injury Documentation: Pressure Injury Jun 18, 2019 Sacrum Mid Stage 3 -  Full thickness tissue loss. Subcutaneous fat may be visible but bone, tendon or muscle are NOT exposed. (Active)  06/18/2019 1900  Location: Sacrum  Location Orientation: Mid  Staging: Stage 3 -  Full thickness tissue loss. Subcutaneous fat may be visible but bone, tendon or muscle are NOT exposed.  Wound Description (Comments):   Present on Admission: Yes    Estimated body mass index is 17.18 kg/m as calculated from the following:   Height as of this encounter: 5\' 4"  (1.626 m).   Weight as of this encounter: 45.4 kg.   DVT prophylaxis: Lovenox Family Communication:none Disposition Plan/Barrier to D/C: Patient probably was passed away in house  I spent over 45 minutes speaking to the family over the phone. Code Status:     Code Status Orders  (From admission, onward)         Start  Ordered   05/02/2019 1703  Do not attempt resuscitation (DNR)  Continuous    Question Answer Comment  In the event of cardiac or respiratory ARREST Do not call a "code blue"   In the event of cardiac or respiratory ARREST Do not perform Intubation, CPR, defibrillation or ACLS   In the event of cardiac or respiratory ARREST Use  medication by any route, position, wound care, and other measures to relive pain and suffering. May use oxygen, suction and manual treatment of airway obstruction as needed for comfort.      05/07/2019 1703        Code Status History    Date Active Date Inactive Code Status Order ID Comments User Context   05/23/2019 1639 05/07/2019 1703 DNR [id number]  Burnadette Pop, MD ED   06/06/2015 0136 06/08/2015 1703 Full Code [id number]  Bobette Mo, MD Inpatient   06/06/2015 0107 06/06/2015 0136 Full Code [id number]  Bobette Mo, MD Inpatient   05/22/2015 0505 05/25/2015 0144 Full Code [id number]  Clydie Braun, MD ED   05/08/2015 2300 05/10/2015 2336 Full Code [id number]  Ron Parker, MD ED   Advance Care Planning Activity    Advance Directive Documentation     Most Recent Value  Type of Advance Directive  Healthcare Power of Attorney  Pre-existing out of facility DNR order (yellow form or pink MOST form)  -  "MOST" Form in Place?  -        IV Access:    Peripheral IV   Procedures and diagnostic studies:   No results found.   Medical Consultants:    None.  Anti-Infectives:   none  Subjective:    [name] nonverbal  Objective:    Vitals:   06/01/19 0724 06/01/19 1100 06/01/19 1611 06/01/19 2017  BP: 95/62 95/62 103/68 116/90  Pulse: 72 72 67 (!) 54  Resp: 16 16 16  (!) 21  Temp:  97.8 F (36.6 C) 98 F (36.7 C) 98.6 F (37 C)  TempSrc:    Axillary  SpO2: 100%  100% 100%  Weight:  45.4 kg    Height:  5\' 4"  (1.626 m)     SpO2: 100 % O2 Flow Rate (L/min): 2 L/min   Intake/Output Summary (Last 24 hours) at 06/02/2019 0645 Last data filed at 06/02/2019 0500 Gross per 24 hour  Intake 1200 ml  Output -  Net 1200 ml   Filed Weights   05/31/19 0154 06/01/19 1100  Weight: 45.4 kg 45.4 kg    Exam: General exam: Lethargic, nonverbal. Respiratory system: Good air movement and diffuse crackles bilaterally Cardiovascular system: S1 & S2 heard, RRR.  No JVD. Central nervous system: Only responding to painful stimuli Extremities: No pedal edema. Skin: No rashes, lesions or ulcers  Data Reviewed:    Labs: Basic Metabolic Panel: Recent Labs  Lab 05/31/19 0500 05/31/19 0500 05/31/19 0840 05/31/19 0840 05/31/19 1233 05/31/19 1233 06/01/19 0055 06/02/19 0250  NA 163*  --  164*  --  162*  --  159* 153*  K 3.3*   < > 3.3*   < > 3.8   < > 3.2* 3.5  CL 129*  --  127*  --  127*  --  125* 119*  CO2 24  --  24  --  23  --  26 24  GLUCOSE 228*  --  202*  --  118*  --  154* 141*  BUN 57*  --  56*  --  50*  --  51* 40*  CREATININE 1.22*  --  1.18*  --  1.07*  --  1.05* 0.89  CALCIUM 8.3*  --  8.1*  --  8.3*  --  8.1* 8.6*   < > = values in this interval not displayed.   GFR Estimated Creatinine Clearance: 36.7 mL/min (by C-G formula based on SCr of 0.89 mg/dL). Liver Function Tests: Recent Labs  Lab 05/26/2019 1243 05/31/19 0500  AST 69* 60*  ALT 64* 51*  ALKPHOS 86 71  BILITOT 1.0 0.3  PROT 8.2* 6.5  ALBUMIN 2.9* 2.3*   No results for input(s): LIPASE, AMYLASE in the last 168 hours. No results for input(s): AMMONIA in the last 168 hours. Coagulation profile No results for input(s): INR, PROTIME in the last 168 hours. COVID-19 Labs  Recent Labs    05/18/2019 1243 05/31/19 0500 06/01/19 0055  DDIMER 1.26* 0.88* 1.09*  FERRITIN 228 234  --   LDH 303*  --   --   CRP 18.9* 14.0* 8.1*    No results found for: SARSCOV2NAA  CBC: Recent Labs  Lab 05/08/2019 1243 05/31/19 0500 06/01/19 0055 06/02/19 0250  WBC 23.2* 19.6* 15.2* 14.3*  NEUTROABS 19.0* 17.8* 13.7* 13.1*  HGB 15.1* 13.0 10.8* 12.0  HCT 52.3* 43.3 36.3 41.0  MCV 99.4 98.2 98.4 97.6  PLT 402* 283 223 220   Cardiac Enzymes: No results for input(s): CKTOTAL, CKMB, CKMBINDEX, TROPONINI in the last 168 hours. BNP (last 3 results) No results for input(s): PROBNP in the last 8760 hours. CBG: Recent Labs  Lab 06/01/19 0723 06/01/19 0740 06/01/19 1126  06/01/19 1609 06/01/19 1925  GLUCAP 157* 137* 105* 112* 145*   D-Dimer: Recent Labs    05/31/19 0500 06/01/19 0055  DDIMER 0.88* 1.09*   Hgb A1c: No results for input(s): HGBA1C in the last 72 hours. Lipid Profile: Recent Labs    05/03/2019 1243  TRIG 127   Thyroid function studies: No results for input(s): TSH, T4TOTAL, T3FREE, THYROIDAB in the last 72 hours.  Invalid input(s): FREET3 Anemia work up: Recent Labs    05/23/2019 1243 05/31/19 0500  FERRITIN 228 234   Sepsis Labs: Recent Labs  Lab 05/21/2019 1243 05/31/19 0500 06/01/19 0055 06/02/19 0250  PROCALCITON 0.20  --   --   --   WBC 23.2* 19.6* 15.2* 14.3*  LATICACIDVEN 6.3* 1.7  --   --    Microbiology Recent Results (from the past 240 hour(s))  Blood Culture (routine x 2)     Status: None (Preliminary result)   Collection Time: 05/02/2019 12:43 PM   Specimen: BLOOD  Result Value Ref Range Status   Specimen Description   Final    BLOOD LEFT ANTECUBITAL Performed at [facility], [address], [place], [place] [address]    Special Requests   Final    BOTTLES DRAWN AEROBIC AND ANAEROBIC Blood Culture results may not be optimal due to an inadequate volume of blood received in culture bottles Performed at [facility], [address] [place], [place] [address]    Culture   Final    NO GROWTH 2 DAYS Performed at [facility], [address], [place], [place] [address]    Report Status PENDING  Incomplete  Blood Culture (routine x 2)     Status: Abnormal (Preliminary result)   Collection Time: 05/14/2019 12:57 PM   Specimen: BLOOD  Result Value Ref Range Status   Specimen Description   Final    BLOOD  RIGHT ARM Performed at [facility], [place] [address], [place], [place] [address]    Special Requests   Final    BOTTLES DRAWN AEROBIC ONLY Blood Culture adequate volume Performed at [place] [address]  [place], [place] [address]    Culture  Setup Time   Final    GRAM NEGATIVE RODS AEROBIC BOTTLE ONLY CRITICAL RESULT CALLED TO, READ BACK BY AND VERIFIED WITH: K. Grimsley, AT 1208 05/31/19 BY D.[facility] Performed at [place] [address], [place], [place] [address]    Culture PROTEUS MIRABILIS (A)  Final   Report Status PENDING  Incomplete  Blood Culture ID Panel (Reflexed)     Status: Abnormal   Collection Time: 06/01/2019 12:57 PM  Result Value Ref Range Status   Enterococcus species NOT DETECTED NOT DETECTED Final   Listeria monocytogenes NOT DETECTED NOT DETECTED Final   Staphylococcus species NOT DETECTED NOT DETECTED Final   Staphylococcus aureus (BCID) NOT DETECTED NOT DETECTED Final   Streptococcus species NOT DETECTED NOT DETECTED Final   Streptococcus agalactiae NOT DETECTED NOT DETECTED Final   Streptococcus pneumoniae NOT DETECTED NOT DETECTED Final   Streptococcus pyogenes NOT DETECTED NOT DETECTED Final   Acinetobacter baumannii NOT DETECTED NOT DETECTED Final   Enterobacteriaceae species DETECTED (A) NOT DETECTED Final    Comment: Enterobacteriaceae represent a large family of gram-negative bacteria, not a single organism. CRITICAL RESULT CALLED TO, READ BACK BY AND VERIFIED WITH: K. HAMMONS PHARMD, AT 1208 05/31/19 BY D.[facility]    Enterobacter cloacae complex NOT DETECTED NOT DETECTED Final   Escherichia coli NOT DETECTED NOT DETECTED Final   Klebsiella oxytoca NOT DETECTED NOT DETECTED Final   Klebsiella pneumoniae NOT DETECTED NOT DETECTED Final   Proteus species DETECTED (A) NOT DETECTED Final    Comment: CRITICAL RESULT CALLED TO, READ BACK BY AND VERIFIED WITH: K. HAMMONS PHARMD, AT 1208 05/31/19 BY D.[facility]    Serratia marcescens NOT DETECTED NOT DETECTED Final   Carbapenem resistance NOT DETECTED NOT DETECTED Final   Haemophilus influenzae NOT DETECTED NOT DETECTED Final   Neisseria meningitidis NOT DETECTED NOT DETECTED Final   Pseudomonas  aeruginosa NOT DETECTED NOT DETECTED Final   Candida albicans NOT DETECTED NOT DETECTED Final   Candida glabrata NOT DETECTED NOT DETECTED Final   Candida krusei NOT DETECTED NOT DETECTED Final   Candida parapsilosis NOT DETECTED NOT DETECTED Final   Candida tropicalis NOT DETECTED NOT DETECTED Final    Comment: Performed at [facility], [place] [address] [place], [place] [address]     Medications:   . apixaban  5 mg Oral BID  . vitamin C  500 mg Oral BID  . atorvastatin  40 mg Oral Daily  . feeding supplement  1 Container Oral TID BM  . free water  400 mL Per Tube Q8H  . insulin aspart  0-9 Units Subcutaneous TID WC  . methylPREDNISolone (SOLU-MEDROL) injection  40 mg Intravenous Q12H  . zinc sulfate  220 mg Oral Daily   Continuous Infusions: . cefTRIAXone (ROCEPHIN)  IV 2 g (06/01/19 1528)  . dextrose 5 % 1,000 mL with potassium chloride 40 mEq infusion 75 mL/hr at 06/01/19 1109  . dextrose 75 mL/hr at 06/01/19 0738  . remdesivir 100 mg in NS 100 mL 100 mg (06/01/19 1050)      LOS: 3 days   Charlynne Cousins  [facility]  06/02/2019, 6:45 AM

## 2019-06-02 NOTE — NC FL2 (Signed)
  [place] MEDICAID FL2 LEVEL OF CARE SCREENING TOOL     IDENTIFICATION  Patient Name: [name] Birthdate: 08/02/1940 Sex: female Admission Date (Current Location): 05/26/2019  [facility] and [place] Number:  Producer, television/film/video and Address:  [facility], [address] [place], [place] [address]([facility])      Provider Number: [id number]  Attending Physician Name and Address:  Marinda Elk, MD  Relative Name and Phone Number:       Current Level of Care: Hospital Recommended Level of Care: Skilled Nursing Facility Prior Approval Number:    Date Approved/Denied:   PASRR Number: [id number]  Discharge Plan: SNF    Current Diagnoses: Patient Active Problem List   Diagnosis Date Noted  . Pressure injury of skin 05/31/2019  . Sepsis (HCC) 05/31/2019  . Enterococcal bacteremia 05/31/2019  . Hypernatremia 05/15/2019  . Dehydration 05/26/2019  . Lactic acidosis 05/04/2019  . COVID-19 virus infection 06/01/2019  . AKI (acute kidney injury) (HCC) 05/29/2019  . Acute respiratory distress syndrome (ARDS) due to COVID-19 virus (HCC) 05/06/2019  . Chest pressure 06/06/2015  . Syncope 06/05/2015  . 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 DVT (deep venous thrombosis), left 05/22/2015  . Left leg DVT (HCC) 05/22/2015  . Bradycardia 05/16/2015  . UTI (urinary tract infection) 05/11/2015  . 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 Stroke with cerebral ischemia (HCC)   . Weakness 05/08/2015  . TIA (transient ischemic attack) 05/08/2015  . Dementia with behavioral disturbance (HCC) 11/20/2013  . Agitation 11/20/2013  . Hypertension   . Hyperlipidemia   . 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 Coronary artery disease     Orientation RESPIRATION BLADDER Height & Weight     (disoriented x4)  Normal Incontinent Weight: 45.4 kg Height:  5\' 4"  (162.6 cm)  BEHAVIORAL SYMPTOMS/MOOD  NEUROLOGICAL BOWEL NUTRITION STATUS      Incontinent Diet  AMBULATORY STATUS COMMUNICATION OF NEEDS Skin   Total Care Does not communicate PU Stage and Appropriate Care(Right and left buttocks)     PU Stage 3 Dressing: BID                 Personal Care Assistance Level of Assistance  Bathing, Dressing, Feeding, Total care Bathing Assistance: Maximum assistance Feeding assistance: Limited assistance Dressing Assistance: Maximum assistance Total Care Assistance: Maximum assistance   Functional Limitations Info             SPECIAL CARE FACTORS FREQUENCY                       Contractures Contractures Info: Not present    Additional Factors Info  Code Status, Allergies Code Status Info: DNR Allergies Info: seroquel           Current Medications (06/02/2019):  This is the current hospital active medication list Current Facility-Administered Medications  Medication Dose Route Frequency Provider Last Rate Last Admin  . collagenase (SANTYL) ointment   Topical Daily 07/31/2019, MD      . morphine 100mg  in NS Marinda Elk (1mg /mL) infusion - premix  1-20 mg/hr Intravenous Continuous , MD 2 mL/hr at 06/02/19 0855 2 mg/hr at 06/02/19 0855     Discharge Medications: Please see discharge summary for a list of discharge medications.  Relevant Imaging Results:  Relevant Lab Results:   Additional Information SSN Marinda Elk  07/31/19, RN

## 2019-06-02 NOTE — Consult Note (Addendum)
WOC Nurse Consult Note: Reason for Consult: Consult requested for bilat buttocks/sacrum; performed remotely after review of the photos in the EMR and progress notes.  Pt is critically ill with multiple systemic factors which can impair healing and has Covid.   Wound type: Bilat buttocks with 2 unstageable pressure injuries; 100% tightly adhered eschar. Sacrum with stage 2 pressure injury, pink and moist.  Pressure Injury POA: Yes Dressing procedure/placement/frequency: Progress notes indicate that primary team is talking to the family regarding possible comfort care goals.  If patient becomes stable and is eventually transferred out of [facility] and aggressive plan of care is desired; then she could benefit from a surgical consult for possible debridement at that time.  Topical treatment orders provided for bedside nurses to perform daily to provide enzymatic debridement and assist with removal of nonviable tissue as follows: Apply Santyl to bilat buttocks wounds Q day, then cover with moist gauze dressings and ABD pad and tape. Please re-consult if further assistance is needed.  Thank-you,  Cammie Mcgee MSN, RN, CWOCN, [place], CNS [phone]

## 2019-06-02 DEATH — deceased

## 2019-06-03 DIAGNOSIS — J9601 Acute respiratory failure with hypoxia: Secondary | ICD-10-CM

## 2019-06-03 DIAGNOSIS — B952 Enterococcus as the cause of diseases classified elsewhere: Secondary | ICD-10-CM

## 2019-06-03 NOTE — Progress Notes (Signed)
Called daughter Carlyle Basques to provide update on pt status. Update provided. All questions/concerns addressed. [name] provided preferred funeral home to this RN to have on file when pt passes. [facility] at [employer] [place], [place]. Nothing further to note.

## 2019-06-03 NOTE — Progress Notes (Signed)
[facility] PROGRESS NOTE    Progress Note  [name]  XTA:[id number] DOB: 1940-05-27 DOA: 06-22-19 PCP: Deatra James, MD     Brief Narrative:   [name] is an 79 y.o. female past medical history significant for ischemic stroke, residual facal weakness, advanced dementia,  essential hypertension and DVT who was from [facility] nursing facility as she as she became high toxic.  She was recently diagnosed with COVID-19 on May 13, 2019 she has not had any symptoms until the day prior to admission when she was found to be satting 60% on room air.  Patient is confused most of the history was obtained from the chart and family members.  Assessment/Plan:   Acute respiratory failure with hypoxia due to acute respiratory distress syndrome due to COVID-19 and possibly COVID-19 viral infection: She is currently satting 100% on room air. She is currently on IV morphine as the family decided to move towards comfort care. He appears comfortable on my physical examination. We will continue the patient on morphine drip as all of her medications not dedicated to comfort have been discontinued.  The patient will probably pass away in house.  Sepsis due to Proteus bacteremia in the setting of a stage IV sacral decubitus ulcer: In the setting of sacral decubitus ulcer with a blood culture positive for Proteus she was started empirically on IV Rocephin after family decided to move towards comfort care empiric antibiotics were discontinued try to keep the patient comfortable.  Acute kidney injury/dehydration: Severe hypovolemic hypernatremia: New hypokalemia: History of stroke: Acute confusional state superimposed and probably acute metabolic encephalopathy advanced dementia: History of DVT: Essential hypertension: Ethics/goals of care: After having an extensive discussion with the family, they related that they would like to move towards comfort cares as we think her outcome will  be extremely poor. They requested all her labs and imaging to be discontinued and will use medications for comfort.  Stage III sacral decubitus ulcer present on admission: Consult wound care.    RN Pressure Injury Documentation: Pressure Injury 22-Jun-2019 Buttocks Right;Left Unstageable - Full thickness tissue loss in which the base of the injury is covered by slough (yellow, tan, gray, green or brown) and/or eschar (tan, brown or black) in the wound bed. bilat buttocks with 2 are (Active)  06/22/2019 1900  Location: Buttocks  Location Orientation: Right;Left  Staging: Unstageable - Full thickness tissue loss in which the base of the injury is covered by slough (yellow, tan, gray, green or brown) and/or eschar (tan, brown or black) in the wound bed.  Wound Description (Comments): bilat buttocks with 2 areas of unstageable pressure injuries, stage 2 to sacrum in the middle  Present on Admission: Yes    Estimated body mass index is 17.18 kg/m as calculated from the following:   Height as of this encounter: 5\' 4"  (1.626 m).   Weight as of this encounter: 45.4 kg.   DVT prophylaxis: Lovenox Family Communication:none Disposition Plan/Barrier to D/C: Patient probably was passed away in house   Code Status:     Code Status Orders  (From admission, onward)         Start     Ordered   06-22-19 1703  Do not attempt resuscitation (DNR)  Continuous    Question Answer Comment  In the event of cardiac or respiratory ARREST Do not call a "code blue"   In the event of cardiac or respiratory ARREST Do not perform Intubation, CPR, defibrillation or ACLS   In  the event of cardiac or respiratory ARREST Use medication by any route, position, wound care, and other measures to relive pain and suffering. May use oxygen, suction and manual treatment of airway obstruction as needed for comfort.      05/20/2019 1703        Code Status History    Date Active Date Inactive Code Status Order ID Comments  User Context   05/20/2019 1639 05/04/2019 1703 DNR [id number]  Burnadette Pop, MD ED   06/06/2015 0136 06/08/2015 1703 Full Code [id number]  Bobette Mo, MD Inpatient   06/06/2015 0107 06/06/2015 0136 Full Code [id number]  Bobette Mo, MD Inpatient   05/22/2015 0505 05/25/2015 0144 Full Code [id number]  Clydie Braun, MD ED   05/08/2015 2300 05/10/2015 2336 Full Code [id number]  Ron Parker, MD ED   Advance Care Planning Activity    Advance Directive Documentation     Most Recent Value  Type of Advance Directive  Healthcare Power of Attorney  Pre-existing out of facility DNR order (yellow form or pink MOST form)  -  "MOST" Form in Place?  -        IV Access:    Peripheral IV   Procedures and diagnostic studies:   No results found.   Medical Consultants:    None.  Anti-Infectives:   none  Subjective:    Marci Polito nonverbal  Objective:    Vitals:   06/01/19 1100 06/01/19 1611 06/01/19 2017 06/02/19 0800  BP: 95/62 103/68 116/90 115/72  Pulse: 72 67 (!) 54   Resp: 16 16 (!) 21   Temp: 97.8 F (36.6 C) 98 F (36.7 C) 98.6 F (37 C) 98.4 F (36.9 C)  TempSrc:   Axillary Axillary  SpO2:  100% 100%   Weight: 45.4 kg     Height: 5\' 4"  (1.626 m)      SpO2: 100 % O2 Flow Rate (L/min): 2 L/min   Intake/Output Summary (Last 24 hours) at 06/03/2019 0720 Last data filed at 06/02/2019 1500 Gross per 24 hour  Intake 12.11 ml  Output -  Net 12.11 ml   Filed Weights   05/31/19 0154 06/01/19 1100  Weight: 45.4 kg 45.4 kg    Exam: General exam: In no acute distress. Respiratory system: Good air movement and clear to auscultation. Cardiovascular system: S1 & S2 heard, RRR. No JVD. Gastrointestinal system: Abdomen is nondistended, soft. Extremities: No pedal edema. Skin: No rashes, lesions or ulcers  Data Reviewed:    Labs: Basic Metabolic Panel: Recent Labs  Lab 05/31/19 0500 05/31/19 0500 05/31/19 0840 05/31/19 0840 05/31/19 1233  05/31/19 1233 06/01/19 0055 06/02/19 0250  NA 163*  --  164*  --  162*  --  159* 153*  K 3.3*   < > 3.3*   < > 3.8   < > 3.2* 3.5  CL 129*  --  127*  --  127*  --  125* 119*  CO2 24  --  24  --  23  --  26 24  GLUCOSE 228*  --  202*  --  118*  --  154* 141*  BUN 57*  --  56*  --  50*  --  51* 40*  CREATININE 1.22*  --  1.18*  --  1.07*  --  1.05* 0.89  CALCIUM 8.3*  --  8.1*  --  8.3*  --  8.1* 8.6*   < > = values in this interval not displayed.   GFR Estimated  Creatinine Clearance: 36.7 mL/min (by C-G formula based on SCr of 0.89 mg/dL). Liver Function Tests: Recent Labs  Lab 2019/06/06 1243 05/31/19 0500  AST 69* 60*  ALT 64* 51*  ALKPHOS 86 71  BILITOT 1.0 0.3  PROT 8.2* 6.5  ALBUMIN 2.9* 2.3*   No results for input(s): LIPASE, AMYLASE in the last 168 hours. No results for input(s): AMMONIA in the last 168 hours. Coagulation profile No results for input(s): INR, PROTIME in the last 168 hours. COVID-19 Labs  Recent Labs    06/01/19 0055 06/02/19 0250  DDIMER 1.09* 1.16*  CRP 8.1* 4.9*    No results found for: SARSCOV2NAA  CBC: Recent Labs  Lab 2019/06/06 1243 05/31/19 0500 06/01/19 0055 06/02/19 0250  WBC 23.2* 19.6* 15.2* 14.3*  NEUTROABS 19.0* 17.8* 13.7* 13.1*  HGB 15.1* 13.0 10.8* 12.0  HCT 52.3* 43.3 36.3 41.0  MCV 99.4 98.2 98.4 97.6  PLT 402* 283 223 220   Cardiac Enzymes: No results for input(s): CKTOTAL, CKMB, CKMBINDEX, TROPONINI in the last 168 hours. BNP (last 3 results) No results for input(s): PROBNP in the last 8760 hours. CBG: Recent Labs  Lab 06/01/19 0740 06/01/19 1126 06/01/19 1609 06/01/19 1925 06/02/19 0740  GLUCAP 137* 105* 112* 145* 129*   D-Dimer: Recent Labs    06/01/19 0055 06/02/19 0250  DDIMER 1.09* 1.16*   Hgb A1c: No results for input(s): HGBA1C in the last 72 hours. Lipid Profile: No results for input(s): CHOL, HDL, LDLCALC, TRIG, CHOLHDL, LDLDIRECT in the last 72 hours. Thyroid function studies: No  results for input(s): TSH, T4TOTAL, T3FREE, THYROIDAB in the last 72 hours.  Invalid input(s): FREET3 Anemia work up: No results for input(s): VITAMINB12, FOLATE, FERRITIN, TIBC, IRON, RETICCTPCT in the last 72 hours. Sepsis Labs: Recent Labs  Lab 06-06-2019 1243 05/31/19 0500 06/01/19 0055 06/02/19 0250  PROCALCITON 0.20  --   --   --   WBC 23.2* 19.6* 15.2* 14.3*  LATICACIDVEN 6.3* 1.7  --   --    Microbiology Recent Results (from the past 240 hour(s))  Blood Culture (routine x 2)     Status: None (Preliminary result)   Collection Time: 06-06-19 12:43 PM   Specimen: BLOOD  Result Value Ref Range Status   Specimen Description BLOOD LEFT ANTECUBITAL  Final   Special Requests   Final    BOTTLES DRAWN AEROBIC AND ANAEROBIC Blood Culture results may not be optimal due to an inadequate volume of blood received in culture bottles Performed at [facility], [place] [address], [place], [place] [address]    Culture NO GROWTH 4 DAYS  Final   Report Status PENDING  Incomplete  Blood Culture (routine x 2)     Status: Abnormal   Collection Time: 06-06-19 12:57 PM   Specimen: BLOOD  Result Value Ref Range Status   Specimen Description   Final    BLOOD RIGHT ARM Performed at [facility] [address], [place], [place] [address]    Special Requests   Final    BOTTLES DRAWN AEROBIC ONLY Blood Culture adequate volume Performed at [facility] [address], [place], [place] [address]    Culture  Setup Time   Final    GRAM NEGATIVE RODS AEROBIC BOTTLE ONLY CRITICAL RESULT CALLED TO, READ BACK BY AND VERIFIED WITH: K. [place], AT 1208 05/31/19 BY D.VA [facility] Performed at [facility] [address] [place], [place] [address]    Culture PROTEUS MIRABILIS (  A)  Final   Report Status 06/02/2019 FINAL  Final   Organism ID, Bacteria PROTEUS MIRABILIS  Final      Susceptibility   Proteus mirabilis - MIC*    AMPICILLIN <=2  SENSITIVE Sensitive     CEFAZOLIN <=4 SENSITIVE Sensitive     CEFEPIME <=0.12 SENSITIVE Sensitive     CEFTAZIDIME <=1 SENSITIVE Sensitive     CEFTRIAXONE <=0.25 SENSITIVE Sensitive     CIPROFLOXACIN <=0.25 SENSITIVE Sensitive     GENTAMICIN <=1 SENSITIVE Sensitive     IMIPENEM 2 SENSITIVE Sensitive     TRIMETH/SULFA <=20 SENSITIVE Sensitive     AMPICILLIN/SULBACTAM <=2 SENSITIVE Sensitive     PIP/TAZO <=4 SENSITIVE Sensitive     * PROTEUS MIRABILIS  Blood Culture ID Panel (Reflexed)     Status: Abnormal   Collection Time: 06/07/2019 12:57 PM  Result Value Ref Range Status   Enterococcus species NOT DETECTED NOT DETECTED Final   Listeria monocytogenes NOT DETECTED NOT DETECTED Final   Staphylococcus species NOT DETECTED NOT DETECTED Final   Staphylococcus aureus (BCID) NOT DETECTED NOT DETECTED Final   Streptococcus species NOT DETECTED NOT DETECTED Final   Streptococcus agalactiae NOT DETECTED NOT DETECTED Final   Streptococcus pneumoniae NOT DETECTED NOT DETECTED Final   Streptococcus pyogenes NOT DETECTED NOT DETECTED Final   Acinetobacter baumannii NOT DETECTED NOT DETECTED Final   Enterobacteriaceae species DETECTED (A) NOT DETECTED Final    Comment: Enterobacteriaceae represent a large family of gram-negative bacteria, not a single organism. CRITICAL RESULT CALLED TO, READ BACK BY AND VERIFIED WITH: K. HAMMONS PHARMD, AT 1208 05/31/19 BY D.VA [facility]    Enterobacter cloacae complex NOT DETECTED NOT DETECTED Final   Escherichia coli NOT DETECTED NOT DETECTED Final   Klebsiella oxytoca NOT DETECTED NOT DETECTED Final   Klebsiella pneumoniae NOT DETECTED NOT DETECTED Final   Proteus species DETECTED (A) NOT DETECTED Final    Comment: CRITICAL RESULT CALLED TO, READ BACK BY AND VERIFIED WITH: K. HAMMONS PHARMD, AT 1208 05/31/19 BY D.VA [facility]    Serratia marcescens NOT DETECTED NOT DETECTED Final   Carbapenem resistance NOT DETECTED NOT DETECTED Final   Haemophilus influenzae NOT  DETECTED NOT DETECTED Final   Neisseria meningitidis NOT DETECTED NOT DETECTED Final   Pseudomonas aeruginosa NOT DETECTED NOT DETECTED Final   Candida albicans NOT DETECTED NOT DETECTED Final   Candida glabrata NOT DETECTED NOT DETECTED Final   Candida krusei NOT DETECTED NOT DETECTED Final   Candida parapsilosis NOT DETECTED NOT DETECTED Final   Candida tropicalis NOT DETECTED NOT DETECTED Final    Comment: Performed at [facility], [address] [place], [place] [address]     Medications:   . collagenase   Topical Daily   Continuous Infusions: . morphine 2 mg/hr (06/03/19 0701)      LOS: 4 days   Marinda Elk  [facility]  06/03/2019, 7:20 AM

## 2019-06-03 NOTE — Progress Notes (Signed)
Pts daughter called and updated on plan of care.  

## 2019-06-04 DIAGNOSIS — J1282 Pneumonia due to coronavirus disease 2019: Secondary | ICD-10-CM | POA: Diagnosis present

## 2019-06-04 DIAGNOSIS — U071 COVID-19: Secondary | ICD-10-CM

## 2019-06-04 LAB — CULTURE, BLOOD (ROUTINE X 2): Culture: NO GROWTH

## 2019-06-04 NOTE — Progress Notes (Signed)
PROGRESS NOTE    [name]  SHF:[id number] DOB: 1941/02/16 DOA: 05/05/2019 PCP: Deatra James, MD   Brief Narrative:  [name] is an 79 y.o. WF from [facility]) PMHx  ischemic stroke, residual facal weakness, advanced dementia, essential HTN and DVT  she as she became high toxic.  She was recently diagnosed with COVID-19 on May 13, 2019 she has not had any symptoms until the day prior to admission when she was found to be satting 60% on room air.  Patient is confused most of the history was obtained from the chart and family members.   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)   Dementia with behavioral disturbance (HCC)   Left leg DVT (HCC)   Hypernatremia   Dehydration   Lactic acidosis   AKI (acute kidney injury) (HCC)   Acute respiratory distress syndrome (ARDS) due to COVID-19 virus (HCC)   Pressure injury of skin   Sepsis (HCC)   Enterococcal bacteremia   Pneumonia due to COVID-19 virus  Covid pneumonia/acute respiratory failure with hypoxia COVID-19 Labs  Recent Labs    06/02/19 0250  DDIMER 1.16*  CRP 4.9*    No results found for: SARSCOV2NAA COMFORT care  Sepsis due to Proteus bacteremia in the setting of a stage IV sacral decubitus ulcer: In the setting of sacral decubitus ulcer with a blood culture positive for Proteus she was started empirically on IV Rocephin after family decided to move towards comfort care empiric antibiotics were discontinued try to keep the patient comfortable.  Acute kidney injury/dehydration: Severe hypovolemic hypernatremia: New hypokalemia: History of stroke: Acute confusional state superimposed and probably acute metabolic encephalopathy advanced dementia: History of DVT: Essential hypertension:  Stage III sacral decubitus ulcer present on  admission: Consult wound care. Pressure Injury 05/24/2019 Buttocks Right;Left Unstageable - Full thickness tissue loss in which the base of the injury is covered by slough (yellow, tan, gray, green or brown) and/or eschar (tan, brown or black) in the wound bed. bilat buttocks with 2 are (Active)  05/29/2019 1900  Location: Buttocks  Location Orientation: Right;Left  Staging: Unstageable - Full thickness tissue loss in which the base of the injury is covered by slough (yellow, tan, gray, green or brown) and/or eschar (tan, brown or black) in the wound bed.  Wound Description (Comments): bilat buttocks with 2 areas of unstageable pressure injuries, stage 2 to sacrum in the middle  Present on Admission: Yes  COMFORT care  Ethics/goals of care: After having an extensive discussion with the family, they related that they would like to move towards comfort cares as we think her outcome will be extremely poor. They requested all her labs and imaging to be discontinued and will use medications for comfort. COMFORT care    DVT prophylaxis:  Code Status:  Family Communication:  Disposition Plan:    Consultants:    Procedures/Significant Events:     I have personally reviewed and interpreted all radiology studies and my findings are as above.  VENTILATOR SETTINGS:    Cultures   Antimicrobials:    Devices    LINES / TUBES:      Continuous Infusions: . morphine 2 mg/hr (06/03/19 1830)     Objective: Vitals:   06/03/19 0830 06/03/19 1300 06/03/19 1800 06/04/19 0749  BP:    (!) 86/61  Pulse:  94  Resp:  15  18  Temp:    (!) 96.2 F (35.7 C)  TempSrc:    Axillary  SpO2: 98% 96% 95% (!) 82%  Weight:      Height:        Intake/Output Summary (Last 24 hours) at 06/04/2019 0818 Last data filed at 06/03/2019 1830 Gross per 24 hour  Intake 54.66 ml  Output 500 ml  Net -445.34 ml   Filed Weights   05/31/19 0154 06/01/19 1100  Weight: 45.4 kg 45.4 kg     Examination:  General: Unresponsive but appears comfortable.   .     Data Reviewed: Care during the described time interval was provided by me .  I have reviewed this patient's available data, including medical history, events of note, physical examination, and all test results as part of my evaluation.   CBC: Recent Labs  Lab 05/29/2019 1243 05/31/19 0500 06/01/19 0055 06/02/19 0250  WBC 23.2* 19.6* 15.2* 14.3*  NEUTROABS 19.0* 17.8* 13.7* 13.1*  HGB 15.1* 13.0 10.8* 12.0  HCT 52.3* 43.3 36.3 41.0  MCV 99.4 98.2 98.4 97.6  PLT 402* 283 223 220   Basic Metabolic Panel: Recent Labs  Lab 05/31/19 0500 05/31/19 0840 05/31/19 1233 06/01/19 0055 06/02/19 0250  NA 163* 164* 162* 159* 153*  K 3.3* 3.3* 3.8 3.2* 3.5  CL 129* 127* 127* 125* 119*  CO2 24 24 23 26 24   GLUCOSE 228* 202* 118* 154* 141*  BUN 57* 56* 50* 51* 40*  CREATININE 1.22* 1.18* 1.07* 1.05* 0.89  CALCIUM 8.3* 8.1* 8.3* 8.1* 8.6*   GFR: Estimated Creatinine Clearance: 36.7 mL/min (by C-G formula based on SCr of 0.89 mg/dL). Liver Function Tests: Recent Labs  Lab 05/08/2019 1243 05/31/19 0500  AST 69* 60*  ALT 64* 51*  ALKPHOS 86 71  BILITOT 1.0 0.3  PROT 8.2* 6.5  ALBUMIN 2.9* 2.3*   No results for input(s): LIPASE, AMYLASE in the last 168 hours. No results for input(s): AMMONIA in the last 168 hours. Coagulation Profile: No results for input(s): INR, PROTIME in the last 168 hours. Cardiac Enzymes: No results for input(s): CKTOTAL, CKMB, CKMBINDEX, TROPONINI in the last 168 hours. BNP (last 3 results) No results for input(s): PROBNP in the last 8760 hours. HbA1C: No results for input(s): HGBA1C in the last 72 hours. CBG: Recent Labs  Lab 06/01/19 0740 06/01/19 1126 06/01/19 1609 06/01/19 1925 06/02/19 0740  GLUCAP 137* 105* 112* 145* 129*   Lipid Profile: No results for input(s): CHOL, HDL, LDLCALC, TRIG, CHOLHDL, LDLDIRECT in the last 72 hours. Thyroid Function Tests: No results  for input(s): TSH, T4TOTAL, FREET4, T3FREE, THYROIDAB in the last 72 hours. Anemia Panel: No results for input(s): VITAMINB12, FOLATE, FERRITIN, TIBC, IRON, RETICCTPCT in the last 72 hours. Urine analysis:    Component Value Date/Time   COLORURINE YELLOW 06/05/2015 2305   APPEARANCEUR CLOUDY (A) 06/05/2015 2305   LABSPEC 1.024 06/05/2015 2305   PHURINE 5.0 06/05/2015 2305   GLUCOSEU NEGATIVE 06/05/2015 2305   HGBUR SMALL (A) 06/05/2015 2305   BILIRUBINUR NEGATIVE 06/05/2015 2305   KETONESUR NEGATIVE 06/05/2015 2305   PROTEINUR NEGATIVE 06/05/2015 2305   UROBILINOGEN 0.2 11/04/2011 1102   NITRITE NEGATIVE 06/05/2015 2305   LEUKOCYTESUR NEGATIVE 06/05/2015 2305   Sepsis Labs: @LABRCNTIP (procalcitonin:4,lacticidven:4)  ) Recent Results (from the past 240 hour(s))  Blood Culture (routine x 2)     Status: None (Preliminary result)   Collection Time: 05/20/2019 12:43 PM   Specimen: BLOOD  Result  Value Ref Range Status   Specimen Description BLOOD LEFT ANTECUBITAL  Final   Special Requests   Final    BOTTLES DRAWN AEROBIC AND ANAEROBIC Blood Culture results may not be optimal due to an inadequate volume of blood received in culture bottles Performed at [facility], [address], [place], [place] [address]    Culture NO GROWTH 4 DAYS  Final   Report Status PENDING  Incomplete  Blood Culture (routine x 2)     Status: Abnormal   Collection Time: 05/07/2019 12:57 PM   Specimen: BLOOD  Result Value Ref Range Status   Specimen Description   Final    BLOOD RIGHT ARM Performed at [facility], [address], [place], [place] [address]    Special Requests   Final    BOTTLES DRAWN AEROBIC ONLY Blood Culture adequate volume Performed at [facility], [address] [place], [place] [address]    Culture  Setup Time   Final    GRAM NEGATIVE RODS AEROBIC BOTTLE ONLY CRITICAL RESULT CALLED TO, READ BACK BY AND VERIFIED WITH: K.  HAMMONS PHARMD, AT 1208 05/31/19 BY D.VA [facility] Performed at [facility], [address], [place], [place] [address]    Culture PROTEUS MIRABILIS (A)  Final   Report Status 06/02/2019 FINAL  Final   Organism ID, Bacteria PROTEUS MIRABILIS  Final      Susceptibility   Proteus mirabilis - MIC*    AMPICILLIN <=2 SENSITIVE Sensitive     CEFAZOLIN <=4 SENSITIVE Sensitive     CEFEPIME <=0.12 SENSITIVE Sensitive     CEFTAZIDIME <=1 SENSITIVE Sensitive     CEFTRIAXONE <=0.25 SENSITIVE Sensitive     CIPROFLOXACIN <=0.25 SENSITIVE Sensitive     GENTAMICIN <=1 SENSITIVE Sensitive     IMIPENEM 2 SENSITIVE Sensitive     TRIMETH/SULFA <=20 SENSITIVE Sensitive     AMPICILLIN/SULBACTAM <=2 SENSITIVE Sensitive     PIP/TAZO <=4 SENSITIVE Sensitive     * PROTEUS MIRABILIS  Blood Culture ID Panel (Reflexed)     Status: Abnormal   Collection Time: 05/04/2019 12:57 PM  Result Value Ref Range Status   Enterococcus species NOT DETECTED NOT DETECTED Final   Listeria monocytogenes NOT DETECTED NOT DETECTED Final   Staphylococcus species NOT DETECTED NOT DETECTED Final   Staphylococcus aureus (BCID) NOT DETECTED NOT DETECTED Final   Streptococcus species NOT DETECTED NOT DETECTED Final   Streptococcus agalactiae NOT DETECTED NOT DETECTED Final   Streptococcus pneumoniae NOT DETECTED NOT DETECTED Final   Streptococcus pyogenes NOT DETECTED NOT DETECTED Final   Acinetobacter baumannii NOT DETECTED NOT DETECTED Final   Enterobacteriaceae species DETECTED (A) NOT DETECTED Final    Comment: Enterobacteriaceae represent a large family of gram-negative bacteria, not a single organism. CRITICAL RESULT CALLED TO, READ BACK BY AND VERIFIED WITH: K. HAMMONS PHARMD, AT 1208 05/31/19 BY D.VA [facility]    Enterobacter cloacae complex NOT DETECTED NOT DETECTED Final   Escherichia coli NOT DETECTED NOT DETECTED Final   Klebsiella oxytoca NOT DETECTED NOT DETECTED Final   Klebsiella pneumoniae NOT DETECTED NOT DETECTED  Final   Proteus species DETECTED (A) NOT DETECTED Final    Comment: CRITICAL RESULT CALLED TO, READ BACK BY AND VERIFIED WITH: K. HAMMONS PHARMD, AT 1208 05/31/19 BY D.VA NHOOK    Serratia marcescens NOT DETECTED NOT DETECTED Final   Carbapenem resistance NOT DETECTED NOT DETECTED Final   Haemophilus influenzae NOT DETECTED NOT DETECTED Final   Neisseria  meningitidis NOT DETECTED NOT DETECTED Final   Pseudomonas aeruginosa NOT DETECTED NOT DETECTED Final   Candida albicans NOT DETECTED NOT DETECTED Final   Candida glabrata NOT DETECTED NOT DETECTED Final   Candida krusei NOT DETECTED NOT DETECTED Final   Candida parapsilosis NOT DETECTED NOT DETECTED Final   Candida tropicalis NOT DETECTED NOT DETECTED Final    Comment: Performed at [facility], [place] [address] [place], [place] [address]         Radiology Studies: No results found.      Scheduled Meds: . collagenase   Topical Daily   Continuous Infusions: . morphine 2 mg/hr (06/03/19 1830)     LOS: 5 days   The patient is critically ill with multiple organ systems failure and requires high complexity decision making for assessment and support, frequent evaluation and titration of therapies, application of advanced monitoring technologies and extensive interpretation of multiple databases. Critical Care Time devoted to patient care services described in this note  Time spent: 40 minutes     Taiwan Millon, Geraldo Docker, MD [facility] Pager [phone]  If 7PM-7AM, please contact night-coverage [email or web address] Password [facility] 06/04/2019, 8:18 AM

## 2019-06-05 DIAGNOSIS — J1282 Pneumonia due to coronavirus disease 2019: Secondary | ICD-10-CM

## 2019-06-05 DIAGNOSIS — J80 Acute respiratory distress syndrome: Secondary | ICD-10-CM

## 2019-06-30 NOTE — Progress Notes (Signed)
Time of death 1:27 Doctor on duty and relative made aware. Pt body was taken to the morgue no pt belonging noted. Post mortem notes documented.

## 2019-06-30 NOTE — Progress Notes (Signed)
waSted 36 mls of IV  Morphine witnessed by Tarri Fuller RN

## 2019-06-30 NOTE — Death Summary Note (Signed)
Death Summary  [name] GHW:[id number] DOB: 1941/01/11 DOA: Jun 17, 2019  PCP: No primary care provider on file. 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  No results found for: [id number]  COMFORT care  Sepsis due to Proteus bacteremiain the setting of a stage IV sacral decubitus ulcer: In the setting of sacral decubitus ulcer with a blood culture positive for Proteus she was started empirically on IV Rocephin after family decided to move towards comfort care empiric antibiotics were discontinued try to keep the patient comfortable.  Acute kidney injury/dehydration: Severe hypovolemic hypernatremia: New hypokalemia: History of stroke: Acute confusional state superimposed and probably acute metabolic encephalopathy advanced dementia: History of DVT: Essential hypertension:  Stage III sacral decubitus ulcer present on admission Pressure Injury 17-Jun-2019 Buttocks Right;Left Unstageable - Full thickness tissue loss in which the base of the injury is covered by slough (yellow, tan, gray, green or brown) and/or eschar (tan, brown or black) in the wound bed. bilat buttocks with 2 are (Active)  2019/06/17 1900  Location: Buttocks  Location Orientation: Right;Left  Staging: Unstageable - Full thickness tissue loss in which the  base of the injury is covered by slough (yellow, tan, gray, green or brown) and/or eschar (tan, brown or black) in the wound bed.  Wound Description (Comments): bilat buttocks with 2 areas of unstageable pressure injuries, stage 2 to sacrum in the middle  Present on Admission: Yes     History of present illness:  [name] an 79 y.o.WF from [facility]) PMHx  ischemic stroke, residual facal weakness, advanced dementia, essential HTN and DVT  she as she became high toxic. She was recently diagnosed with COVID-19 on May 13, 2019 she has not had any symptoms until the day prior to admission when she was found to be satting 60% on room air. Patient is confused most of the history was obtained from the chart and family members.   Hospital Course:  Upon taking over care of patient EMR indicated following goals of care; After having an extensive discussion with the family, they related that they would like to move towards comfort cares as we think her outcome will be extremely poor. They requested all her labs and imaging to be discontinued and will use medications for comfort. COMFORT care    Time: 1327  Signed:  Carolyne Littles, MD [facility] [phone]

## 2019-06-30 DEATH — deceased

## 2021-11-22 IMAGING — CT CT CHEST W/O CM
1 of 2 series · 15 of 32 positions shown, 19 images · non-contrast
Comparison: Radiograph earlier this day.

CLINICAL DATA: Respiratory failure. COVID positive.

EXAM:
CT CHEST WITHOUT CONTRAST
TECHNIQUE: Multidetector CT imaging of the chest was performed following the
standard protocol without IV contrast.
Patient was confused and combative and had difficulty tolerating the
exam.

[Series 3: routine chest w/o · axial · non-contrast · 0.61mm/px · z∈[-315,-55]mm · 15 of 62 slices shown, 19 images]
[im 5/62  mediastinal]
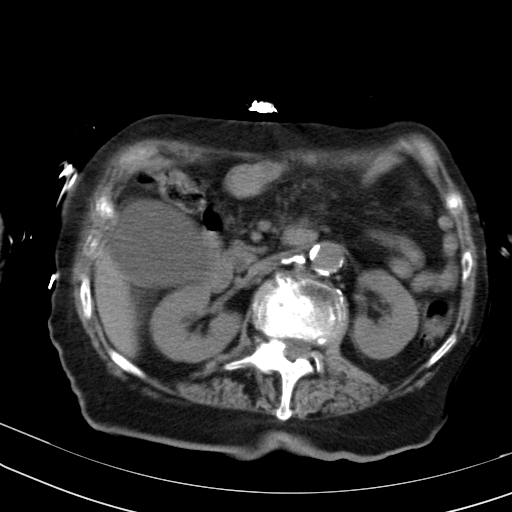
[im 5/62  lung]
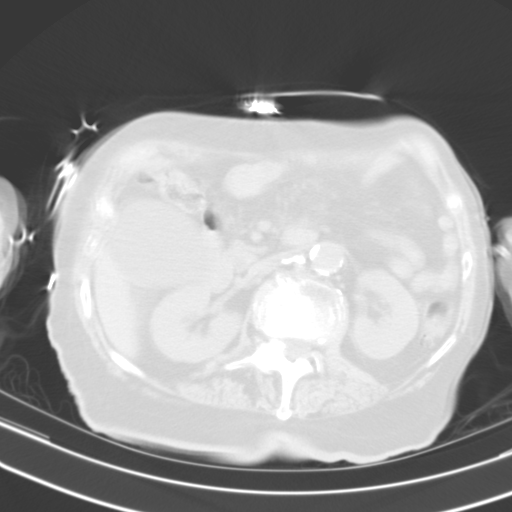
[im 9/62  lung]
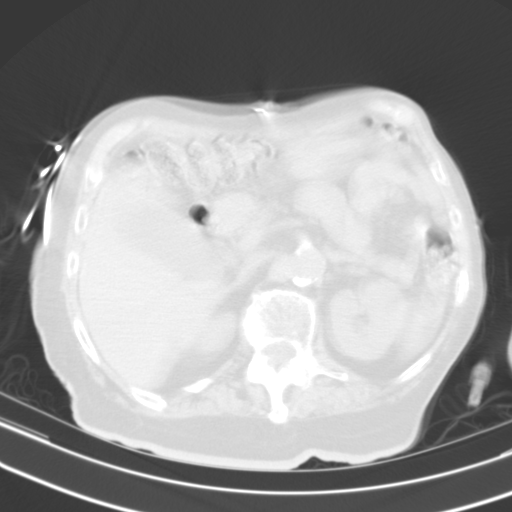
[im 13/62  lung]
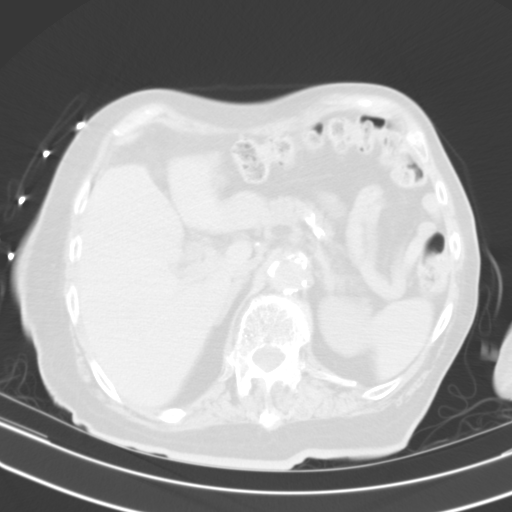
[im 17/62  lung]
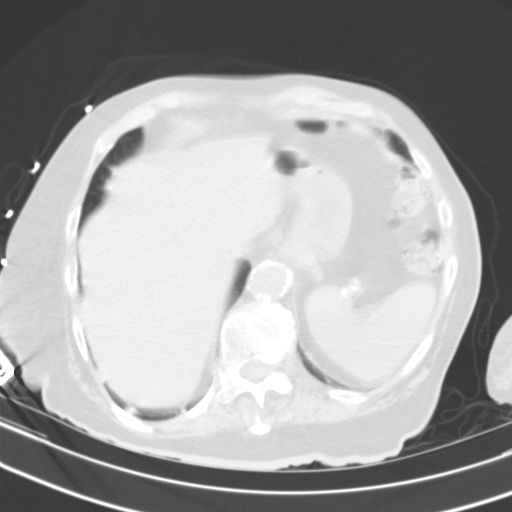
[im 21/62  mediastinal]
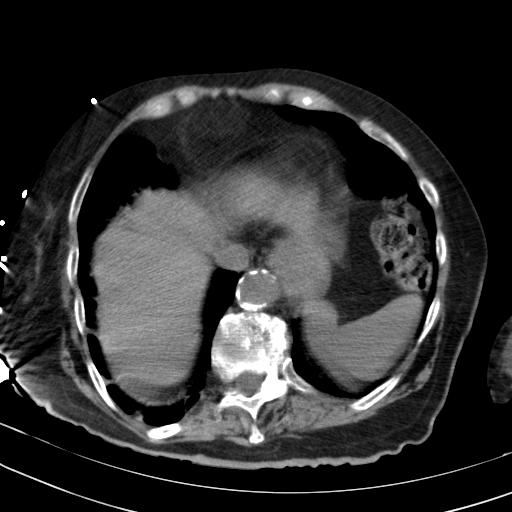
[im 21/62  lung]
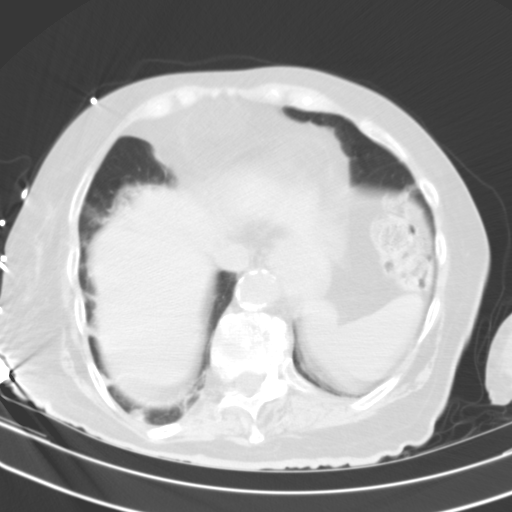
[im 25/62  lung]
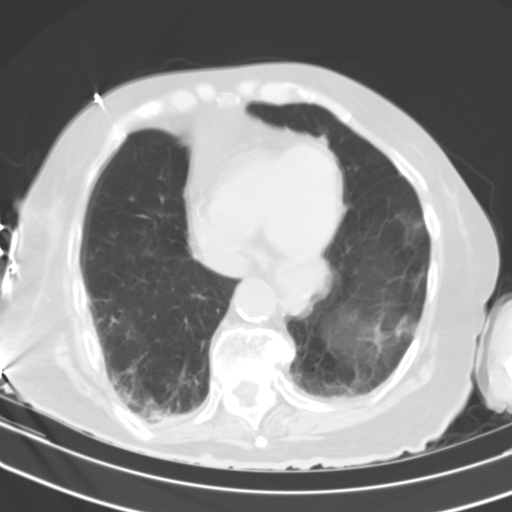
[im 29/62  lung]
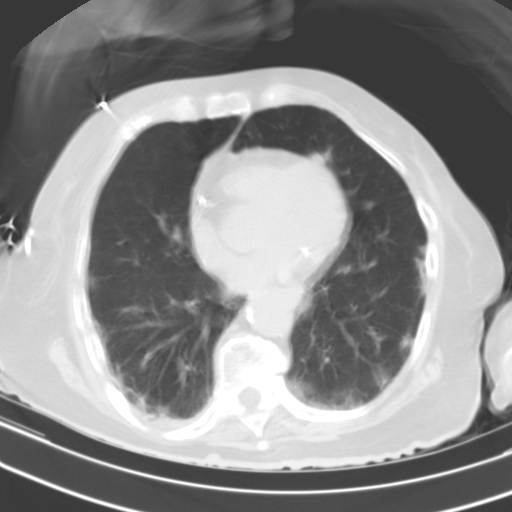
[im 31/62  lung]
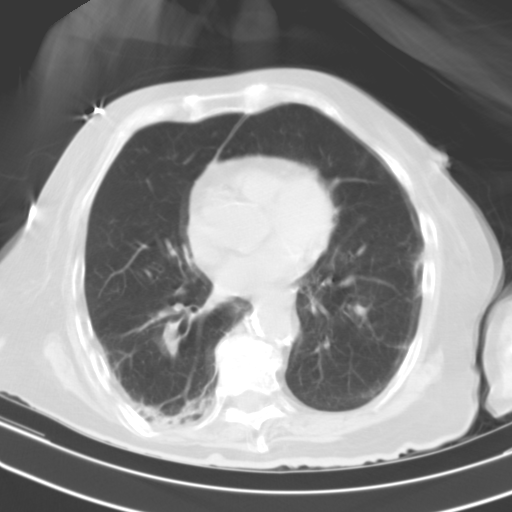
[im 33/62  mediastinal]
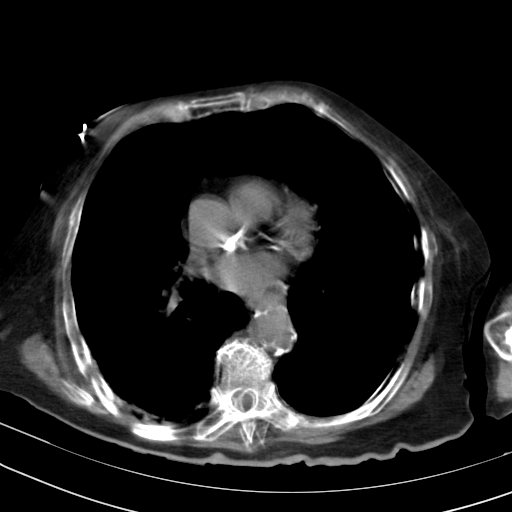
[im 33/62  lung]
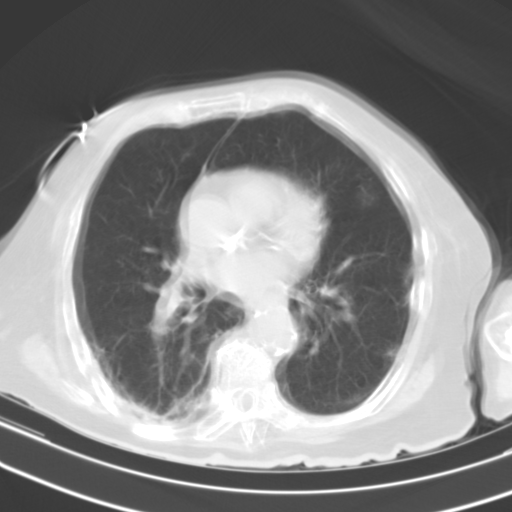
[im 37/62  lung]
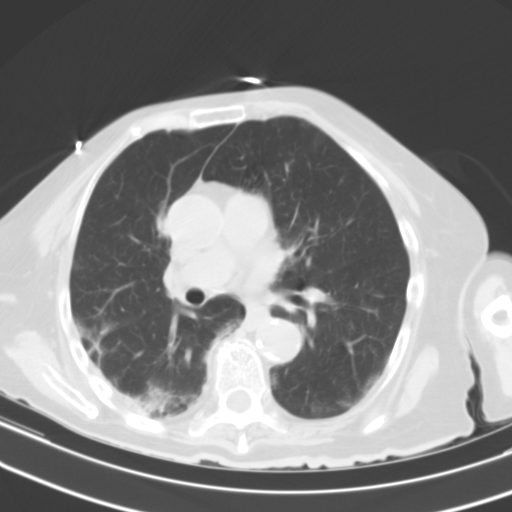
[im 41/62  lung]
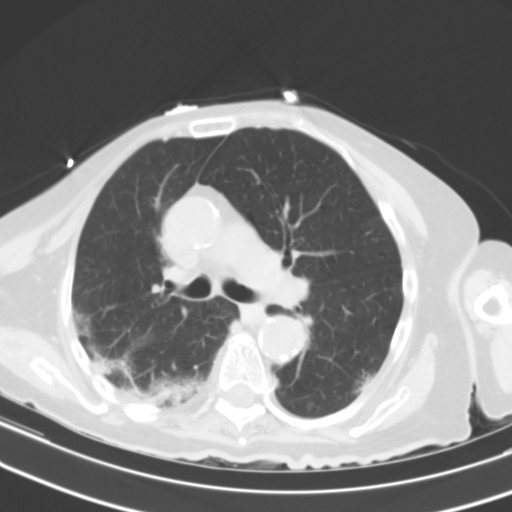
[im 45/62  lung]
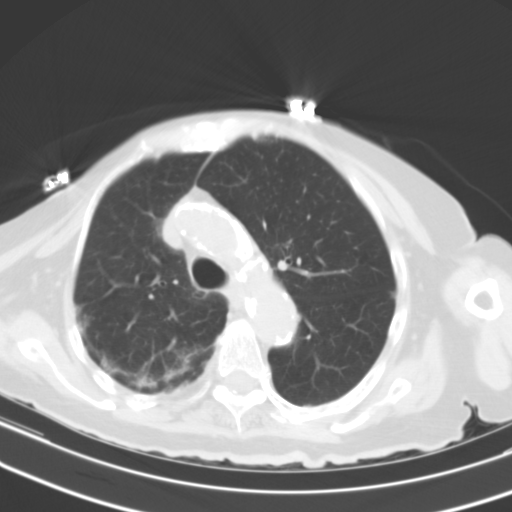
[im 49/62  mediastinal]
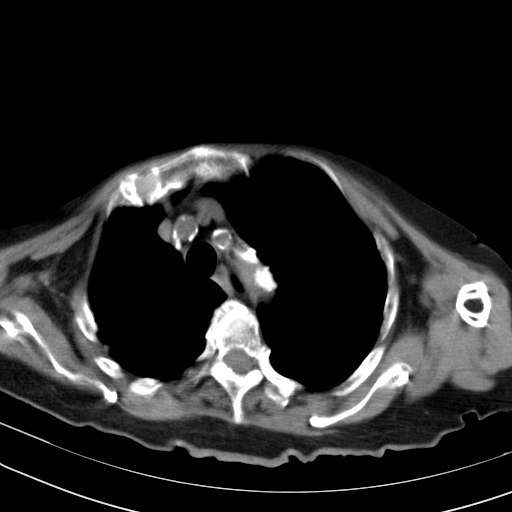
[im 49/62  lung]
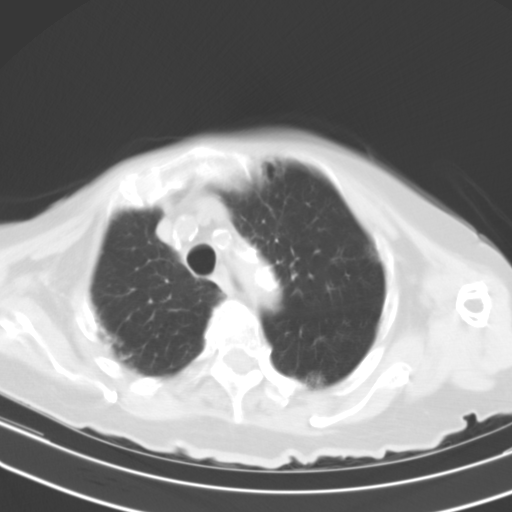
[im 53/62  lung]
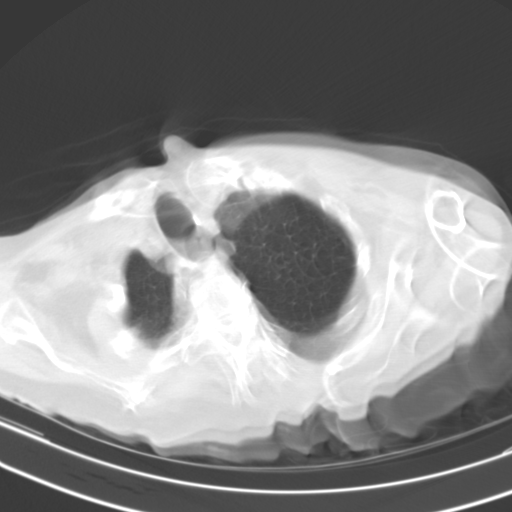
[im 57/62  lung]
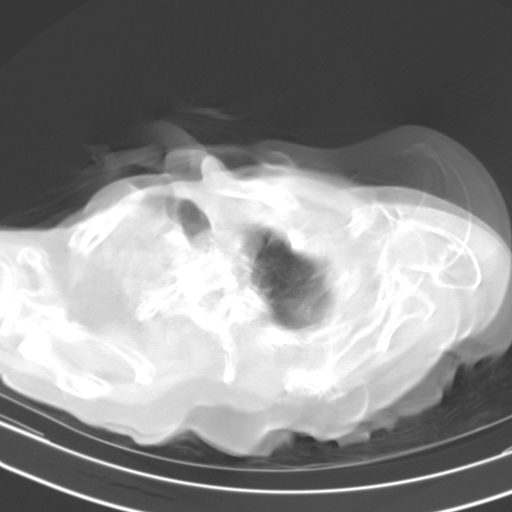

[15 of 32 positions shown; findings below may reference images not displayed]

FINDINGS: Moderate motion artifact limits detailed assessment.

Cardiovascular: Aortic atherosclerosis and tortuosity. No aortic
aneurysm. Heart is normal in size. There are coronary artery
calcifications. No pericardial effusion.

Mediastinum/Nodes: Moderate hiatal hernia. No obvious adenopathy,
allowing for motion. Thyroid gland is obscured. Anterior
diaphragmatic defect with herniation of upper abdominal fat anterior
to the right heart, no associated inflammation.

Lungs/Pleura: Pulmonary evaluation significantly limited by motion.
Scattered bilateral ground-glass opacities in a peripheral
predominant distribution, greatest within the right upper and lower
lobes. Minimal pleural thickening without frank pleural effusion.
Lower trachea and central mainstem bronchi are patent, upper trachea
obscured.

Upper Abdomen: Gallbladder appears distended, more detailed
evaluation is limited. Significant motion artifact in the upper
abdomen. There is upper abdominal atherosclerosis.

Musculoskeletal: Scoliosis and degenerative change in the spine.
More detailed osseous assessment is limited by motion.
IMPRESSION: 1. Scattered bilateral ground-glass opacities in a peripheral
predominant distribution, greatest within the right upper and lower
lobes. Findings are most consistent with [id number] pneumonia.
Parenchymal involvement appears mild. Significant patient motion
artifact limits detailed assessment.
2. Moderate hiatal hernia.
3. Advanced aortic atherosclerosis.  Coronary artery calcifications.

Aortic Atherosclerosis ([id number]).
